# Patient Record
Sex: Male | Born: 1975 | Race: White | Hispanic: No | Marital: Married | Smoking: Current every day smoker
Health system: Southern US, Community
[De-identification: ages and names within clinical notes are randomized; demographics above are authoritative.]

## PROBLEM LIST (undated history)

## (undated) DIAGNOSIS — I2510 Atherosclerotic heart disease of native coronary artery without angina pectoris: Secondary | ICD-10-CM

## (undated) DIAGNOSIS — I5189 Other ill-defined heart diseases: Secondary | ICD-10-CM

## (undated) DIAGNOSIS — I129 Hypertensive chronic kidney disease with stage 1 through stage 4 chronic kidney disease, or unspecified chronic kidney disease: Secondary | ICD-10-CM

## (undated) DIAGNOSIS — K219 Gastro-esophageal reflux disease without esophagitis: Secondary | ICD-10-CM

## (undated) DIAGNOSIS — J4540 Moderate persistent asthma, uncomplicated: Secondary | ICD-10-CM

## (undated) DIAGNOSIS — G4730 Sleep apnea, unspecified: Secondary | ICD-10-CM

## (undated) DIAGNOSIS — E785 Hyperlipidemia, unspecified: Secondary | ICD-10-CM

## (undated) DIAGNOSIS — R053 Chronic cough: Secondary | ICD-10-CM

## (undated) DIAGNOSIS — L02215 Cutaneous abscess of perineum: Secondary | ICD-10-CM

## (undated) DIAGNOSIS — E119 Type 2 diabetes mellitus without complications: Secondary | ICD-10-CM

## (undated) DIAGNOSIS — I10 Essential (primary) hypertension: Secondary | ICD-10-CM

## (undated) DIAGNOSIS — F329 Major depressive disorder, single episode, unspecified: Secondary | ICD-10-CM

## (undated) HISTORY — DX: Essential (primary) hypertension: I10

## (undated) HISTORY — DX: Hyperlipidemia, unspecified: E78.5

## (undated) HISTORY — PX: VASECTOMY: SHX75

## (undated) HISTORY — DX: Cutaneous abscess of perineum: L02.215

## (undated) HISTORY — DX: Type 2 diabetes mellitus without complications: E11.9

## (undated) HISTORY — DX: Major depressive disorder, single episode, unspecified: F32.9

## (undated) HISTORY — DX: Sleep apnea, unspecified: G47.30

## (undated) HISTORY — PX: CHOLECYSTECTOMY: SHX55

---

## 2007-07-08 ENCOUNTER — Emergency Department: Payer: Self-pay | Admitting: Emergency Medicine

## 2010-12-20 ENCOUNTER — Ambulatory Visit: Payer: Self-pay | Admitting: Family Medicine

## 2011-12-19 ENCOUNTER — Ambulatory Visit: Payer: Self-pay | Admitting: Family Medicine

## 2014-10-05 ENCOUNTER — Other Ambulatory Visit: Payer: Self-pay | Admitting: Family Medicine

## 2014-10-05 DIAGNOSIS — E785 Hyperlipidemia, unspecified: Secondary | ICD-10-CM

## 2014-10-05 MED ORDER — CRESTOR 20 MG PO TABS: 20.0000 mg | ORAL_TABLET | Freq: Every day | ORAL | Status: DC

## 2014-10-07 ENCOUNTER — Telehealth: Payer: Self-pay

## 2014-10-07 NOTE — Telephone Encounter (Signed)
Form signed and faxed

## 2014-10-07 NOTE — Telephone Encounter (Signed)
OK form signed

## 2014-10-07 NOTE — Telephone Encounter (Signed)
Fax from [employer] requesting that patient's Crestor be written as Generic

## 2015-04-26 ENCOUNTER — Other Ambulatory Visit: Payer: Self-pay | Admitting: Family Medicine

## 2015-04-27 NOTE — Telephone Encounter (Signed)
apt 

## 2015-05-22 ENCOUNTER — Encounter: Payer: Self-pay | Admitting: Family Medicine

## 2015-07-12 ENCOUNTER — Ambulatory Visit (INDEPENDENT_AMBULATORY_CARE_PROVIDER_SITE_OTHER): Payer: Commercial Managed Care - PPO | Admitting: Family Medicine

## 2015-07-12 ENCOUNTER — Encounter: Payer: Self-pay | Admitting: Family Medicine

## 2015-07-12 VITALS — BP 115/81 | HR 111 | Temp 98.5°F | Ht 69.3 in | Wt 256.0 lb

## 2015-07-12 DIAGNOSIS — K611 Rectal abscess: Secondary | ICD-10-CM | POA: Diagnosis not present

## 2015-07-12 HISTORY — PX: INCISE AND DRAIN ABCESS: PRO64

## 2015-07-12 NOTE — Progress Notes (Signed)
   BP 115/81 mmHg  Pulse 111  Temp(Src) 98.5 F (36.9 C)  Ht 5' 9.3" (1.76 m)  Wt 256 lb (116.121 kg)  BMI 37.49 kg/m2  SpO2 96%   Subjective:    Patient ID: Casey Cole, male    DOB: [birth year], 40 y.o.   MRN: [id number]  HPI: Casey Cole is a 40 y.o. male  Chief Complaint  Patient presents with  . cyst on buttock   patient with left gluteal cheek several inches from his anus cyst which is been draining chronically over the last 3-4 weeks we'll fill up not hurt drain again and repeat  Patient with no other illness or problems has been doing well no constipation no blood in stool or urine   Relevant past medical, surgical, family and social history reviewed and updated as indicated. Interim medical history since our last visit reviewed. Allergies and medications reviewed and updated.  Review of Systems  Constitutional: Negative.   Respiratory: Negative.   Cardiovascular: Negative.     Per HPI unless specifically indicated above     Objective:    BP 115/81 mmHg  Pulse 111  Temp(Src) 98.5 F (36.9 C)  Ht 5' 9.3" (1.76 m)  Wt 256 lb (116.121 kg)  BMI 37.49 kg/m2  SpO2 96%  Wt Readings from Last 3 Encounters:  07/12/15 256 lb (116.121 kg)  08/29/14 260 lb (117.935 kg)    Physical Exam  Constitutional: He is oriented to person, place, and time. He appears well-developed and well-nourished. No distress.  HENT:  Head: Normocephalic and atraumatic.  Right Ear: Hearing normal.  Left Ear: Hearing normal.  Nose: Nose normal.  Eyes: Conjunctivae and lids are normal. Right eye exhibits no discharge. Left eye exhibits no discharge. No scleral icterus.  Pulmonary/Chest: Effort normal. No respiratory distress.  Musculoskeletal: Normal range of motion.  Neurological: He is alert and oriented to person, place, and time.  Skin: Skin is intact. No rash noted.  Gluteal abscess prepped with Betadine and alcohol and infiltrated with Xylocaine incision done with large amount of  purulent material expressed Elza Rafter inserted Patient education given on wound care will recheck tomorrow for wick removal and reinsertion.  Psychiatric: He has a normal mood and affect. His speech is normal and behavior is normal. Judgment and thought content normal. Cognition and memory are normal.    No results found for this or any previous visit.    Assessment & Plan:   Problem List Items Addressed This Visit    None       Follow up plan: Return for tomorrow.

## 2015-07-13 ENCOUNTER — Encounter: Payer: Self-pay | Admitting: Family Medicine

## 2015-07-13 ENCOUNTER — Ambulatory Visit (INDEPENDENT_AMBULATORY_CARE_PROVIDER_SITE_OTHER): Payer: Commercial Managed Care - PPO | Admitting: Family Medicine

## 2015-07-13 VITALS — BP 114/76 | HR 118 | Temp 98.1°F | Ht 68.3 in | Wt 256.0 lb

## 2015-07-13 DIAGNOSIS — K611 Rectal abscess: Secondary | ICD-10-CM

## 2015-07-13 NOTE — Progress Notes (Signed)
   BP 114/76 mmHg  Pulse 118  Temp(Src) 98.1 F (36.7 C)  Ht 5' 8.3" (1.735 m)  Wt 256 lb (116.121 kg)  BMI 38.58 kg/m2   Subjective:    Patient ID: Casey Cole, male    DOB: 02-28-1976, 40 y.o.   MRN: [id number]  HPI: Casey Cole is a 40 y.o. male  Chief Complaint  Patient presents with  . wick change   Patient with good drainage wick removed and reinserted no increased redness or irritation  Relevant past medical, surgical, family and social history reviewed and updated as indicated. Interim medical history since our last visit reviewed. Allergies and medications reviewed and updated.  Review of Systems  Per HPI unless specifically indicated above     Objective:    BP 114/76 mmHg  Pulse 118  Temp(Src) 98.1 F (36.7 C)  Ht 5' 8.3" (1.735 m)  Wt 256 lb (116.121 kg)  BMI 38.58 kg/m2  Wt Readings from Last 3 Encounters:  07/13/15 256 lb (116.121 kg)  07/12/15 256 lb (116.121 kg)  08/29/14 260 lb (117.935 kg)    Physical Exam  No results found for this or any previous visit.    Assessment & Plan:   Problem List Items Addressed This Visit      Other   Perirectal abscess - Primary    Abscess improving good drainage wick removed will recheck tomorrow          Follow up plan: Return for Tomorrow wick removal and further evaluation.

## 2015-07-13 NOTE — Assessment & Plan Note (Signed)
Abscess improving good drainage wick removed will recheck tomorrow

## 2015-07-14 ENCOUNTER — Ambulatory Visit: Payer: Commercial Managed Care - PPO | Admitting: Family Medicine

## 2015-07-14 ENCOUNTER — Ambulatory Visit (INDEPENDENT_AMBULATORY_CARE_PROVIDER_SITE_OTHER): Payer: Commercial Managed Care - PPO | Admitting: Family Medicine

## 2015-07-14 ENCOUNTER — Encounter: Payer: Self-pay | Admitting: Family Medicine

## 2015-07-14 VITALS — BP 124/84 | HR 118 | Temp 98.3°F | Wt 262.0 lb

## 2015-07-14 DIAGNOSIS — K611 Rectal abscess: Secondary | ICD-10-CM

## 2015-07-14 NOTE — Progress Notes (Signed)
Elza Rafter changed today. No sign of redness or swelling, no pus able to be expressed. Call if getting red or painful. Will not need wick change over the weekend.

## 2015-07-17 ENCOUNTER — Telehealth: Payer: Self-pay | Admitting: Family Medicine

## 2015-07-17 DIAGNOSIS — K611 Rectal abscess: Secondary | ICD-10-CM

## 2015-07-17 NOTE — Telephone Encounter (Signed)
Call pt 

## 2015-07-17 NOTE — Telephone Encounter (Signed)
Pt says his infection isn't any better and would like to have an antibiotice sent to [facility].

## 2015-07-17 NOTE — Telephone Encounter (Signed)
Phone call Patient still with pus draining from abscess site and perirectal area  Concerned might be rectal fistula We will make an appointment with surgery needs to be morning if it's tomorrow or Wednesday otherwise on Thursday all day is open.

## 2015-07-18 ENCOUNTER — Ambulatory Visit (INDEPENDENT_AMBULATORY_CARE_PROVIDER_SITE_OTHER): Payer: Commercial Managed Care - PPO | Admitting: General Surgery

## 2015-07-18 ENCOUNTER — Encounter: Payer: Self-pay | Admitting: General Surgery

## 2015-07-18 VITALS — BP 128/74 | HR 76 | Resp 12 | Ht 68.0 in | Wt 263.0 lb

## 2015-07-18 DIAGNOSIS — L0231 Cutaneous abscess of buttock: Secondary | ICD-10-CM | POA: Diagnosis not present

## 2015-07-18 MED ORDER — AMOXICILLIN-POT CLAVULANATE 875-125 MG PO TABS: 1.0000 | ORAL_TABLET | Freq: Two times a day (BID) | ORAL | Status: AC

## 2015-07-18 NOTE — Patient Instructions (Signed)
The patient is aware to call back for any questions or concerns.  

## 2015-07-18 NOTE — Progress Notes (Signed)
Patient ID: Casey Cole, male   DOB: 29-Jul-1975, 40 y.o.   MRN: [id number]  Chief Complaint  Patient presents with  . Rectal Problems    abscess    HPI Casey Cole is a 40 y.o. male.  Here today for evaluation of a possible perirectal abscess. He states it is the left gluteal cheek and has been draining on and off for the last 4 weeks. He had an incison and drainage with wick inserted by Dr Jeananne Rama on 07-12-15.  I reviewed the patient's history.   HPI  Past Medical History  Diagnosis Date  . Sleep apnea   . Diabetes mellitus without complication ([place])   . Hyperlipidemia   . Hypertension     Past Surgical History  Procedure Laterality Date  . Cholecystectomy    . Incise and drain abcess Left 07-12-15    left buttock    Family History  Problem Relation Age of Onset  . Stroke Paternal Grandfather   . Heart attack Paternal Grandfather   . Hypertension Father     Social History Social History  Substance Use Topics  . Smoking status: Current Every Day Smoker -- 1.00 packs/day    Types: Cigarettes  . Smokeless tobacco: Never Used  . Alcohol Use: Yes    No Known Allergies  Current Outpatient Prescriptions  Medication Sig Dispense Refill  . benazepril (LOTENSIN) 20 MG tablet Take 20 mg by mouth daily.    . Dapagliflozin-Metformin HCl ER (XIGDUO XR) 02-999 MG TB24 Take by mouth.    . ONE TOUCH ULTRA TEST test strip USE AS DIRECTED ONCE DAILY 100 each 12  . amoxicillin-clavulanate (AUGMENTIN) 875-125 MG tablet Take 1 tablet by mouth every 12 (twelve) hours. 20 tablet 0   No current facility-administered medications for this visit.    Review of Systems Review of Systems  Constitutional: Negative.   Respiratory: Negative.   Cardiovascular: Negative.     Blood pressure 128/74, pulse 76, resp. rate 12, height 5\' 8"  (1.727 m), weight 263 lb (119.296 kg).  Physical Exam Physical Exam  Genitourinary:       Data Reviewed PCP notes of earlier this  month.   Assessment    Gluteal abscess, low suspicion for anal fistula at this time.     Plan    Augmentin, 875 BID x 10 days.  Yogurt or probiotic daily.  Hot compress/ heating pad. Encouraged to take oral hypoglycemic meds as instructed.  Re-apply for CPAP coverage. Will consider exam under anesthesia if the area does not resolve.      PCP:  Golden Pop A This information has been scribed by Karie Fetch RNBC.   Robert Bellow 07/18/2015, 9:25 PM

## 2015-08-23 ENCOUNTER — Encounter: Payer: Self-pay | Admitting: General Surgery

## 2015-08-23 ENCOUNTER — Observation Stay
Admission: AD | Admit: 2015-08-23 | Discharge: 2015-08-24 | Disposition: A | Discharge status: Home / Self Care | Payer: Commercial Managed Care - PPO | Source: Ambulatory Visit | Attending: General Surgery | Admitting: General Surgery

## 2015-08-23 ENCOUNTER — Encounter: Payer: Self-pay | Admitting: *Deleted

## 2015-08-23 ENCOUNTER — Observation Stay: Payer: Commercial Managed Care - PPO | Admitting: Anesthesiology

## 2015-08-23 ENCOUNTER — Ambulatory Visit (INDEPENDENT_AMBULATORY_CARE_PROVIDER_SITE_OTHER): Payer: Commercial Managed Care - PPO | Admitting: General Surgery

## 2015-08-23 ENCOUNTER — Encounter
Admission: AD | Disposition: A | Discharge status: Home / Self Care | Payer: Self-pay | Source: Ambulatory Visit | Attending: General Surgery

## 2015-08-23 VITALS — BP 134/70 | HR 90 | Temp 96.5°F | Resp 16 | Ht 69.0 in | Wt 261.0 lb

## 2015-08-23 DIAGNOSIS — Z9889 Other specified postprocedural states: Secondary | ICD-10-CM | POA: Insufficient documentation

## 2015-08-23 DIAGNOSIS — F17210 Nicotine dependence, cigarettes, uncomplicated: Secondary | ICD-10-CM | POA: Diagnosis not present

## 2015-08-23 DIAGNOSIS — L02215 Cutaneous abscess of perineum: Secondary | ICD-10-CM

## 2015-08-23 DIAGNOSIS — Z823 Family history of stroke: Secondary | ICD-10-CM | POA: Diagnosis not present

## 2015-08-23 DIAGNOSIS — L0231 Cutaneous abscess of buttock: Secondary | ICD-10-CM | POA: Diagnosis not present

## 2015-08-23 DIAGNOSIS — Z79899 Other long term (current) drug therapy: Secondary | ICD-10-CM | POA: Insufficient documentation

## 2015-08-23 DIAGNOSIS — Z9049 Acquired absence of other specified parts of digestive tract: Secondary | ICD-10-CM | POA: Diagnosis not present

## 2015-08-23 DIAGNOSIS — K602 Anal fissure, unspecified: Secondary | ICD-10-CM | POA: Diagnosis not present

## 2015-08-23 DIAGNOSIS — Z8249 Family history of ischemic heart disease and other diseases of the circulatory system: Secondary | ICD-10-CM | POA: Insufficient documentation

## 2015-08-23 DIAGNOSIS — E119 Type 2 diabetes mellitus without complications: Secondary | ICD-10-CM | POA: Insufficient documentation

## 2015-08-23 DIAGNOSIS — E785 Hyperlipidemia, unspecified: Secondary | ICD-10-CM | POA: Diagnosis not present

## 2015-08-23 DIAGNOSIS — G4730 Sleep apnea, unspecified: Secondary | ICD-10-CM | POA: Insufficient documentation

## 2015-08-23 DIAGNOSIS — I10 Essential (primary) hypertension: Secondary | ICD-10-CM | POA: Insufficient documentation

## 2015-08-23 DIAGNOSIS — Z7984 Long term (current) use of oral hypoglycemic drugs: Secondary | ICD-10-CM | POA: Insufficient documentation

## 2015-08-23 DIAGNOSIS — K611 Rectal abscess: Secondary | ICD-10-CM

## 2015-08-23 HISTORY — DX: Cutaneous abscess of perineum: L02.215

## 2015-08-23 HISTORY — PX: INCISION AND DRAINAGE PERIRECTAL ABSCESS: SHX1804

## 2015-08-23 LAB — CBC
HEMATOCRIT: 51 % (ref 40.0–52.0)
Hemoglobin: 17.7 g/dL (ref 13.0–18.0)
MCH: 30.7 pg (ref 26.0–34.0)
MCHC: 34.6 g/dL (ref 32.0–36.0)
MCV: 88.5 fL (ref 80.0–100.0)
PLATELETS: 169 10*3/uL (ref 150–440)
RBC: 5.76 MIL/uL (ref 4.40–5.90)
RDW: 13.3 % (ref 11.5–14.5)
WBC: 12.4 10*3/uL — ABNORMAL HIGH (ref 3.8–10.6)

## 2015-08-23 LAB — BASIC METABOLIC PANEL
Anion gap: 6 (ref 5–15)
BUN: 11 mg/dL (ref 6–20)
CALCIUM: 9.4 mg/dL (ref 8.9–10.3)
CO2: 28 mmol/L (ref 22–32)
CREATININE: 0.99 mg/dL (ref 0.61–1.24)
Chloride: 102 mmol/L (ref 101–111)
GLUCOSE: 212 mg/dL — AB (ref 65–99)
Potassium: 4.1 mmol/L (ref 3.5–5.1)
Sodium: 136 mmol/L (ref 135–145)

## 2015-08-23 LAB — GLUCOSE, CAPILLARY: Glucose-Capillary: 188 mg/dL — ABNORMAL HIGH (ref 65–99)

## 2015-08-23 LAB — SURGICAL PCR SCREEN
MRSA, PCR: NEGATIVE
Staphylococcus aureus: POSITIVE — AB

## 2015-08-23 SURGERY — INCISION AND DRAINAGE, ABSCESS, PERIRECTAL
Anesthesia: General | Site: Rectum | Wound class: Dirty or Infected

## 2015-08-23 MED ORDER — DEXTROSE 5 % IV SOLN
2.0000 g | Freq: Once | INTRAVENOUS | Status: AC
  Administered 2015-08-23: 2 g via INTRAVENOUS
  Filled 2015-08-23: qty 2

## 2015-08-23 MED ORDER — MORPHINE SULFATE (PF) 2 MG/ML IV SOLN
2.0000 mg | INTRAVENOUS | Status: DC | PRN
Start: 2015-08-23 — End: 2015-08-23

## 2015-08-23 MED ORDER — ACETAMINOPHEN 650 MG RE SUPP
650.0000 mg | Freq: Four times a day (QID) | RECTAL | Status: DC | PRN
  Filled 2015-08-23: qty 1

## 2015-08-23 MED ORDER — ONDANSETRON HCL 4 MG/2ML IJ SOLN: 4.0000 mg | Freq: Once | INTRAMUSCULAR | Status: DC | PRN

## 2015-08-23 MED ORDER — MIDAZOLAM HCL 2 MG/2ML IJ SOLN
INTRAMUSCULAR | Status: DC | PRN
Start: 2015-08-23 — End: 2015-08-23
  Administered 2015-08-23 (×2): 1 mg via INTRAVENOUS

## 2015-08-23 MED ORDER — ACETAMINOPHEN 325 MG PO TABS: 650.0000 mg | ORAL_TABLET | ORAL | Status: DC | PRN

## 2015-08-23 MED ORDER — LACTATED RINGERS IV SOLN
INTRAVENOUS | Status: DC | PRN
Start: 2015-08-23 — End: 2015-08-23
  Administered 2015-08-23: 19:00:00 via INTRAVENOUS

## 2015-08-23 MED ORDER — LACTATED RINGERS IV SOLN
INTRAVENOUS | Status: DC
  Administered 2015-08-23: 11:00:00 via INTRAVENOUS

## 2015-08-23 MED ORDER — FENTANYL CITRATE (PF) 100 MCG/2ML IJ SOLN: 25.0000 ug | INTRAMUSCULAR | Status: DC | PRN

## 2015-08-23 MED ORDER — ERTAPENEM SODIUM 1 G IJ SOLR
1.0000 g | Freq: Every day | INTRAMUSCULAR | Status: DC
  Administered 2015-08-23 – 2015-08-24 (×2): 1 g via INTRAVENOUS
  Filled 2015-08-23 (×2): qty 1

## 2015-08-23 MED ORDER — ONDANSETRON HCL 4 MG PO TABS: 4.0000 mg | ORAL_TABLET | Freq: Four times a day (QID) | ORAL | Status: DC | PRN

## 2015-08-23 MED ORDER — SUCCINYLCHOLINE CHLORIDE 20 MG/ML IJ SOLN
INTRAMUSCULAR | Status: DC | PRN
  Administered 2015-08-23: 100 mg via INTRAVENOUS

## 2015-08-23 MED ORDER — BUPIVACAINE-EPINEPHRINE 0.5% -1:200000 IJ SOLN
INTRAMUSCULAR | Status: DC | PRN
  Administered 2015-08-23: 30 mL

## 2015-08-23 MED ORDER — HYDROCODONE-ACETAMINOPHEN 5-325 MG PO TABS
1.0000 | ORAL_TABLET | ORAL | Status: DC | PRN
  Administered 2015-08-23: 2 via ORAL
  Filled 2015-08-23: qty 2

## 2015-08-23 MED ORDER — HYDROCODONE-ACETAMINOPHEN 5-325 MG PO TABS: 1.0000 | ORAL_TABLET | ORAL | Status: DC | PRN

## 2015-08-23 MED ORDER — FENTANYL CITRATE (PF) 100 MCG/2ML IJ SOLN
25.0000 ug | INTRAMUSCULAR | Status: DC | PRN
  Administered 2015-08-23: 25 ug via INTRAVENOUS

## 2015-08-23 MED ORDER — BENAZEPRIL HCL 20 MG PO TABS
20.0000 mg | ORAL_TABLET | Freq: Every day | ORAL | Status: DC
  Administered 2015-08-23 – 2015-08-24 (×2): 20 mg via ORAL
  Filled 2015-08-23 (×2): qty 1

## 2015-08-23 MED ORDER — PHENYLEPHRINE HCL 10 MG/ML IJ SOLN
INTRAMUSCULAR | Status: DC | PRN
  Administered 2015-08-23 (×2): 100 ug via INTRAVENOUS

## 2015-08-23 MED ORDER — MORPHINE SULFATE (PF) 2 MG/ML IV SOLN: 2.0000 mg | INTRAVENOUS | Status: DC | PRN

## 2015-08-23 MED ORDER — ONDANSETRON HCL 4 MG/2ML IJ SOLN: 4.0000 mg | Freq: Four times a day (QID) | INTRAMUSCULAR | Status: DC | PRN

## 2015-08-23 MED ORDER — PROPOFOL 10 MG/ML IV BOLUS
INTRAVENOUS | Status: DC | PRN
  Administered 2015-08-23: 200 mg via INTRAVENOUS

## 2015-08-23 MED ORDER — FENTANYL CITRATE (PF) 100 MCG/2ML IJ SOLN
INTRAMUSCULAR | Status: DC | PRN
  Administered 2015-08-23 (×2): 50 ug via INTRAVENOUS

## 2015-08-23 SURGICAL SUPPLY — 31 items
BLADE SURG 11 STRL SS SAFETY (MISCELLANEOUS) ×2 IMPLANT
BRIEF STRETCH MATERNITY 2XLG (MISCELLANEOUS) ×2 IMPLANT
CANISTER SUCT 1200ML W/VALVE (MISCELLANEOUS) ×2 IMPLANT
DRAIN PENROSE 5/8X18 LTX STRL (WOUND CARE) ×2 IMPLANT
DRAPE LAPAROTOMY 100X77 ABD (DRAPES) ×2 IMPLANT
DRAPE LEGGINS SURG 28X43 STRL (DRAPES) ×2 IMPLANT
DRAPE UNDER BUTTOCK W/FLU (DRAPES) ×2 IMPLANT
ELECT REM PT RETURN 9FT ADLT (ELECTROSURGICAL) ×2
ELECTRODE REM PT RTRN 9FT ADLT (ELECTROSURGICAL) ×1 IMPLANT
GLOVE BIO SURGEON STRL SZ7.5 (GLOVE) ×2 IMPLANT
GLOVE INDICATOR 8.0 STRL GRN (GLOVE) ×2 IMPLANT
GOWN STRL REUS W/ TWL LRG LVL3 (GOWN DISPOSABLE) ×2 IMPLANT
GOWN STRL REUS W/TWL LRG LVL3 (GOWN DISPOSABLE) ×2
KIT RM TURNOVER STRD PROC AR (KITS) ×2 IMPLANT
LABEL OR SOLS (LABEL) ×2 IMPLANT
NEEDLE HYPO 25X1 1.5 SAFETY (NEEDLE) ×2 IMPLANT
NS IRRIG 500ML POUR BTL (IV SOLUTION) ×2 IMPLANT
PACK BASIN MINOR ARMC (MISCELLANEOUS) ×2 IMPLANT
PAD OB MATERNITY 4.3X12.25 (PERSONAL CARE ITEMS) ×2 IMPLANT
PAD PREP 24X41 OB/GYN DISP (PERSONAL CARE ITEMS) ×2 IMPLANT
SOL PREP PVP 2OZ (MISCELLANEOUS) ×2
SOLUTION PREP PVP 2OZ (MISCELLANEOUS) ×1 IMPLANT
SURGILUBE 2OZ TUBE FLIPTOP (MISCELLANEOUS) ×2 IMPLANT
SUT SILK 0 SH 30 (SUTURE) IMPLANT
SUT SILK 3 0 (SUTURE) ×1
SUT SILK 3-0 18XBRD TIE 12 (SUTURE) ×1 IMPLANT
SUT VIC AB 3-0 SH 27 (SUTURE) ×1
SUT VIC AB 3-0 SH 27X BRD (SUTURE) ×1 IMPLANT
SWAB CULTURE AMIES ANAERIB BLU (MISCELLANEOUS) ×2 IMPLANT
SYR BULB IRRIG 60ML STRL (SYRINGE) ×2 IMPLANT
SYR CONTROL 10ML (SYRINGE) ×2 IMPLANT

## 2015-08-23 NOTE — Progress Notes (Signed)
Inpatient Diabetes Program Recommendations  AACE/ADA: New Consensus Statement on Inpatient Glycemic Control (2015)  Target Ranges:  Prepandial:   less than 140 mg/dL      Peak postprandial:   less than 180 mg/dL (1-2 hours)      Critically ill patients:  140 - 180 mg/dL  Results for Casey Cole, Casey Cole (MRN [id number]) as of 08/23/2015 12:37  Ref. Range 08/23/2015 11:07  Glucose Latest Ref Range: 65-99 mg/dL 212 (H)   Review of Glycemic Control  Diabetes history: DM2 Outpatient Diabetes medications: Xigduo XR 02-999 mg daily Current orders for Inpatient glycemic control: None  Inpatient Diabetes Program Recommendations: Correction (SSI): While inpatient, please consider ordering CBGs with Novolog correction scale ACHS using Glycemic Control order set. HgbA1C: Please consider ordering an A1C to evaluate glycemic control over the past 2-3 months.  Thanks, Barnie Alderman, RN, MSN, CDE Diabetes Coordinator Inpatient Diabetes Program [phone] (Team Pager from [place] to [place]) [phone] (AP office) [phone] [facility] office) [phone] [facility] office)

## 2015-08-23 NOTE — Op Note (Signed)
Preoperative diagnosis: Left perineal abscess.  Postoperative diagnosis: Same.  Operative procedure: Exam under anesthesia, anoscopy, incision and drainage of perineal abscess.  Operating surgeon: Ollen Bowl, M.D.   Anesthesia: Gen. endotracheal. Marcaine 0.5% with 1-200,000 epinephrine, 30 mL local infiltration.  Estimated blood loss: Less than 5 mL.  Clinical note: This 40 year old diabetic male has developed a recurrent perineal abscess with extension into the base of the scrotum. He is brought to the operative for exam under anesthesia and incision and drainage.  Operative note: With the patient under adequate general endotracheal anesthesia the perineum was prepped with Betadine solution and draped. Marcaine was infiltrated for postoperative analgesia. The drainage site at the 2:00 position approximate 5 cm from the anus (dorsal lithotomy) was probed and shown to extend anteriorly and superiorly to the base of the scrotum. A counterincision was made at this point. The wound was irrigated with saline with minimal purulence noted. Culture was obtained. A curet was used to clear the tract between the site of spontaneous drainage and the incision at the base of the scrotum. A Blake drain was brought through this area and anchored one into the other with 3-0 nylon. Anoscopy was undertaken. There was a small anterior fissure. Probing gently with the lacrimal duct probe showed no evidence of continuity between the abscess noted above and the anorectal junction. Digital rectal exam was unremarkable.  Dry dressing was applied and the patient taken to recovery in stable condition.

## 2015-08-23 NOTE — Progress Notes (Signed)
Brief note: Extensive perineal abscess for I&D today.  Breakfast at 6:30 AM. For OR this PM.

## 2015-08-23 NOTE — Anesthesia Procedure Notes (Signed)
Procedure Name: Intubation Date/Time: 08/23/2015 7:18 PM Performed by: Lendon Colonel Pre-anesthesia Checklist: Emergency Drugs available, Patient identified, Suction available, Patient being monitored and Timeout performed Patient Re-evaluated:Patient Re-evaluated prior to inductionOxygen Delivery Method: Circle system utilized Intubation Type: IV induction and Cricoid Pressure applied Ventilation: Mask ventilation with difficulty Laryngoscope Size: Miller and 2 Grade View: Grade III Number of attempts: 2 Airway Equipment and Method: Stylet Placement Confirmation: ETT inserted through vocal cords under direct vision,  positive ETCO2 and breath sounds checked- equal and bilateral Secured at: 21 cm Tube secured with: Tape Dental Injury: Teeth and Oropharynx as per pre-operative assessment  Difficulty Due To: Difficulty was anticipated and Difficult Airway- due to anterior larynx Future Recommendations: Recommend- induction with short-acting agent, and alternative techniques readily available

## 2015-08-23 NOTE — Patient Instructions (Signed)
The patient is aware to call back for any questions or concerns.  

## 2015-08-23 NOTE — Anesthesia Preprocedure Evaluation (Addendum)
Anesthesia Evaluation  Patient identified by MRN, date of birth, ID band Patient awake    Reviewed: Allergy & Precautions, NPO status , Patient's Chart, lab work & pertinent test results, reviewed documented beta blocker date and time   Airway Mallampati: II  TM Distance: >3 FB Neck ROM: Full   Comment: Large neck...possible anterior airway Dental  (+) Chipped   Pulmonary sleep apnea , Current Smoker,  Recent URI   Pulmonary exam normal        Cardiovascular hypertension, Pt. on medications and Pt. on home beta blockers Normal cardiovascular exam     Neuro/Psych negative neurological ROS  negative psych ROS   GI/Hepatic Neg liver ROS, Perirectal abcess   Endo/Other  diabetes, Well Controlled, Type 2, Oral Hypoglycemic Agents  Renal/GU negative Renal ROS  negative genitourinary   Musculoskeletal negative musculoskeletal ROS (+)   Abdominal (+) + obese,   Peds negative pediatric ROS (+)  Hematology negative hematology ROS (+)   Anesthesia Other Findings   Reproductive/Obstetrics                          Anesthesia Physical Anesthesia Plan  ASA: III and emergent  Anesthesia Plan: General   Post-op Pain Management:    Induction: Intravenous  Airway Management Planned: Oral ETT  Additional Equipment:   Intra-op Plan:   Post-operative Plan: Extubation in OR  Informed Consent: I have reviewed the patients History and Physical, chart, labs and discussed the procedure including the risks, benefits and alternatives for the proposed anesthesia with the patient or authorized representative who has indicated his/her understanding and acceptance.   Dental advisory given  Plan Discussed with: CRNA and Surgeon  Anesthesia Plan Comments:        Anesthesia Quick Evaluation

## 2015-08-23 NOTE — H&P (Signed)
Casey Cole is an 40 y.o. male.   Chief Complaint: Perineal pain, swelling and drainage. HPI: The patient was seen on 07/18/2015 regarding a resolving perianal infection. Examination at that time was negative for fistula. He reported he was doing well until 3 days ago when he noticed recurrent pain and swelling that rapidly his progress over last 24 hours. He denies any fever or chills. He has noticed increased swelling extending towards the base of the scrotum.  Past Medical History  Diagnosis Date  . Sleep apnea   . Diabetes mellitus without complication ([place])   . Hyperlipidemia   . Hypertension     Past Surgical History  Procedure Laterality Date  . Cholecystectomy    . Incise and drain abcess Left 07-12-15    left buttock    Family History  Problem Relation Age of Onset  . Stroke Paternal Grandfather   . Heart attack Paternal Grandfather   . Hypertension Father    Social History:  reports that he has been smoking Cigarettes.  He has been smoking about 1.00 pack per day. He has never used smokeless tobacco. He reports that he drinks about 1.8 oz of alcohol per week. He reports that he does not use illicit drugs.  Allergies: No Known Allergies  Medications Prior to Admission  Medication Sig Dispense Refill  . Dapagliflozin-Metformin HCl ER (XIGDUO XR) 02-999 MG TB24 Take by mouth.    . benazepril (LOTENSIN) 20 MG tablet Take 20 mg by mouth daily. Reported on 08/23/2015    . metoprolol succinate (TOPROL-XL) 25 MG 24 hr tablet Take 25 mg by mouth daily.    . ONE TOUCH ULTRA TEST test strip USE AS DIRECTED ONCE DAILY 100 each 12    Results for orders placed or performed during the hospital encounter of 08/23/15 (from the past 48 hour(s))  CBC     Status: Abnormal   Collection Time: 08/23/15 11:07 AM  Result Value Ref Range   WBC 12.4 (H) 3.8 - 10.6 K/uL   RBC 5.76 4.40 - 5.90 MIL/uL   Hemoglobin 17.7 13.0 - 18.0 g/dL   HCT 51.0 40.0 - 52.0 %   MCV 88.5 80.0 - 100.0 fL   MCH 30.7 26.0 - 34.0 pg   MCHC 34.6 32.0 - 36.0 g/dL   RDW 13.3 11.5 - 14.5 %   Platelets 169 150 - 440 K/uL  Basic metabolic panel     Status: Abnormal   Collection Time: 08/23/15 11:07 AM  Result Value Ref Range   Sodium 136 135 - 145 mmol/L   Potassium 4.1 3.5 - 5.1 mmol/L   Chloride 102 101 - 111 mmol/L   CO2 28 22 - 32 mmol/L   Glucose, Bld 212 (H) 65 - 99 mg/dL   BUN 11 6 - 20 mg/dL   Creatinine, Ser 0.99 0.61 - 1.24 mg/dL   Calcium 9.4 8.9 - 10.3 mg/dL   GFR calc non Af Amer >60 >60 mL/min   GFR calc Af Amer >60 >60 mL/min    Comment: (NOTE) The eGFR has been calculated using the CKD EPI equation. This calculation has not been validated in all clinical situations. eGFR's persistently <60 mL/min signify possible Chronic Kidney Disease.    Anion gap 6 5 - 15  Surgical PCR screen     Status: Abnormal   Collection Time: 08/23/15 11:18 AM  Result Value Ref Range   MRSA, PCR NEGATIVE NEGATIVE   Staphylococcus aureus POSITIVE (A) NEGATIVE    Comment:  The Xpert SA Assay (FDA approved for NASAL specimens in patients over 35 years of age), is one component of a comprehensive surveillance program.  Test performance has been validated by [facility] for patients greater than or equal to 32 year old. It is not intended to diagnose infection nor to guide or monitor treatment.    No results found.  Review of Systems  Constitutional: Negative for fever and chills.  HENT: Negative.   Eyes: Negative.   Respiratory: Negative.   Cardiovascular: Negative.   Gastrointestinal: Negative.   Genitourinary: Negative for urgency.  Musculoskeletal: Negative.   Skin: Negative.   Neurological: Negative.   Endo/Heme/Allergies: Negative.   Psychiatric/Behavioral: Negative.     Blood pressure 125/96, pulse 99, temperature 97.7 F (36.5 C), temperature source Oral, resp. rate 18, height '5\' 9"'$  (1.753 m), weight 258 lb 12.8 oz (117.391 kg), SpO2 97 %. Physical Exam    Constitutional: He is oriented to person, place, and time. He appears well-developed and well-nourished.  HENT:  Head: Atraumatic.  Eyes: Pupils are equal, round, and reactive to light.  Neck: Neck supple. No thyromegaly present.  Cardiovascular: Normal rate, regular rhythm and normal heart sounds.   Respiratory: Effort normal and breath sounds normal.  GI: Soft.  Genitourinary:     Musculoskeletal: Normal range of motion.  Lymphadenopathy:       Right: No inguinal adenopathy present.       Left: No inguinal adenopathy present.  Neurological: He is alert and oriented to person, place, and time.  Skin: Skin is warm and dry.  Psychiatric: He has a normal mood and affect. His behavior is normal. Judgment and thought content normal.     Assessment/Plan Peritoneal abscess, possible anorectal source.  Plan: Exam under anesthesia. As the patient had breakfast, surgery will be postponed until this evening.  Robert Bellow, MD 08/23/2015, 12:52 PM

## 2015-08-23 NOTE — Transfer of Care (Signed)
Immediate Anesthesia Transfer of Care Note  Patient: Casey Cole  Procedure(s) Performed: Procedure(s): IRRIGATION AND DEBRIDEMENT PERIRECTAL ABSCESS (N/A)  Patient Location: PACU  Anesthesia Type:General  Level of Consciousness: awake, alert , oriented and patient cooperative  Airway & Oxygen Therapy: Patient Spontanous Breathing  Post-op Assessment: Report given to RN and Post -op Vital signs reviewed and stable  Post vital signs: Reviewed and stable  Last Vitals:  Filed Vitals:   08/23/15 1031 08/23/15 1244  BP: 140/87 125/96  Pulse: 116 99  Temp: 36.6 C 36.5 C  Resp: 18 18    Complications: No apparent anesthesia complications

## 2015-08-23 NOTE — Progress Notes (Signed)
Patient ID: Casey Cole, male   DOB: [birth year], 40 y.o.   MRN: [id number]  Chief Complaint  Patient presents with  . Follow-up    peri rectal abscess    HPI Casey Cole is a 40 y.o. male.  Here today for evaluation of a possible perirectal abscess. He was here for similar complaints March 17. He states it seemed to start bothering him again Easter Weekend.  He states it is the left gluteal cheek and has been draining on and off  Bowels are regular and daily.  HPI  Past Medical History  Diagnosis Date  . Sleep apnea   . Diabetes mellitus without complication ([place])   . Hyperlipidemia   . Hypertension     Past Surgical History  Procedure Laterality Date  . Cholecystectomy    . Incise and drain abcess Left 07-12-15    left buttock  . Incision and drainage perirectal abscess N/A 08/23/2015    Procedure: IRRIGATION AND DEBRIDEMENT PERIRECTAL ABSCESS;  Surgeon: Robert Bellow, MD;  Location: [facility] ORS;  Service: General;  Laterality: N/A;  Anal Scope    Family History  Problem Relation Age of Onset  . Stroke Paternal Grandfather   . Heart attack Paternal Grandfather   . Hypertension Father     Social History Social History  Substance Use Topics  . Smoking status: Current Every Day Smoker -- 1.00 packs/day    Types: Cigarettes  . Smokeless tobacco: Never Used  . Alcohol Use: 1.8 oz/week    3 Shots of liquor per week    No Known Allergies  Current Outpatient Prescriptions  Medication Sig Dispense Refill  . benazepril (LOTENSIN) 20 MG tablet Take 20 mg by mouth daily. Reported on 08/23/2015    . ONE TOUCH ULTRA TEST test strip USE AS DIRECTED ONCE DAILY 100 each 12  . amoxicillin-clavulanate (AUGMENTIN) 875-125 MG tablet Take 1 tablet by mouth every 12 (twelve) hours. 14 tablet 0  . HYDROcodone-acetaminophen (NORCO/VICODIN) 5-325 MG tablet Take 1-2 tablets by mouth every 4 (four) hours as needed for moderate pain. 30 tablet 0  . metoprolol succinate (TOPROL-XL) 25 MG 24  hr tablet Take 25 mg by mouth daily.    Marland Kitchen XIGDUO XR 02-999 MG TB24 TAKE 1 TABLET BY MOUTH DAILY 30 tablet 0   No current facility-administered medications for this visit.    Review of Systems Review of Systems  Constitutional: Negative.   Respiratory: Negative.   Cardiovascular: Negative.     Blood pressure 134/70, pulse 90, temperature 96.5 F (35.8 C), temperature source Oral, resp. rate 16, height 5\' 9"  (1.753 m), weight 261 lb (118.389 kg).  Physical Exam Physical Exam  Constitutional: He is oriented to person, place, and time. He appears well-developed and well-nourished.  HENT:  Mouth/Throat: Oropharynx is clear and moist.  Eyes: Conjunctivae are normal. No scleral icterus.  Neck: Neck supple.  Cardiovascular: Normal rate, regular rhythm and normal heart sounds.   Pulmonary/Chest: Effort normal and breath sounds normal.  Abdominal: Soft.  Genitourinary:     Area of thickening at left anterior gluteal tracking to testicle  Lymphadenopathy:    He has no cervical adenopathy.  Neurological: He is alert and oriented to person, place, and time.  Skin: Skin is warm and dry.  Psychiatric: His behavior is normal.     Assessment    Perineal abscess requiring operative drainage.    Plan    Patient will be directly admitted to [facility] today. We will plan for surgery later on  this evening.     PCP:  Golden Pop This information has been scribed by Karie Fetch RN, BSN,BC.     Robert Bellow 08/24/2015, 5:29 PM

## 2015-08-24 ENCOUNTER — Other Ambulatory Visit: Payer: Self-pay | Admitting: Family Medicine

## 2015-08-24 ENCOUNTER — Encounter: Payer: Self-pay | Admitting: General Surgery

## 2015-08-24 DIAGNOSIS — L02215 Cutaneous abscess of perineum: Secondary | ICD-10-CM | POA: Diagnosis not present

## 2015-08-24 LAB — GLUCOSE, CAPILLARY: GLUCOSE-CAPILLARY: 207 mg/dL — AB (ref 65–99)

## 2015-08-24 MED ORDER — HYDROCODONE-ACETAMINOPHEN 5-325 MG PO TABS: 1.0000 | ORAL_TABLET | ORAL | Status: DC | PRN

## 2015-08-24 MED ORDER — AMOXICILLIN-POT CLAVULANATE 875-125 MG PO TABS: 1.0000 | ORAL_TABLET | Freq: Two times a day (BID) | ORAL | Status: DC

## 2015-08-24 MED ORDER — AMOXICILLIN-POT CLAVULANATE 875-125 MG PO TABS
1.0000 | ORAL_TABLET | Freq: Two times a day (BID) | ORAL | Status: DC
  Administered 2015-08-24: 1 via ORAL
  Filled 2015-08-24 (×2): qty 1

## 2015-08-24 NOTE — Progress Notes (Signed)
08/24/2015 10:58 AM  Casey Cole to be D/C'd Home per MD order.  Discussed prescriptions and follow up appointments with the patient. Prescriptions given to patient, medication list explained in detail. Pt verbalized understanding.    Medication List    TAKE these medications        amoxicillin-clavulanate 875-125 MG tablet  Commonly known as:  AUGMENTIN  Take 1 tablet by mouth every 12 (twelve) hours.     benazepril 20 MG tablet  Commonly known as:  LOTENSIN  Take 20 mg by mouth daily. Reported on 08/23/2015     HYDROcodone-acetaminophen 5-325 MG tablet  Commonly known as:  NORCO/VICODIN  Take 1-2 tablets by mouth every 4 (four) hours as needed for moderate pain.     metoprolol succinate 25 MG 24 hr tablet  Commonly known as:  TOPROL-XL  Take 25 mg by mouth daily.     ONE TOUCH ULTRA TEST test strip  Generic drug:  glucose blood  USE AS DIRECTED ONCE DAILY     XIGDUO XR 02-999 MG Tb24  Generic drug:  Dapagliflozin-Metformin HCl ER  Take by mouth.        Filed Vitals:   08/23/15 2115 08/24/15 0423  BP: 117/80 148/91  Pulse: 103 94  Temp: 97.8 F (36.6 C) 97.6 F (36.4 C)  Resp: 18 20    Skin clean, dry and intact without evidence of skin break down, no evidence of skin tears noted. IV catheter discontinued intact. Site without signs and symptoms of complications. Dressing and pressure applied. Pt denies pain at this time. No complaints noted.  An After Visit Summary was printed and given to the patient. Patient escorted and D/C home via private auto.  Dola Argyle

## 2015-08-24 NOTE — Anesthesia Postprocedure Evaluation (Signed)
Anesthesia Post Note  Patient: Casey Cole  Procedure(s) Performed: Procedure(s) (LRB): IRRIGATION AND DEBRIDEMENT PERIRECTAL ABSCESS (N/A)  Patient location during evaluation: PACU Anesthesia Type: General Level of consciousness: awake and alert and oriented Pain management: pain level controlled Vital Signs Assessment: post-procedure vital signs reviewed and stable Respiratory status: spontaneous breathing Cardiovascular status: blood pressure returned to baseline Anesthetic complications: no    Last Vitals:  Filed Vitals:   08/23/15 2115 08/24/15 0423  BP: 117/80 148/91  Pulse: 103 94  Temp: 36.6 C 36.4 C  Resp: 18 20    Last Pain:  Filed Vitals:   08/24/15 0423  PainSc: 7                  Ruwayda Curet

## 2015-08-26 LAB — WOUND CULTURE: CULTURE: NORMAL

## 2015-08-27 LAB — ANAEROBIC CULTURE

## 2015-08-31 ENCOUNTER — Ambulatory Visit (INDEPENDENT_AMBULATORY_CARE_PROVIDER_SITE_OTHER): Payer: Commercial Managed Care - PPO | Admitting: *Deleted

## 2015-08-31 DIAGNOSIS — L0231 Cutaneous abscess of buttock: Secondary | ICD-10-CM

## 2015-08-31 NOTE — Progress Notes (Signed)
Patient came in today for a wound check/drain removal.  Drain removed without difficulty. The wound is clean and has some drainage noted. He states the drainage is less than before. Area is still tender. He will continue his twice a day showers to keep area clean. He does have a few antibiotics left to take as well. Aware to call for any complications. Follow up as scheduled.

## 2015-08-31 NOTE — Patient Instructions (Signed)
The patient is aware to call back for any questions or concerns.  

## 2015-09-07 ENCOUNTER — Encounter: Payer: Self-pay | Admitting: General Surgery

## 2015-09-07 ENCOUNTER — Ambulatory Visit (INDEPENDENT_AMBULATORY_CARE_PROVIDER_SITE_OTHER): Payer: Commercial Managed Care - PPO | Admitting: General Surgery

## 2015-09-07 VITALS — BP 130/88 | HR 90 | Resp 16 | Ht 69.0 in | Wt 261.0 lb

## 2015-09-07 DIAGNOSIS — L0231 Cutaneous abscess of buttock: Secondary | ICD-10-CM

## 2015-09-07 NOTE — Patient Instructions (Signed)
The patient is aware to call back for any questions or concerns.  

## 2015-09-07 NOTE — Progress Notes (Signed)
Patient ID: Casey Cole, male   DOB: [birth year], 40 y.o.   MRN: [id number]  Chief Complaint  Patient presents with  . Follow-up    HPI Casey Cole is a 40 y.o. male. Here today for postoperative visit, I/D perirectal abscess. He states he is much better. Minimal to no drainage. Pain-free. No bleeding. The patient tolerated drain removal by the nurse last week well.  I personally reviewed the patient's history.   HPI  Past Medical History  Diagnosis Date  . Sleep apnea   . Diabetes mellitus without complication ([place])   . Hyperlipidemia   . Hypertension     Past Surgical History  Procedure Laterality Date  . Cholecystectomy    . Incise and drain abcess Left 07-12-15    left buttock  . Incision and drainage perirectal abscess N/A 08/23/2015    Procedure: IRRIGATION AND DEBRIDEMENT PERIRECTAL ABSCESS;  Surgeon: Robert Bellow, MD;  Location: [facility] ORS;  Service: General;  Laterality: N/A;  Anal Scope    Family History  Problem Relation Age of Onset  . Stroke Paternal Grandfather   . Heart attack Paternal Grandfather   . Hypertension Father     Social History Social History  Substance Use Topics  . Smoking status: Current Every Day Smoker -- 1.00 packs/day    Types: Cigarettes  . Smokeless tobacco: Never Used  . Alcohol Use: 1.8 oz/week    3 Shots of liquor per week    No Known Allergies  Current Outpatient Prescriptions  Medication Sig Dispense Refill  . benazepril (LOTENSIN) 20 MG tablet Take 20 mg by mouth daily. Reported on 08/23/2015    . metoprolol succinate (TOPROL-XL) 25 MG 24 hr tablet Take 25 mg by mouth daily.    . ONE TOUCH ULTRA TEST test strip USE AS DIRECTED ONCE DAILY 100 each 12  . XIGDUO XR 02-999 MG TB24 TAKE 1 TABLET BY MOUTH DAILY 30 tablet 0   No current facility-administered medications for this visit.    Review of Systems Review of Systems  Constitutional: Negative.   Respiratory: Negative.   Cardiovascular: Negative.     Blood  pressure 130/88, pulse 90, resp. rate 16, height 5\' 9"  (1.753 m), weight 261 lb (118.389 kg).  Physical Exam Physical Exam  Constitutional: He is oriented to person, place, and time. He appears well-developed and well-nourished.  Genitourinary:     Neurological: He is alert and oriented to person, place, and time.  Skin: Skin is warm and dry.  Psychiatric: His behavior is normal.    Data Reviewed 08/23/2015 culture showed normal skin flora. No anaerobes.  Assessment    Study progress status post drainage of perineal abscess.    Plan    Local wound care reviewed. Warm compress applications encouraged.     Follow up in 2 weeks.   PCP:  Casey Cole This information has been scribed by Karie Fetch RN, BSN,BC.    Robert Bellow 09/08/2015, 6:22 AM

## 2015-09-11 ENCOUNTER — Ambulatory Visit: Payer: Commercial Managed Care - PPO | Admitting: General Surgery

## 2015-09-11 NOTE — Discharge Summary (Signed)
Physician Discharge Summary  Patient ID: Casey Cole MRN: [id number] DOB/AGE: Oct 03, 1975 40 y.o.  Admit date: 08/23/2015 Discharge date: 09/11/2015  Admission Diagnoses:Perineal abscess.  Discharge Diagnoses: Same Active Problems:   Abscess of superficial perineal space   Discharged Condition: Improved   Hospital Course: The patient was taken to the operating room the day of admission underwent exam under anesthesia followed by incision and drainage of a perineal abscess. This did not appear to contain a perirectal component. He was observed overnight for management of pain. \  Consults: None  Significant Diagnostic Studies: Culture showed skin flora  Treatments: Local wound care  Discharge Exam: Blood pressure 148/91, pulse 94, temperature 97.6 F (36.4 C), temperature source Oral, resp. rate 20, height 5\' 9"  (1.753 m), weight 258 lb 12.8 oz (117.391 kg), SpO2 93 %. Induration and fluctuance markedly improved postoperatively at surgical site.  Disposition: 01-Home or Self Care  Discharge Instructions    Diet Carb Modified    Complete by:  As directed      Discharge instructions    Complete by:  As directed   Change pad as needed.  OK to shower, bathe.  No swimming pools/ ocean water with drain in place.   Tylenol/ Advil/ Aleve: if needed for soreness.  Norco (hydrocodone): If needed for pain.  This medication may constipate.  Laxative of choice if needed.     Increase activity slowly    Complete by:  As directed             Medication List    STOP taking these medications        XIGDUO XR 02-999 MG Tb24  Generic drug:  Dapagliflozin-Metformin HCl ER      TAKE these medications        benazepril 20 MG tablet  Commonly known as:  LOTENSIN  Take 20 mg by mouth daily. Reported on 08/23/2015     metoprolol succinate 25 MG 24 hr tablet  Commonly known as:  TOPROL-XL  Take 25 mg by mouth daily.     ONE TOUCH ULTRA TEST test strip  Generic drug:  glucose  blood  USE AS DIRECTED ONCE DAILY           Follow-up Information    Follow up with Robert Bellow, MD. Go on 08/31/2015.   Specialties:  General Surgery, Radiology   Why:  Thursday at 10:00am  for drain removal.   Contact information:   [place] [address] [phone]       Follow up with Robert Bellow, MD. Go on 09/07/2015.   Specialties:  General Surgery, Radiology   Why:  Thursday at 3:15pm for hospital follow-up   Contact information:   [address] [place] [address] [phone]       Signed: Robert Bellow 09/11/2015, 8:31 AM

## 2015-09-27 ENCOUNTER — Encounter: Payer: Self-pay | Admitting: General Surgery

## 2015-09-27 ENCOUNTER — Ambulatory Visit (INDEPENDENT_AMBULATORY_CARE_PROVIDER_SITE_OTHER): Payer: Commercial Managed Care - PPO | Admitting: General Surgery

## 2015-09-27 VITALS — BP 128/76 | HR 76 | Resp 16 | Ht 69.0 in | Wt 261.0 lb

## 2015-09-27 DIAGNOSIS — L0231 Cutaneous abscess of buttock: Secondary | ICD-10-CM

## 2015-09-27 NOTE — Progress Notes (Signed)
Patient ID: Casey Cole, male   DOB: October 28, 1975, 40 y.o.   MRN: [id number]  Chief Complaint  Patient presents with  . Follow-up    peri anal abscess    HPI Casey Cole is a 40 y.o. male for postoperative visit, I/D perirectal abscess. He states he is much better. Minimal to no drainage. Pain-free. No bleeding. HPI  Past Medical History  Diagnosis Date  . Sleep apnea   . Diabetes mellitus without complication ([place])   . Hyperlipidemia   . Hypertension     Past Surgical History  Procedure Laterality Date  . Cholecystectomy    . Incise and drain abcess Left 07-12-15    left buttock  . Incision and drainage perirectal abscess N/A 08/23/2015    Procedure: IRRIGATION AND DEBRIDEMENT PERIRECTAL ABSCESS;  Surgeon: Robert Bellow, MD;  Location: [facility] ORS;  Service: General;  Laterality: N/A;  Anal Scope    Family History  Problem Relation Age of Onset  . Stroke Paternal Grandfather   . Heart attack Paternal Grandfather   . Hypertension Father     Social History Social History  Substance Use Topics  . Smoking status: Current Every Day Smoker -- 1.00 packs/day    Types: Cigarettes  . Smokeless tobacco: Never Used  . Alcohol Use: 1.8 oz/week    3 Shots of liquor per week    No Known Allergies  Current Outpatient Prescriptions  Medication Sig Dispense Refill  . benazepril (LOTENSIN) 20 MG tablet Take 20 mg by mouth daily. Reported on 08/23/2015    . metoprolol succinate (TOPROL-XL) 25 MG 24 hr tablet Take 25 mg by mouth daily.    . ONE TOUCH ULTRA TEST test strip USE AS DIRECTED ONCE DAILY 100 each 12  . XIGDUO XR 02-999 MG TB24 TAKE 1 TABLET BY MOUTH DAILY 30 tablet 0   No current facility-administered medications for this visit.    Review of Systems Review of Systems  Respiratory: Negative.   Cardiovascular: Negative.     Blood pressure 128/76, pulse 76, resp. rate 16, height 5\' 9"  (1.753 m), weight 261 lb (118.389 kg).  Physical Exam Physical Exam   Constitutional: He is oriented to person, place, and time. He appears well-developed and well-nourished.  Genitourinary:     Neurological: He is alert and oriented to person, place, and time.  Skin: Skin is warm and dry.       Assessment    Doing well status post incision and drainage of perineal abscess.    Plan       Patient to return as needed. The patient was encouraged to call if he develops any new symptoms in the area.  PCP:  [place] This information has been scribed by Gaspar Cola CMA.   Robert Bellow 09/28/2015, 11:25 AM

## 2015-09-27 NOTE — Patient Instructions (Signed)
Patient to return as needed. 

## 2015-10-17 ENCOUNTER — Telehealth: Payer: Self-pay | Admitting: Family Medicine

## 2015-10-17 NOTE — Telephone Encounter (Signed)
Pt's wife called stated husband needs CPE before the end of June so his insurance does not go up, wants to switch to Dr. Wynetta Emery so he can have this done. Explained to pt that he will no longer be a Dr. Jeananne Rama patient. Is this ok with Dr. Wynetta Emery. Please advise so I can contact pt to inform. Otherwise, pt is going to change practices. Thanks.

## 2015-10-17 NOTE — Telephone Encounter (Signed)
Pt's chart has been updated to reflect change in PCP. Thanks.

## 2015-10-17 NOTE — Telephone Encounter (Signed)
That's fine. I'll be more than willing to see him.

## 2015-10-27 ENCOUNTER — Encounter: Payer: Self-pay | Admitting: Family Medicine

## 2015-10-27 ENCOUNTER — Ambulatory Visit (INDEPENDENT_AMBULATORY_CARE_PROVIDER_SITE_OTHER): Payer: Commercial Managed Care - PPO | Admitting: Family Medicine

## 2015-10-27 VITALS — BP 128/87 | HR 110 | Temp 98.0°F | Ht 70.0 in | Wt 256.0 lb

## 2015-10-27 DIAGNOSIS — Z0000 Encounter for general adult medical examination without abnormal findings: Secondary | ICD-10-CM

## 2015-10-27 DIAGNOSIS — I129 Hypertensive chronic kidney disease with stage 1 through stage 4 chronic kidney disease, or unspecified chronic kidney disease: Secondary | ICD-10-CM | POA: Diagnosis not present

## 2015-10-27 DIAGNOSIS — R81 Glycosuria: Secondary | ICD-10-CM | POA: Diagnosis not present

## 2015-10-27 DIAGNOSIS — E1129 Type 2 diabetes mellitus with other diabetic kidney complication: Secondary | ICD-10-CM | POA: Insufficient documentation

## 2015-10-27 DIAGNOSIS — G4733 Obstructive sleep apnea (adult) (pediatric): Secondary | ICD-10-CM | POA: Insufficient documentation

## 2015-10-27 DIAGNOSIS — E785 Hyperlipidemia, unspecified: Secondary | ICD-10-CM | POA: Insufficient documentation

## 2015-10-27 DIAGNOSIS — I10 Essential (primary) hypertension: Secondary | ICD-10-CM | POA: Diagnosis not present

## 2015-10-27 DIAGNOSIS — E119 Type 2 diabetes mellitus without complications: Secondary | ICD-10-CM

## 2015-10-27 DIAGNOSIS — F32A Depression, unspecified: Secondary | ICD-10-CM | POA: Insufficient documentation

## 2015-10-27 DIAGNOSIS — F329 Major depressive disorder, single episode, unspecified: Secondary | ICD-10-CM | POA: Diagnosis not present

## 2015-10-27 HISTORY — DX: Depression, unspecified: F32.A

## 2015-10-27 LAB — MICROSCOPIC EXAMINATION
Bacteria, UA: NONE SEEN
EPITHELIAL CELLS (NON RENAL): NONE SEEN /HPF (ref 0–10)
RBC MICROSCOPIC, UA: NONE SEEN /HPF (ref 0–?)
WBC UA: NONE SEEN /HPF (ref 0–?)

## 2015-10-27 LAB — UA/M W/RFLX CULTURE, ROUTINE
Bilirubin, UA: NEGATIVE
Ketones, UA: NEGATIVE
Leukocytes, UA: NEGATIVE
NITRITE UA: NEGATIVE
PH UA: 6.5 (ref 5.0–7.5)
Specific Gravity, UA: 1.02 (ref 1.005–1.030)
UUROB: 1 mg/dL (ref 0.2–1.0)

## 2015-10-27 LAB — BAYER DCA HB A1C WAIVED: HB A1C (BAYER DCA - WAIVED): 8.7 % — ABNORMAL HIGH (ref <7.0)

## 2015-10-27 LAB — MICROALBUMIN, URINE WAIVED
CREATININE, URINE WAIVED: 300 mg/dL (ref 10–300)
Microalb, Ur Waived: 150 mg/L — ABNORMAL HIGH (ref 0–19)

## 2015-10-27 MED ORDER — DAPAGLIFLOZIN PRO-METFORMIN ER 5-1000 MG PO TB24: 1.0000 | ORAL_TABLET | Freq: Two times a day (BID) | ORAL | Status: DC

## 2015-10-27 MED ORDER — LISINOPRIL 5 MG PO TABS: 5.0000 mg | ORAL_TABLET | Freq: Every day | ORAL | Status: DC

## 2015-10-27 NOTE — Progress Notes (Signed)
BP 128/87 mmHg  Pulse 110  Temp(Src) 98 F (36.7 C)  Ht 5\' 10"  (1.778 m)  Wt 256 lb (116.121 kg)  BMI 36.73 kg/m2  SpO2 96%   Subjective:    Patient ID: Casey Cole, male    DOB: November 14, 1975, 40 y.o.   MRN: [id number]  HPI: Casey Cole is a 40 y.o. male presenting on 10/27/2015 for comprehensive medical examination. Current medical complaints include:  HYPERTENSION / HYPERLIPIDEMIA- has not been taking his blood pressure medicine. Doesn't remember the last time he took it, over a month ago, made him dizzy when he took it Satisfied with current treatment? no Duration of hypertension: chronic BP monitoring frequency: a few times a week BP range: 120s/80s BP medication side effects: yes- dizzy Past BP meds: benazapril, metoprolol Duration of hyperlipidemia: chronic Cholesterol medication side effects: never took the medicine Cholesterol supplements: none Past cholesterol medications: none Medication compliance: poor compliance Aspirin: no Recent stressors: yes Recurrent headaches: no Visual changes: no Palpitations: no Dyspnea: no Chest pain: no Lower extremity edema: no Dizzy/lightheaded: no  DIABETES Hypoglycemic episodes:no Polydipsia/polyuria: no Visual disturbance: no Chest pain: no Paresthesias: no Glucose Monitoring: yes  Accucheck frequency: rarely  Fasting glucose: 150-160s Taking Insulin?: no Blood Pressure Monitoring: a few times a week Retinal Examination: Not up to Date Foot Exam: Up to Date Diabetic Education: Completed Pneumovax: Up to Date Influenza: Postponed to flu season Aspirin: no  He currently lives with: wife  Interim Problems from his last visit: no  Depression Screen done today and results listed below:  Depression screen [facility] 2/9 10/27/2015  Decreased Interest 2  Down, Depressed, Hopeless 2  PHQ - 2 Score 4  Altered sleeping 2  Tired, decreased energy 2  Change in appetite 2  Feeling bad or failure about yourself  1  Trouble  concentrating 1  Moving slowly or fidgety/restless 1  Suicidal thoughts 0  PHQ-9 Score 13  Difficult doing work/chores Very difficult    GAD 7 : Generalized Anxiety Score 10/27/2015  Nervous, Anxious, on Edge 2  Control/stop worrying 2  Worry too much - different things 2  Trouble relaxing 2  Restless 1  Easily annoyed or irritable 3  Afraid - awful might happen 1  Total GAD 7 Score 13  Anxiety Difficulty Somewhat difficult    Past Medical History:  Past Medical History  Diagnosis Date  . Sleep apnea   . Hyperlipidemia   . Hypertension   . Diabetes mellitus without complication [facility])     Surgical History:  Past Surgical History  Procedure Laterality Date  . Cholecystectomy    . Incise and drain abcess Left 07-12-15    left buttock  . Incision and drainage perirectal abscess N/A 08/23/2015    Procedure: IRRIGATION AND DEBRIDEMENT PERIRECTAL ABSCESS;  Surgeon: Robert Bellow, MD;  Location: [facility] ORS;  Service: General;  Laterality: N/A;  Anal Scope    Medications:  Current Outpatient Prescriptions on File Prior to Visit  Medication Sig  . ONE TOUCH ULTRA TEST test strip USE AS DIRECTED ONCE DAILY   No current facility-administered medications on file prior to visit.   Allergies:  No Known Allergies  Social History:  Social History   Social History  . Marital Status: Married    Spouse Name: N/A  . Number of Children: N/A  . Years of Education: N/A   Occupational History  . Not on file.   Social History Main Topics  . Smoking status: Current Every Day Smoker --  1.00 packs/day    Types: Cigarettes  . Smokeless tobacco: Never Used  . Alcohol Use: 1.8 oz/week    3 Shots of liquor per week  . Drug Use: No  . Sexual Activity: Not on file   Other Topics Concern  . Not on file   Social History Narrative   History  Smoking status  . Current Every Day Smoker -- 1.00 packs/day  . Types: Cigarettes  Smokeless tobacco  . Never Used   History  Alcohol  Use  . 1.8 oz/week  . 3 Shots of liquor per week   Family History:  Family History  Problem Relation Age of Onset  . Stroke Paternal Grandfather   . Heart attack Paternal Grandfather   . Hypertension Father    Past medical history, surgical history, medications, allergies, family history and social history reviewed with patient today and changes made to appropriate areas of the chart.   Review of Systems  Constitutional: Negative.   HENT: Negative.   Eyes: Negative.   Respiratory: Negative.   Cardiovascular: Negative.   Gastrointestinal: Negative.   Genitourinary: Negative.   Musculoskeletal: Negative.   Skin: Negative.   Neurological: Negative.   Endo/Heme/Allergies: Positive for polydipsia. Negative for environmental allergies. Does not bruise/bleed easily.  Psychiatric/Behavioral: Positive for depression. Negative for suicidal ideas, hallucinations, memory loss and substance abuse. The patient is nervous/anxious. The patient does not have insomnia.     All other ROS negative except what is listed above and in the HPI.      Objective:    BP 128/87 mmHg  Pulse 110  Temp(Src) 98 F (36.7 C)  Ht 5\' 10"  (1.778 m)  Wt 256 lb (116.121 kg)  BMI 36.73 kg/m2  SpO2 96%  Wt Readings from Last 3 Encounters:  10/27/15 256 lb (116.121 kg)  09/27/15 261 lb (118.389 kg)  09/07/15 261 lb (118.389 kg)    Physical Exam  Constitutional: He is oriented to person, place, and time. He appears well-developed and well-nourished. No distress.  HENT:  Head: Normocephalic and atraumatic.  Right Ear: Hearing and external ear normal.  Left Ear: Hearing and external ear normal.  Nose: Nose normal.  Mouth/Throat: Oropharynx is clear and moist. No oropharyngeal exudate.  Eyes: Conjunctivae, EOM and lids are normal. Pupils are equal, round, and reactive to light. Right eye exhibits no discharge. Left eye exhibits no discharge. No scleral icterus.  Neck: Normal range of motion. Neck supple. No  JVD present. No tracheal deviation present. No thyromegaly present.  Cardiovascular: Normal rate, regular rhythm, normal heart sounds and intact distal pulses.  Exam reveals no gallop and no friction rub.   No murmur heard. Pulmonary/Chest: Effort normal and breath sounds normal. No stridor. No respiratory distress. He has no wheezes. He has no rales. He exhibits no tenderness.  Abdominal: Soft. Bowel sounds are normal. He exhibits no distension and no mass. There is no tenderness. There is no rebound and no guarding. Hernia confirmed negative in the right inguinal area and confirmed negative in the left inguinal area.  Genitourinary: Testes normal and penis normal. Right testis shows no mass, no swelling and no tenderness. Right testis is descended. Cremasteric reflex is not absent on the right side. Left testis shows no mass, no swelling and no tenderness. Left testis is descended. Cremasteric reflex is not absent on the left side. Circumcised. No penile tenderness.  Musculoskeletal: Normal range of motion. He exhibits no edema or tenderness.  Lymphadenopathy:    He has no cervical  adenopathy.       Right: No inguinal adenopathy present.       Left: No inguinal adenopathy present.  Neurological: He is alert and oriented to person, place, and time. He has normal reflexes. He displays normal reflexes. No cranial nerve deficit. He exhibits normal muscle tone. Coordination normal.  Skin: Skin is warm, dry and intact. No rash noted. He is not diaphoretic. No erythema. No pallor.  Psychiatric: He has a normal mood and affect. His speech is normal and behavior is normal. Judgment and thought content normal. Cognition and memory are normal.  Nursing note and vitals reviewed.   Results for orders placed or performed in visit on 10/27/15  Microscopic Examination  Result Value Ref Range   WBC, UA None seen 0 -  5 /hpf   RBC, UA None seen 0 -  2 /hpf   Epithelial Cells (non renal) None seen 0 - 10 /hpf    Crystals Present (A) N/A   Crystal Type Amorphous Sediment N/A   Bacteria, UA None seen None seen/Few  Bayer DCA Hb A1c Waived  Result Value Ref Range   Bayer DCA Hb A1c Waived 8.7 (H) <7.0 %  Microalbumin, Urine Waived  Result Value Ref Range   Microalb, Ur Waived 150 (H) 0 - 19 mg/L   Creatinine, Urine Waived 300 10 - 300 mg/dL   Microalb/Creat Ratio 30-300 (H) <30 mg/g  UA/M w/rflx Culture, Routine  Result Value Ref Range   Specific Gravity, UA 1.020 1.005 - 1.030   pH, UA 6.5 5.0 - 7.5   Color, UA Yellow Yellow   Appearance Ur Clear Clear   Leukocytes, UA Negative Negative   Protein, UA 1+ (A) Negative/Trace   Glucose, UA 2+ (A) Negative   Ketones, UA Negative Negative   RBC, UA Trace (A) Negative   Bilirubin, UA Negative Negative   Urobilinogen, Ur 1.0 0.2 - 1.0 mg/dL   Nitrite, UA Negative Negative   Microscopic Examination See below:       Assessment & Plan:   Problem List Items Addressed This Visit      Respiratory   OSA (obstructive sleep apnea)    Not on CPAP. Had sleep study over a year ago. Will get him back into sleep medicine to get on CPAP. Referral generated today.        Endocrine   Diabetes mellitus without complication ([facility])    123456 up to 8.7 today. Will increase his xigduo to 10/2000mg  daily and recheck in 3 months. Follow up on tolerance in 1 month.       Relevant Medications   lisinopril (PRINIVIL,ZESTRIL) 5 MG tablet   Dapagliflozin-Metformin HCl ER (XIGDUO XR) 09-998 MG TB24   Other Relevant Orders   Bayer DCA Hb A1c Waived (Completed)   Comprehensive metabolic panel   Microalbumin, Urine Waived (Completed)   UA/M w/rflx Culture, Routine (Completed)     Genitourinary   Benign hypertensive renal disease    Under good control, but needs ACE inhibitor. Will start low dose lisinopril. Cut in 1/2 if getting dizzy and call. Will recheck 1 month.         Other   Hyperlipidemia    Rechecking levels today. Will treat as needed. Await  results.       Relevant Medications   lisinopril (PRINIVIL,ZESTRIL) 5 MG tablet   Other Relevant Orders   Comprehensive metabolic panel   Lipid Panel w/o Chol/HDL Ratio   Depression    States that this is  due to work issues. Will get his OSA treated and will see how he does. Recheck in 1-2 months.        Other Visit Diagnoses    Routine general medical examination at a health care facility    -  Primary    Up to date on vaccines. Screening labs checked today. Work on diet and exercise. Call with any concerns.     Relevant Orders    CBC with Differential/Platelet    Bayer DCA Hb A1c Waived (Completed)    Comprehensive metabolic panel    Microalbumin, Urine Waived (Completed)    TSH    UA/M w/rflx Culture, Routine (Completed)    Healthcare maintenance        Labs drawn today. Await results.     Relevant Orders    HIV antibody      LABORATORY TESTING:  Health maintenance labs ordered today as discussed above.   IMMUNIZATIONS:   - Tdap: Tetanus vaccination status reviewed: last tetanus booster within 10 years. - Influenza: Up to date  PATIENT COUNSELING:    Sexuality: Discussed sexually transmitted diseases, partner selection, use of condoms, avoidance of unintended pregnancy  and contraceptive alternatives.   Advised to avoid cigarette smoking.  I discussed with the patient that most people either abstain from alcohol or drink within safe limits (<=14/week and <=4 drinks/occasion for males, <=7/weeks and <= 3 drinks/occasion for females) and that the risk for alcohol disorders and other health effects rises proportionally with the number of drinks per week and how often a drinker exceeds daily limits.  Discussed cessation/primary prevention of drug use and availability of treatment for abuse.   Diet: Encouraged to adjust caloric intake to maintain  or achieve ideal body weight, to reduce intake of dietary saturated fat and total fat, to limit sodium intake by avoiding high  sodium foods and not adding table salt, and to maintain adequate dietary potassium and calcium preferably from fresh fruits, vegetables, and low-fat dairy products.    stressed the importance of regular exercise  Injury prevention: Discussed safety belts, safety helmets, smoke detector, smoking near bedding or upholstery.   Dental health: Discussed importance of regular tooth brushing, flossing, and dental visits.   Follow up plan: NEXT PREVENTATIVE PHYSICAL DUE IN 1 YEAR. Return in about 6 weeks (around 12/08/2015) for Follow up DM and BP.

## 2015-10-27 NOTE — Assessment & Plan Note (Signed)
Under good control, but needs ACE inhibitor. Will start low dose lisinopril. Cut in 1/2 if getting dizzy and call. Will recheck 1 month.

## 2015-10-27 NOTE — Assessment & Plan Note (Signed)
Rechecking levels today. Will treat as needed. Await results.

## 2015-10-27 NOTE — Assessment & Plan Note (Signed)
States that this is due to work issues. Will get his OSA treated and will see how he does. Recheck in 1-2 months.

## 2015-10-27 NOTE — Assessment & Plan Note (Signed)
A1c up to 8.7 today. Will increase his xigduo to 10/2000mg  daily and recheck in 3 months. Follow up on tolerance in 1 month.

## 2015-10-27 NOTE — Assessment & Plan Note (Signed)
Not on CPAP. Had sleep study over a year ago. Will get him back into sleep medicine to get on CPAP. Referral generated today.

## 2015-10-27 NOTE — Patient Instructions (Signed)

## 2015-10-28 LAB — COMPREHENSIVE METABOLIC PANEL
A/G RATIO: 1.7 (ref 1.2–2.2)
ALBUMIN: 4.5 g/dL (ref 3.5–5.5)
ALK PHOS: 95 IU/L (ref 39–117)
ALT: 29 IU/L (ref 0–44)
AST: 20 IU/L (ref 0–40)
BILIRUBIN TOTAL: 1 mg/dL (ref 0.0–1.2)
BUN / CREAT RATIO: 11 (ref 9–20)
BUN: 11 mg/dL (ref 6–20)
CHLORIDE: 99 mmol/L (ref 96–106)
CO2: 24 mmol/L (ref 18–29)
Calcium: 9.3 mg/dL (ref 8.7–10.2)
Creatinine, Ser: 0.96 mg/dL (ref 0.76–1.27)
GFR calc non Af Amer: 99 mL/min/{1.73_m2} (ref >59)
GFR, EST AFRICAN AMERICAN: 115 mL/min/{1.73_m2} (ref >59)
GLOBULIN, TOTAL: 2.7 g/dL (ref 1.5–4.5)
Glucose: 195 mg/dL — ABNORMAL HIGH (ref 65–99)
POTASSIUM: 4.6 mmol/L (ref 3.5–5.2)
SODIUM: 140 mmol/L (ref 134–144)
TOTAL PROTEIN: 7.2 g/dL (ref 6.0–8.5)

## 2015-10-28 LAB — HIV ANTIBODY (ROUTINE TESTING W REFLEX): HIV SCREEN 4TH GENERATION: NONREACTIVE

## 2015-10-28 LAB — CBC WITH DIFFERENTIAL/PLATELET
BASOS: 0 %
Basophils Absolute: 0 10*3/uL (ref 0.0–0.2)
EOS (ABSOLUTE): 0.1 10*3/uL (ref 0.0–0.4)
Eos: 1 %
HEMOGLOBIN: 17.8 g/dL — AB (ref 12.6–17.7)
Hematocrit: 50.8 % (ref 37.5–51.0)
IMMATURE GRANS (ABS): 0 10*3/uL (ref 0.0–0.1)
Immature Granulocytes: 0 %
LYMPHS ABS: 2.4 10*3/uL (ref 0.7–3.1)
LYMPHS: 26 %
MCH: 31.7 pg (ref 26.6–33.0)
MCHC: 35 g/dL (ref 31.5–35.7)
MCV: 90 fL (ref 79–97)
MONOCYTES: 5 %
Monocytes Absolute: 0.4 10*3/uL (ref 0.1–0.9)
NEUTROS ABS: 6.5 10*3/uL (ref 1.4–7.0)
Neutrophils: 68 %
Platelets: 160 10*3/uL (ref 150–379)
RBC: 5.62 x10E6/uL (ref 4.14–5.80)
RDW: 13.9 % (ref 12.3–15.4)
WBC: 9.5 10*3/uL (ref 3.4–10.8)

## 2015-10-28 LAB — TSH: TSH: 0.548 u[IU]/mL (ref 0.450–4.500)

## 2015-12-08 ENCOUNTER — Encounter: Payer: Self-pay | Admitting: Family Medicine

## 2015-12-08 ENCOUNTER — Ambulatory Visit (INDEPENDENT_AMBULATORY_CARE_PROVIDER_SITE_OTHER): Payer: Commercial Managed Care - PPO | Admitting: Family Medicine

## 2015-12-08 VITALS — BP 111/79 | HR 107 | Temp 98.7°F | Wt 255.0 lb

## 2015-12-08 DIAGNOSIS — I129 Hypertensive chronic kidney disease with stage 1 through stage 4 chronic kidney disease, or unspecified chronic kidney disease: Secondary | ICD-10-CM

## 2015-12-08 DIAGNOSIS — G4733 Obstructive sleep apnea (adult) (pediatric): Secondary | ICD-10-CM | POA: Diagnosis not present

## 2015-12-08 DIAGNOSIS — F329 Major depressive disorder, single episode, unspecified: Secondary | ICD-10-CM

## 2015-12-08 DIAGNOSIS — E119 Type 2 diabetes mellitus without complications: Secondary | ICD-10-CM

## 2015-12-08 DIAGNOSIS — F32A Depression, unspecified: Secondary | ICD-10-CM

## 2015-12-08 NOTE — Assessment & Plan Note (Signed)
Tolerating the higher dose of the xigduo Due for A1c in 6 weeks. Call with any concerns.

## 2015-12-08 NOTE — Assessment & Plan Note (Signed)
Stable. Will let us know if he wants to start meds. Looking for a new job. Continue to monitor.

## 2015-12-08 NOTE — Assessment & Plan Note (Signed)
Getting his CPAP. No concerns. Continue to monitor.

## 2015-12-08 NOTE — Assessment & Plan Note (Signed)
Under good control and tolerating the lisinopril. Rechecking BMP today. Continue to monitor.

## 2015-12-08 NOTE — Progress Notes (Signed)
BP 111/79   Pulse (!) 107   Temp 98.7 F (37.1 C)   Wt 255 lb (115.7 kg)   SpO2 97%   BMI 36.59 kg/m    Subjective:    Patient ID: Casey Cole, male    DOB: June 10, 1975, 40 y.o.   MRN: [id number]  HPI: Casey Cole is a 40 y.o. male  Chief Complaint  Patient presents with  . Sleep Apnea  . Depression  . Hypertension  . Diabetes   DEPRESSION Mood status: stable Satisfied with current treatment?: no Symptom severity: moderate  Duration of current treatment : months Psychotherapy/counseling: no  Depressed mood: yes Anxious mood: yes Anhedonia: no Significant weight loss or gain: no Insomnia: no  Fatigue: yes Feelings of worthlessness or guilt: no Impaired concentration/indecisiveness: no Suicidal ideations: no Hopelessness: no Crying spells: no Depression screen [facility] 2/9 12/08/2015 10/27/2015  Decreased Interest 2 2  Down, Depressed, Hopeless 2 2  PHQ - 2 Score 4 4  Altered sleeping 0 2  Tired, decreased energy 2 2  Change in appetite 2 2  Feeling bad or failure about yourself  1 1  Trouble concentrating 2 1  Moving slowly or fidgety/restless 1 1  Suicidal thoughts 0 0  PHQ-9 Score 12 13  Difficult doing work/chores Somewhat difficult Very difficult   SLEEP APNEA- went and got his sleep study in July. Going to buy his own machine.  Sleep apnea status: stable Duration: chronic Satisfied with current treatment?:  yes CPAP use:  yes Sleep quality with CPAP use: excellent Treament compliance:excellent compliance Wakes feeling refreshed:  yes Daytime hypersomnolence:  no Fatigue:  yes Insomnia:  no Good sleep hygiene:  no Difficulty falling asleep:  no Difficulty staying asleep:  no Snoring bothers bed partner:  no Observed apnea by bed partner: no Obesity:  yes Hypertension: yes  Pulmonary hypertension:  no Coronary artery disease:  no  HYPERTENSION- has been under good control, lisinopril added last visit for renal protection.  Hypertension status:  controlled  Satisfied with current treatment? yes Duration of hypertension: chronic BP monitoring frequency:  not checking BP medication side effects:  no Medication compliance: excellent compliance Aspirin: no Recurrent headaches: no Visual changes: no Palpitations: no Dyspnea: no Chest pain: no Lower extremity edema: no Dizzy/lightheaded: no  Xigduo increased last visit- He notes that he is tolerating it well  Relevant past medical, surgical, family and social history reviewed and updated as indicated. Interim medical history since our last visit reviewed. Allergies and medications reviewed and updated.  Review of Systems  Constitutional: Negative.   Respiratory: Negative.   Cardiovascular: Negative.   Psychiatric/Behavioral: Negative.     Per HPI unless specifically indicated above     Objective:    BP 111/79   Pulse (!) 107   Temp 98.7 F (37.1 C)   Wt 255 lb (115.7 kg)   SpO2 97%   BMI 36.59 kg/m   Wt Readings from Last 3 Encounters:  12/08/15 255 lb (115.7 kg)  10/27/15 256 lb (116.1 kg)  09/27/15 261 lb (118.4 kg)    Orthostatic VS for the past 24 hrs:  BP- Lying Pulse- Lying BP- Sitting Pulse- Sitting BP- Standing at 0 minutes Pulse- Standing at 0 minutes  12/08/15 1606 127/81 103 115/79 108 118/81 111      Physical Exam  Constitutional: He is oriented to person, place, and time. He appears well-developed and well-nourished. No distress.  HENT:  Head: Normocephalic and atraumatic.  Right Ear: Hearing normal.  Left Ear: Hearing normal.  Nose: Nose normal.  Eyes: Conjunctivae and lids are normal. Right eye exhibits no discharge. Left eye exhibits no discharge. No scleral icterus.  Cardiovascular: Normal rate, regular rhythm, normal heart sounds and intact distal pulses.  Exam reveals no gallop and no friction rub.   No murmur heard. Pulmonary/Chest: Effort normal and breath sounds normal. No respiratory distress. He has no wheezes. He has no rales.  He exhibits no tenderness.  Musculoskeletal: Normal range of motion.  Neurological: He is alert and oriented to person, place, and time.  Skin: Skin is warm, dry and intact. No rash noted. No erythema. No pallor.  Psychiatric: He has a normal mood and affect. His speech is normal and behavior is normal. Judgment and thought content normal. Cognition and memory are normal.  Nursing note and vitals reviewed.  Diabetic Foot Exam - Simple   Simple Foot Form Diabetic Foot exam was performed with the following findings:  Yes 12/08/2015  4:13 PM  Visual Inspection No deformities, no ulcerations, no other skin breakdown bilaterally:  Yes Sensation Testing Intact to touch and monofilament testing bilaterally:  Yes Pulse Check Posterior Tibialis and Dorsalis pulse intact bilaterally:  Yes Comments      Results for orders placed or performed in visit on 10/27/15  Microscopic Examination  Result Value Ref Range   WBC, UA None seen 0 - 5 /hpf   RBC, UA None seen 0 - 2 /hpf   Epithelial Cells (non renal) None seen 0 - 10 /hpf   Crystals Present (A) N/A   Crystal Type Amorphous Sediment N/A   Bacteria, UA None seen None seen/Few  HIV antibody  Result Value Ref Range   HIV Screen 4th Generation wRfx Non Reactive Non Reactive  CBC with Differential/Platelet  Result Value Ref Range   WBC 9.5 3.4 - 10.8 x10E3/uL   RBC 5.62 4.14 - 5.80 x10E6/uL   Hemoglobin 17.8 (H) 12.6 - 17.7 g/dL   Hematocrit 50.8 37.5 - 51.0 %   MCV 90 79 - 97 fL   MCH 31.7 26.6 - 33.0 pg   MCHC 35.0 31.5 - 35.7 g/dL   RDW 13.9 12.3 - 15.4 %   Platelets 160 150 - 379 x10E3/uL   Neutrophils 68 %   Lymphs 26 %   Monocytes 5 %   Eos 1 %   Basos 0 %   Neutrophils Absolute 6.5 1.4 - 7.0 x10E3/uL   Lymphocytes Absolute 2.4 0.7 - 3.1 x10E3/uL   Monocytes Absolute 0.4 0.1 - 0.9 x10E3/uL   EOS (ABSOLUTE) 0.1 0.0 - 0.4 x10E3/uL   Basophils Absolute 0.0 0.0 - 0.2 x10E3/uL   Immature Granulocytes 0 %   Immature Grans (Abs)  0.0 0.0 - 0.1 x10E3/uL  Bayer DCA Hb A1c Waived  Result Value Ref Range   Bayer DCA Hb A1c Waived 8.7 (H) <7.0 %  Comprehensive metabolic panel  Result Value Ref Range   Glucose 195 (H) 65 - 99 mg/dL   BUN 11 6 - 20 mg/dL   Creatinine, Ser 0.96 0.76 - 1.27 mg/dL   GFR calc non Af Amer 99 >59 mL/min/1.73   GFR calc Af Amer 115 >59 mL/min/1.73   BUN/Creatinine Ratio 11 9 - 20   Sodium 140 134 - 144 mmol/L   Potassium 4.6 3.5 - 5.2 mmol/L   Chloride 99 96 - 106 mmol/L   CO2 24 18 - 29 mmol/L   Calcium 9.3 8.7 - 10.2 mg/dL   Total Protein 7.2  6.0 - 8.5 g/dL   Albumin 4.5 3.5 - 5.5 g/dL   Globulin, Total 2.7 1.5 - 4.5 g/dL   Albumin/Globulin Ratio 1.7 1.2 - 2.2   Bilirubin Total 1.0 0.0 - 1.2 mg/dL   Alkaline Phosphatase 95 39 - 117 IU/L   AST 20 0 - 40 IU/L   ALT 29 0 - 44 IU/L  Microalbumin, Urine Waived  Result Value Ref Range   Microalb, Ur Waived 150 (H) 0 - 19 mg/L   Creatinine, Urine Waived 300 10 - 300 mg/dL   Microalb/Creat Ratio 30-300 (H) <30 mg/g  TSH  Result Value Ref Range   TSH 0.548 0.450 - 4.500 uIU/mL  UA/M w/rflx Culture, Routine  Result Value Ref Range   Specific Gravity, UA 1.020 1.005 - 1.030   pH, UA 6.5 5.0 - 7.5   Color, UA Yellow Yellow   Appearance Ur Clear Clear   Leukocytes, UA Negative Negative   Protein, UA 1+ (A) Negative/Trace   Glucose, UA 2+ (A) Negative   Ketones, UA Negative Negative   RBC, UA Trace (A) Negative   Bilirubin, UA Negative Negative   Urobilinogen, Ur 1.0 0.2 - 1.0 mg/dL   Nitrite, UA Negative Negative   Microscopic Examination See below:       Assessment & Plan:   Problem List Items Addressed This Visit      Respiratory   OSA (obstructive sleep apnea) - Primary    Getting his CPAP. No concerns. Continue to monitor.         Endocrine   Diabetes mellitus without complication ([place])    Tolerating the higher dose of the xigduo Due for A1c in 6 weeks. Call with any concerns.      Relevant Orders   Basic  metabolic panel     Genitourinary   Benign hypertensive renal disease    Under good control and tolerating the lisinopril. Rechecking BMP today. Continue to monitor.       Relevant Orders   Basic metabolic panel     Other   Depression    Stable. Will let us know if he wants to start meds. Looking for a new job. Continue to monitor.        Other Visit Diagnoses   None.      Follow up plan: Return 6 weeks, for DM visit.

## 2015-12-09 LAB — BASIC METABOLIC PANEL
BUN/Creatinine Ratio: 14 (ref 9–20)
BUN: 13 mg/dL (ref 6–20)
CO2: 23 mmol/L (ref 18–29)
Calcium: 9.7 mg/dL (ref 8.7–10.2)
Chloride: 97 mmol/L (ref 96–106)
Creatinine, Ser: 0.9 mg/dL (ref 0.76–1.27)
GFR, EST AFRICAN AMERICAN: 124 mL/min/{1.73_m2} (ref >59)
GFR, EST NON AFRICAN AMERICAN: 107 mL/min/{1.73_m2} (ref >59)
Glucose: 125 mg/dL — ABNORMAL HIGH (ref 65–99)
POTASSIUM: 3.8 mmol/L (ref 3.5–5.2)
SODIUM: 139 mmol/L (ref 134–144)

## 2015-12-11 ENCOUNTER — Encounter: Payer: Self-pay | Admitting: Family Medicine

## 2016-01-19 ENCOUNTER — Ambulatory Visit (INDEPENDENT_AMBULATORY_CARE_PROVIDER_SITE_OTHER): Payer: Commercial Managed Care - PPO | Admitting: Family Medicine

## 2016-01-19 ENCOUNTER — Encounter: Payer: Self-pay | Admitting: Family Medicine

## 2016-01-19 VITALS — BP 103/69 | HR 102 | Temp 98.8°F | Wt 258.0 lb

## 2016-01-19 DIAGNOSIS — H65112 Acute and subacute allergic otitis media (mucoid) (sanguinous) (serous), left ear: Secondary | ICD-10-CM | POA: Diagnosis not present

## 2016-01-19 DIAGNOSIS — E119 Type 2 diabetes mellitus without complications: Secondary | ICD-10-CM | POA: Diagnosis not present

## 2016-01-19 DIAGNOSIS — J029 Acute pharyngitis, unspecified: Secondary | ICD-10-CM

## 2016-01-19 LAB — HEMOGLOBIN A1C: Hemoglobin A1C: 7.8

## 2016-01-19 MED ORDER — AMOXICILLIN 500 MG PO CAPS: 500.0000 mg | ORAL_CAPSULE | Freq: Two times a day (BID) | ORAL | 0 refills | Status: DC

## 2016-01-19 NOTE — Assessment & Plan Note (Signed)
Improved from 8.7 to 7.8! Continue current regimen. Continue to monitor. Call with any concerns. Recheck 3 months. Continue to work on diet and exercise.

## 2016-01-19 NOTE — Progress Notes (Signed)
BP 103/69 (BP Location: Left Arm, Patient Position: Sitting, Cuff Size: Large)   Pulse (!) 102   Temp 98.8 F (37.1 C)   Wt 258 lb (117 kg)   SpO2 95%   BMI 37.02 kg/m    Subjective:    Patient ID: Casey Cole, male    DOB: February 09, 1976, 40 y.o.   MRN: [id number]  HPI: Casey Cole is a 40 y.o. male  Chief Complaint  Patient presents with  . Diabetes   DIABETES Hypoglycemic episodes:no Polydipsia/polyuria: no Visual disturbance: no Chest pain: no Paresthesias: no Glucose Monitoring: no Taking Insulin?: no Blood Pressure Monitoring: not checking Retinal Examination: Not up to Date Foot Exam: Not up to Date Diabetic Education: Not Completed Pneumovax: Up to Date Influenza: Declined Aspirin: no  UPPER RESPIRATORY TRACT INFECTION Duration: 2-3 days Worst symptom: cough Fever: yes Cough: yes Shortness of breath: yes Wheezing: yes Chest pain: yes, with cough Chest tightness: yes Chest congestion: no Nasal congestion: yes Runny nose: yes Post nasal drip: yes Sneezing: yes Sore throat: no Swollen glands: no Sinus pressure: yes Headache: yes Face pain: yes Toothache: no Ear pain: no  Ear pressure: no  Eyes red/itching:no Eye drainage/crusting: no  Vomiting: no Rash: no Fatigue: yes Sick contacts: no Strep contacts: no  Context: worse Recurrent sinusitis: no Relief with OTC cold/cough medications: no  Treatments attempted: none   Relevant past medical, surgical, family and social history reviewed and updated as indicated. Interim medical history since our last visit reviewed. Allergies and medications reviewed and updated.  Review of Systems  Constitutional: Positive for fatigue and fever. Negative for activity change, appetite change, chills, diaphoresis and unexpected weight change.  HENT: Positive for congestion, postnasal drip, rhinorrhea, sinus pressure, sneezing and sore throat. Negative for dental problem, drooling, ear discharge, ear pain,  facial swelling, hearing loss, mouth sores, nosebleeds, tinnitus, trouble swallowing and voice change.   Respiratory: Positive for cough, shortness of breath and wheezing. Negative for apnea, choking, chest tightness and stridor.   Cardiovascular: Negative.   Psychiatric/Behavioral: Negative.     Per HPI unless specifically indicated above     Objective:    BP 103/69 (BP Location: Left Arm, Patient Position: Sitting, Cuff Size: Large)   Pulse (!) 102   Temp 98.8 F (37.1 C)   Wt 258 lb (117 kg)   SpO2 95%   BMI 37.02 kg/m   Wt Readings from Last 3 Encounters:  01/19/16 258 lb (117 kg)  12/08/15 255 lb (115.7 kg)  10/27/15 256 lb (116.1 kg)    Physical Exam  Constitutional: He is oriented to person, place, and time. He appears well-developed and well-nourished. No distress.  HENT:  Head: Normocephalic and atraumatic.  Right Ear: Hearing, tympanic membrane, external ear and ear canal normal.  Left Ear: Hearing, external ear and ear canal normal.  Ears:  Nose: Nose normal.  Mouth/Throat: Uvula is midline and mucous membranes are normal. Posterior oropharyngeal edema and posterior oropharyngeal erythema present. No oropharyngeal exudate or tonsillar abscesses.  Eyes: Conjunctivae, EOM and lids are normal. Pupils are equal, round, and reactive to light. Right eye exhibits no discharge. Left eye exhibits no discharge. No scleral icterus.  Neck: Normal range of motion. Neck supple. No JVD present. No tracheal deviation present. No thyromegaly present.  Cardiovascular: Normal rate, regular rhythm and intact distal pulses.  Exam reveals no gallop and no friction rub.   No murmur heard. Pulmonary/Chest: Effort normal and breath sounds normal. No stridor. No respiratory distress. He  has no wheezes. He has no rales. He exhibits no tenderness.  Musculoskeletal: Normal range of motion.  Lymphadenopathy:    He has cervical adenopathy.  Neurological: He is alert and oriented to person,  place, and time.  Skin: Skin is warm and intact. No rash noted. He is not diaphoretic. No erythema. No pallor.  Psychiatric: He has a normal mood and affect. His speech is normal and behavior is normal. Judgment and thought content normal. Cognition and memory are normal.  Vitals reviewed.   Results for orders placed or performed in visit on [id number]  Basic metabolic panel  Result Value Ref Range   Glucose 125 (H) 65 - 99 mg/dL   BUN 13 6 - 20 mg/dL   Creatinine, Ser 0.90 0.76 - 1.27 mg/dL   GFR calc non Af Amer 107 >59 mL/min/1.73   GFR calc Af Amer 124 >59 mL/min/1.73   BUN/Creatinine Ratio 14 9 - 20   Sodium 139 134 - 144 mmol/L   Potassium 3.8 3.5 - 5.2 mmol/L   Chloride 97 96 - 106 mmol/L   CO2 23 18 - 29 mmol/L   Calcium 9.7 8.7 - 10.2 mg/dL      Assessment & Plan:   Problem List Items Addressed This Visit      Endocrine   Diabetes mellitus without complication ([place]) - Primary    Improved from 8.7 to 7.8! Continue current regimen. Continue to monitor. Call with any concerns. Recheck 3 months. Continue to work on diet and exercise.       Relevant Orders   Bayer DCA Hb A1c Waived    Other Visit Diagnoses    Sore throat       Negative strep.   Relevant Orders   Rapid strep screen (not at [facility])   Acute mucoid otitis media of left ear       Will treat him with amoxicillin. Let us know if not getting better or getting worse. Call with concerns.    Relevant Medications   amoxicillin (AMOXIL) 500 MG capsule       Follow up plan: No Follow-up on file.

## 2016-01-22 ENCOUNTER — Other Ambulatory Visit: Payer: Self-pay | Admitting: Family Medicine

## 2016-01-22 LAB — CULTURE, GROUP A STREP: Strep A Culture: NEGATIVE

## 2016-01-22 LAB — RAPID STREP SCREEN (MED CTR MEBANE ONLY): Strep Gp A Ag, IA W/Reflex: NEGATIVE

## 2016-01-22 LAB — BAYER DCA HB A1C WAIVED: HB A1C (BAYER DCA - WAIVED): 7.8 % — ABNORMAL HIGH (ref <7.0)

## 2016-01-23 NOTE — Telephone Encounter (Signed)
rx

## 2016-04-19 ENCOUNTER — Ambulatory Visit: Payer: Commercial Managed Care - PPO | Admitting: Family Medicine

## 2016-05-02 ENCOUNTER — Encounter: Payer: Self-pay | Admitting: Family Medicine

## 2016-05-02 ENCOUNTER — Ambulatory Visit (INDEPENDENT_AMBULATORY_CARE_PROVIDER_SITE_OTHER): Payer: Commercial Managed Care - PPO | Admitting: Family Medicine

## 2016-05-02 VITALS — BP 129/92 | HR 109 | Temp 97.4°F | Wt 260.4 lb

## 2016-05-02 DIAGNOSIS — H66002 Acute suppurative otitis media without spontaneous rupture of ear drum, left ear: Secondary | ICD-10-CM | POA: Diagnosis not present

## 2016-05-02 DIAGNOSIS — J40 Bronchitis, not specified as acute or chronic: Secondary | ICD-10-CM | POA: Diagnosis not present

## 2016-05-02 MED ORDER — AMOXICILLIN 500 MG PO CAPS: 500.0000 mg | ORAL_CAPSULE | Freq: Two times a day (BID) | ORAL | 0 refills | Status: DC

## 2016-05-02 MED ORDER — HYDROCOD POLST-CPM POLST ER 10-8 MG/5ML PO SUER: 5.0000 mL | Freq: Every evening | ORAL | 0 refills | Status: DC | PRN

## 2016-05-02 MED ORDER — PREDNISONE 10 MG PO TABS: ORAL_TABLET | ORAL | 0 refills | Status: DC

## 2016-05-02 NOTE — Progress Notes (Signed)
BP (!) 129/92 (BP Location: Left Arm, Patient Position: Sitting, Cuff Size: Large)   Pulse (!) 109   Temp 97.4 F (36.3 C)   Wt 260 lb 6.4 oz (118.1 kg)   SpO2 95%   BMI 37.36 kg/m    Subjective:    Patient ID: Casey Cole, male    DOB: 02/03/1976, 40 y.o.   MRN: [id number]  HPI: Casey Cole is a 40 y.o. male  Chief Complaint  Patient presents with  . Cough    X 2 days, chest and nasal congestion and sinus pressure   UPPER RESPIRATORY TRACT INFECTION Duration: 2 days Worst symptom: cough Fever: no Cough: yes Shortness of breath: yes Wheezing: yes Chest pain: yes, with cough Chest tightness: no Chest congestion: yes Nasal congestion: yes Runny nose: yes Post nasal drip: yes Sneezing: yes Sore throat: yes Swollen glands: yes Sinus pressure: yes Headache: yes Face pain: no Toothache: no Ear pain: no Ear pressure: yes bilateral Eyes red/itching:no Eye drainage/crusting: no  Vomiting: no Rash: no Fatigue: yes Sick contacts: yes Strep contacts: no  Context: worse Recurrent sinusitis: no Relief with OTC cold/cough medications: no  Treatments attempted: Dayquil    Relevant past medical, surgical, family and social history reviewed and updated as indicated. Interim medical history since our last visit reviewed. Allergies and medications reviewed and updated.  Review of Systems  Constitutional: Negative.   HENT: Positive for congestion, ear pain, postnasal drip, sinus pain, sinus pressure, sneezing and sore throat. Negative for dental problem, drooling, ear discharge, facial swelling, hearing loss, mouth sores, nosebleeds, rhinorrhea, tinnitus, trouble swallowing and voice change.   Respiratory: Positive for cough, shortness of breath and wheezing. Negative for apnea, choking, chest tightness and stridor.   Cardiovascular: Negative.   Psychiatric/Behavioral: Negative.     Per HPI unless specifically indicated above     Objective:    BP (!) 129/92 (BP  Location: Left Arm, Patient Position: Sitting, Cuff Size: Large)   Pulse (!) 109   Temp 97.4 F (36.3 C)   Wt 260 lb 6.4 oz (118.1 kg)   SpO2 95%   BMI 37.36 kg/m   Wt Readings from Last 3 Encounters:  05/02/16 260 lb 6.4 oz (118.1 kg)  01/19/16 258 lb (117 kg)  12/08/15 255 lb (115.7 kg)    Physical Exam  Constitutional: He is oriented to person, place, and time. He appears well-developed and well-nourished. No distress.  HENT:  Head: Normocephalic and atraumatic.  Right Ear: Hearing, external ear and ear canal normal. Tympanic membrane is bulging.  Left Ear: Hearing normal. Tympanic membrane is erythematous and bulging.  Ears:  Nose: Nose normal.  Mouth/Throat: Oropharynx is clear and moist. No oropharyngeal exudate.  Eyes: Conjunctivae, EOM and lids are normal. Pupils are equal, round, and reactive to light. Right eye exhibits no discharge. Left eye exhibits no discharge. No scleral icterus.  Neck: Normal range of motion. Neck supple. No JVD present. No tracheal deviation present. No thyromegaly present.  Cardiovascular: Normal rate, regular rhythm, normal heart sounds and intact distal pulses.  Exam reveals no gallop and no friction rub.   No murmur heard. Pulmonary/Chest: Effort normal. No stridor. No respiratory distress. He has wheezes. He has no rales. He exhibits no tenderness.  Musculoskeletal: Normal range of motion.  Lymphadenopathy:    He has no cervical adenopathy.  Neurological: He is alert and oriented to person, place, and time.  Skin: Skin is intact. No rash noted. He is not diaphoretic.  Psychiatric: He  has a normal mood and affect. His speech is normal and behavior is normal. Judgment and thought content normal. Cognition and memory are normal.  Nursing note and vitals reviewed.   Results for orders placed or performed in visit on 01/19/16  Rapid strep screen (not at [facility])  Result Value Ref Range   Strep Gp A Ag, IA W/Reflex Negative Negative  Culture,  Group A Strep  Result Value Ref Range   Strep A Culture Negative   Hemoglobin A1c  Result Value Ref Range   Hemoglobin A1C 7.8   Bayer DCA Hb A1c Waived  Result Value Ref Range   Bayer DCA Hb A1c Waived 7.8 (H) <7.0 %      Assessment & Plan:   Problem List Items Addressed This Visit    None    Visit Diagnoses    Acute suppurative otitis media of left ear without spontaneous rupture of tympanic membrane, recurrence not specified    -  Primary   Will treat with amoxicillin. Call with any concern, recheck 2 weeks at follow up   Relevant Medications   amoxicillin (AMOXIL) 500 MG capsule   Bronchitis       Prednisone and tussionex. Rest. Recheck 2 weeks.        Follow up plan: Return As scheduled, for Lung/ear recheck and DM visit.

## 2016-05-14 ENCOUNTER — Encounter: Payer: Self-pay | Admitting: Family Medicine

## 2016-05-14 ENCOUNTER — Ambulatory Visit (INDEPENDENT_AMBULATORY_CARE_PROVIDER_SITE_OTHER): Payer: Commercial Managed Care - PPO | Admitting: Family Medicine

## 2016-05-14 VITALS — BP 124/81 | HR 103 | Temp 98.1°F | Wt 258.9 lb

## 2016-05-14 DIAGNOSIS — E119 Type 2 diabetes mellitus without complications: Secondary | ICD-10-CM

## 2016-05-14 LAB — BAYER DCA HB A1C WAIVED: HB A1C (BAYER DCA - WAIVED): 8.4 % — ABNORMAL HIGH (ref <7.0)

## 2016-05-14 NOTE — Assessment & Plan Note (Signed)
A1c up to 8.4- will work on diet and exercise and recheck in 3 months. Call with any concerns.

## 2016-05-14 NOTE — Progress Notes (Signed)
BP 124/81 (BP Location: Left Arm, Patient Position: Sitting, Cuff Size: Large)   Pulse (!) 103   Temp 98.1 F (36.7 C)   Wt 258 lb 14.4 oz (117.4 kg)   SpO2 95%   BMI 37.15 kg/m    Subjective:    Patient ID: Casey Cole, male    DOB: 04-27-1976, 41 y.o.   MRN: [id number]  HPI: Casey Cole is a 41 y.o. male  Chief Complaint  Patient presents with  . Diabetes   Ear feels better.   DIABETES Hypoglycemic episodes:no Polydipsia/polyuria: no Visual disturbance: no Chest pain: no Paresthesias: no Glucose Monitoring: yes  Accucheck frequency: Every couple of days  Fasting glucose: 140s Taking Insulin?: no Blood Pressure Monitoring: not checking Retinal Examination: Up to Date Foot Exam: Up to Date Diabetic Education: Completed Pneumovax: Up to Date Influenza: Up to Date Aspirin: no  Relevant past medical, surgical, family and social history reviewed and updated as indicated. Interim medical history since our last visit reviewed. Allergies and medications reviewed and updated.  Review of Systems  Constitutional: Negative.   Respiratory: Negative.   Cardiovascular: Negative.   Psychiatric/Behavioral: Negative.     Per HPI unless specifically indicated above     Objective:    BP 124/81 (BP Location: Left Arm, Patient Position: Sitting, Cuff Size: Large)   Pulse (!) 103   Temp 98.1 F (36.7 C)   Wt 258 lb 14.4 oz (117.4 kg)   SpO2 95%   BMI 37.15 kg/m   Wt Readings from Last 3 Encounters:  05/14/16 258 lb 14.4 oz (117.4 kg)  05/02/16 260 lb 6.4 oz (118.1 kg)  01/19/16 258 lb (117 kg)    Physical Exam  Constitutional: He is oriented to person, place, and time. He appears well-developed and well-nourished. No distress.  HENT:  Head: Normocephalic and atraumatic.  Right Ear: Hearing and external ear normal.  Left Ear: Hearing and external ear normal.  Nose: Nose normal.  Mouth/Throat: Oropharynx is clear and moist. No oropharyngeal exudate.  Eyes:  Conjunctivae and lids are normal. Right eye exhibits no discharge. Left eye exhibits no discharge. No scleral icterus.  Cardiovascular: Normal rate, regular rhythm, normal heart sounds and intact distal pulses.  Exam reveals no gallop and no friction rub.   No murmur heard. Pulmonary/Chest: Effort normal and breath sounds normal. No respiratory distress. He has no wheezes. He has no rales. He exhibits no tenderness.  Musculoskeletal: Normal range of motion.  Neurological: He is alert and oriented to person, place, and time.  Skin: Skin is intact. No rash noted. He is not diaphoretic.  Psychiatric: He has a normal mood and affect. His speech is normal and behavior is normal. Judgment and thought content normal. Cognition and memory are normal.  Nursing note and vitals reviewed.   Results for orders placed or performed in visit on 01/19/16  Rapid strep screen (not at [facility])  Result Value Ref Range   Strep Gp A Ag, IA W/Reflex Negative Negative  Culture, Group A Strep  Result Value Ref Range   Strep A Culture Negative   Hemoglobin A1c  Result Value Ref Range   Hemoglobin A1C 7.8   Bayer DCA Hb A1c Waived  Result Value Ref Range   Bayer DCA Hb A1c Waived 7.8 (H) <7.0 %      Assessment & Plan:   Problem List Items Addressed This Visit      Endocrine   Diabetes mellitus without complication ([place]) - Primary  A1c up to 8.4- will work on diet and exercise and recheck in 3 months. Call with any concerns.       Relevant Orders   Bayer DCA Hb A1c Waived       Follow up plan: Return in about 3 months (around 08/12/2016) for DM visit.

## 2016-06-28 DIAGNOSIS — Z79899 Other long term (current) drug therapy: Secondary | ICD-10-CM | POA: Diagnosis not present

## 2016-06-28 DIAGNOSIS — L400 Psoriasis vulgaris: Secondary | ICD-10-CM | POA: Diagnosis not present

## 2016-09-12 ENCOUNTER — Other Ambulatory Visit: Payer: Self-pay | Admitting: Family Medicine

## 2016-09-13 NOTE — Telephone Encounter (Signed)
Last OV: 05/14/16 Next OV: 09/26/16   Lab Results  Component Value Date   HGBA1C 7.8 01/19/2016

## 2016-09-26 ENCOUNTER — Ambulatory Visit: Payer: Commercial Managed Care - PPO | Admitting: Family Medicine

## 2016-10-28 ENCOUNTER — Encounter: Payer: Self-pay | Admitting: Family Medicine

## 2016-10-28 ENCOUNTER — Ambulatory Visit (INDEPENDENT_AMBULATORY_CARE_PROVIDER_SITE_OTHER): Payer: Commercial Managed Care - PPO | Admitting: Family Medicine

## 2016-10-28 VITALS — BP 121/80 | HR 104 | Temp 98.2°F | Ht 68.7 in | Wt 256.5 lb

## 2016-10-28 DIAGNOSIS — F3341 Major depressive disorder, recurrent, in partial remission: Secondary | ICD-10-CM | POA: Diagnosis not present

## 2016-10-28 DIAGNOSIS — E782 Mixed hyperlipidemia: Secondary | ICD-10-CM

## 2016-10-28 DIAGNOSIS — Z0000 Encounter for general adult medical examination without abnormal findings: Secondary | ICD-10-CM | POA: Diagnosis not present

## 2016-10-28 DIAGNOSIS — Z3009 Encounter for other general counseling and advice on contraception: Secondary | ICD-10-CM

## 2016-10-28 DIAGNOSIS — I129 Hypertensive chronic kidney disease with stage 1 through stage 4 chronic kidney disease, or unspecified chronic kidney disease: Secondary | ICD-10-CM | POA: Diagnosis not present

## 2016-10-28 DIAGNOSIS — E119 Type 2 diabetes mellitus without complications: Secondary | ICD-10-CM | POA: Diagnosis not present

## 2016-10-28 MED ORDER — DAPAGLIFLOZIN PRO-METFORMIN ER 5-1000 MG PO TB24: 1.0000 | ORAL_TABLET | Freq: Two times a day (BID) | ORAL | 6 refills | Status: DC

## 2016-10-28 NOTE — Assessment & Plan Note (Signed)
Under good control off medicine. Continue to monitor. Call with any concerns.

## 2016-10-28 NOTE — Progress Notes (Signed)
BP 121/80 (BP Location: Right Arm, Patient Position: Sitting, Cuff Size: Large)   Pulse (!) 104   Temp 98.2 F (36.8 C)   Ht 5' 8.7" (1.745 m)   Wt 256 lb 8 oz (116.3 kg)   SpO2 96%   BMI 38.21 kg/m    Subjective:    Patient ID: Casey Cole, male    DOB: Apr 19, 1976, 41 y.o.   MRN: [id number]  HPI: Casey Cole is a 41 y.o. male presenting on 10/28/2016 for comprehensive medical examination. Current medical complaints include:  HYPERTENSION / HYPERLIPIDEMIA Satisfied with current treatment? yes Duration of hypertension: chronic BP monitoring frequency: not checking BP medication side effects: no Duration of hyperlipidemia: chronic Cholesterol medication side effects: not on anything Cholesterol supplements: none Past cholesterol medications: none Medication compliance: excellent compliance Aspirin: no Recent stressors: no Recurrent headaches: no Visual changes: no Palpitations: no Dyspnea: no Chest pain: no Lower extremity edema: no Dizzy/lightheaded: no  DIABETES Hypoglycemic episodes:no Polydipsia/polyuria: no Visual disturbance: no Chest pain: no Paresthesias: no Glucose Monitoring: yes  Accucheck frequency: Daily- 98-120s Taking Insulin?: no Blood Pressure Monitoring: not checking Retinal Examination: Not up to Date Foot Exam: Up to Date Diabetic Education: Completed Pneumovax: Up to Date Influenza: Up to Date Aspirin: no  He currently lives with: wife Interim Problems from his last visit: no  Depression Screen done today and results listed below:  Depression screen [facility] 2/9 10/28/2016 12/08/2015 10/27/2015  Decreased Interest 0 2 2  Down, Depressed, Hopeless 0 2 2  PHQ - 2 Score 0 4 4  Altered sleeping - 0 2  Tired, decreased energy - 2 2  Change in appetite - 2 2  Feeling bad or failure about yourself  - 1 1  Trouble concentrating - 2 1  Moving slowly or fidgety/restless - 1 1  Suicidal thoughts - 0 0  PHQ-9 Score - 12 13  Difficult doing  work/chores - Somewhat difficult Very difficult   Past Medical History:  Past Medical History:  Diagnosis Date  . Diabetes mellitus without complication ([place])   . Hyperlipidemia   . Hypertension   . Sleep apnea     Surgical History:  Past Surgical History:  Procedure Laterality Date  . CHOLECYSTECTOMY    . INCISE AND DRAIN ABCESS Left 07-12-15   left buttock  . INCISION AND DRAINAGE PERIRECTAL ABSCESS N/A 08/23/2015   Procedure: IRRIGATION AND DEBRIDEMENT PERIRECTAL ABSCESS;  Surgeon: Robert Bellow, MD;  Location: [facility] ORS;  Service: General;  Laterality: N/A;  Anal Scope    Medications:  Current Outpatient Prescriptions on File Prior to Visit  Medication Sig  . ONE TOUCH ULTRA TEST test strip USE AS DIRECTED ONCE DAILY  . TALTZ 80 MG/ML SOAJ    No current facility-administered medications on file prior to visit.     Allergies:  No Known Allergies  Social History:  Social History   Social History  . Marital status: Married    Spouse name: N/A  . Number of children: N/A  . Years of education: N/A   Occupational History  . Not on file.   Social History Main Topics  . Smoking status: Current Every Day Smoker    Packs/day: 1.00    Types: Cigarettes  . Smokeless tobacco: Never Used  . Alcohol use 1.8 oz/week    3 Shots of liquor per week  . Drug use: No  . Sexual activity: Yes   Other Topics Concern  . Not on file   Social History  Narrative  . No narrative on file   History  Smoking Status  . Current Every Day Smoker  . Packs/day: 1.00  . Types: Cigarettes  Smokeless Tobacco  . Never Used   History  Alcohol Use  . 1.8 oz/week  . 3 Shots of liquor per week    Family History:  Family History  Problem Relation Age of Onset  . Hypertension Father   . Stroke Paternal Grandfather   . Heart attack Paternal Grandfather     Past medical history, surgical history, medications, allergies, family history and social history reviewed with patient today  and changes made to appropriate areas of the chart.   Review of Systems  Constitutional: Negative.   HENT: Negative.   Eyes: Negative.   Respiratory: Negative.   Cardiovascular: Negative.   Gastrointestinal: Negative.   Genitourinary: Negative.   Musculoskeletal: Negative.   Skin: Negative.   Neurological: Negative.   Endo/Heme/Allergies: Negative.   Psychiatric/Behavioral: Negative.     All other ROS negative except what is listed above and in the HPI.      Objective:    BP 121/80 (BP Location: Right Arm, Patient Position: Sitting, Cuff Size: Large)   Pulse (!) 104   Temp 98.2 F (36.8 C)   Ht 5' 8.7" (1.745 m)   Wt 256 lb 8 oz (116.3 kg)   SpO2 96%   BMI 38.21 kg/m   Wt Readings from Last 3 Encounters:  10/28/16 256 lb 8 oz (116.3 kg)  05/14/16 258 lb 14.4 oz (117.4 kg)  05/02/16 260 lb 6.4 oz (118.1 kg)    Physical Exam  Constitutional: He is oriented to person, place, and time. He appears well-developed and well-nourished. No distress.  HENT:  Head: Normocephalic and atraumatic.  Right Ear: Hearing, tympanic membrane, external ear and ear canal normal.  Left Ear: Hearing, tympanic membrane, external ear and ear canal normal.  Nose: Nose normal.  Mouth/Throat: Uvula is midline, oropharynx is clear and moist and mucous membranes are normal. No oropharyngeal exudate.  Eyes: Conjunctivae, EOM and lids are normal. Pupils are equal, round, and reactive to light. Right eye exhibits no discharge. Left eye exhibits no discharge. No scleral icterus.  Neck: Normal range of motion. Neck supple. No JVD present. No tracheal deviation present. No thyromegaly present.  Cardiovascular: Normal rate, regular rhythm, normal heart sounds and intact distal pulses.  Exam reveals no gallop and no friction rub.   No murmur heard. Pulmonary/Chest: Effort normal and breath sounds normal. No stridor. No respiratory distress. He has no wheezes. He has no rales. He exhibits no tenderness.    Abdominal: Soft. Bowel sounds are normal. He exhibits no distension and no mass. There is no tenderness. There is no rebound and no guarding.  Genitourinary:  Genitourinary Comments: Deferred- going to urology  Musculoskeletal: Normal range of motion. He exhibits no edema, tenderness or deformity.  Lymphadenopathy:    He has no cervical adenopathy.  Neurological: He is alert and oriented to person, place, and time. He has normal reflexes. He displays normal reflexes. No cranial nerve deficit. He exhibits normal muscle tone. Coordination normal.  Skin: Skin is warm, dry and intact. No rash noted. He is not diaphoretic. No erythema. No pallor.  Psychiatric: He has a normal mood and affect. His speech is normal and behavior is normal. Judgment and thought content normal. Cognition and memory are normal.  Nursing note and vitals reviewed.   Results for orders placed or performed in visit on 05/14/16  Bayer DCA Hb A1c Waived  Result Value Ref Range   Bayer DCA Hb A1c Waived 8.4 (H) <7.0 %      Assessment & Plan:   Problem List Items Addressed This Visit      Endocrine   Diabetes mellitus without complication ([place])    Under good control with A1c of 7.9. Continue current regimen. Continue to monitor. Call with any concerns. Recheck 6 months.      Relevant Medications   Dapagliflozin-Metformin HCl ER (XIGDUO XR) 09-998 MG TB24   Other Relevant Orders   Bayer DCA Hb A1c Waived   CBC with Differential/Platelet   Comprehensive metabolic panel   TSH   Microalbumin, Urine Waived   UA/M w/rflx Culture, Routine     Genitourinary   Benign hypertensive renal disease    Under good control . Continue current regimen. Continue to monitor. Call with any concerns. Recheck 6 months.      Relevant Orders   CBC with Differential/Platelet   Comprehensive metabolic panel   TSH   Microalbumin, Urine Waived   UA/M w/rflx Culture, Routine     Other   Hyperlipidemia    Rechecking levels today.  Await results. Call with any concerns.       Relevant Orders   CBC with Differential/Platelet   Comprehensive metabolic panel   Lipid Panel w/o Chol/HDL Ratio   UA/M w/rflx Culture, Routine   Depression    Under good control off medicine. Continue to monitor. Call with any concerns.       Relevant Orders   CBC with Differential/Platelet   Comprehensive metabolic panel   UA/M w/rflx Culture, Routine    Other Visit Diagnoses    Routine general medical examination at a health care facility    -  Primary   Vaccines up to date. Screening labs checked today. Continue diet and exercise. Call with any concerns.    Relevant Orders   Bayer DCA Hb A1c Waived   CBC with Differential/Platelet   Comprehensive metabolic panel   Lipid Panel w/o Chol/HDL Ratio   TSH   Microalbumin, Urine Waived   UA/M w/rflx Culture, Routine   Vasectomy evaluation       Referral to urology made today. Call with any concerns.    Relevant Orders   Ambulatory referral to Urology       LABORATORY TESTING:  Health maintenance labs ordered today as discussed above.   IMMUNIZATIONS:   - Tdap: Tetanus vaccination status reviewed: last tetanus booster within 10 years. - Influenza: Postponed to flu season - Pneumovax: Up to date  PATIENT COUNSELING:    Sexuality: Discussed sexually transmitted diseases, partner selection, use of condoms, avoidance of unintended pregnancy  and contraceptive alternatives.   Advised to avoid cigarette smoking.  I discussed with the patient that most people either abstain from alcohol or drink within safe limits (<=14/week and <=4 drinks/occasion for males, <=7/weeks and <= 3 drinks/occasion for females) and that the risk for alcohol disorders and other health effects rises proportionally with the number of drinks per week and how often a drinker exceeds daily limits.  Discussed cessation/primary prevention of drug use and availability of treatment for abuse.   Diet: Encouraged  to adjust caloric intake to maintain  or achieve ideal body weight, to reduce intake of dietary saturated fat and total fat, to limit sodium intake by avoiding high sodium foods and not adding table salt, and to maintain adequate dietary potassium and calcium preferably from fresh fruits, vegetables, and  low-fat dairy products.    stressed the importance of regular exercise  Injury prevention: Discussed safety belts, safety helmets, smoke detector, smoking near bedding or upholstery.   Dental health: Discussed importance of regular tooth brushing, flossing, and dental visits.   Follow up plan: NEXT PREVENTATIVE PHYSICAL DUE IN 1 YEAR. Return in about 6 months (around 04/29/2017) for DM/Chol/BP.

## 2016-10-28 NOTE — Patient Instructions (Addendum)

## 2016-10-28 NOTE — Assessment & Plan Note (Signed)
Under good control. Continue current regimen. Continue to monitor. Call with any concerns. Recheck 6 months.  

## 2016-10-28 NOTE — Assessment & Plan Note (Addendum)
Under good control with A1c of 7.9. Continue current regimen. Continue to monitor. Call with any concerns. Recheck 6 months.

## 2016-10-28 NOTE — Assessment & Plan Note (Signed)
Rechecking levels today. Await results. Call with any concerns.  

## 2016-10-29 LAB — COMPREHENSIVE METABOLIC PANEL
A/G RATIO: 1.8 (ref 1.2–2.2)
ALT: 35 IU/L (ref 0–44)
AST: 21 IU/L (ref 0–40)
Albumin: 4.5 g/dL (ref 3.5–5.5)
Alkaline Phosphatase: 80 IU/L (ref 39–117)
BUN / CREAT RATIO: 8 — AB (ref 9–20)
BUN: 8 mg/dL (ref 6–24)
Bilirubin Total: 1.3 mg/dL — ABNORMAL HIGH (ref 0.0–1.2)
CALCIUM: 9.3 mg/dL (ref 8.7–10.2)
CO2: 24 mmol/L (ref 20–29)
Chloride: 99 mmol/L (ref 96–106)
Creatinine, Ser: 1.01 mg/dL (ref 0.76–1.27)
GFR, EST AFRICAN AMERICAN: 107 mL/min/{1.73_m2} (ref >59)
GFR, EST NON AFRICAN AMERICAN: 93 mL/min/{1.73_m2} (ref >59)
GLOBULIN, TOTAL: 2.5 g/dL (ref 1.5–4.5)
Glucose: 108 mg/dL — ABNORMAL HIGH (ref 65–99)
POTASSIUM: 3.9 mmol/L (ref 3.5–5.2)
SODIUM: 138 mmol/L (ref 134–144)
TOTAL PROTEIN: 7 g/dL (ref 6.0–8.5)

## 2016-10-29 LAB — LIPID PANEL W/O CHOL/HDL RATIO
Cholesterol, Total: 199 mg/dL (ref 100–199)
HDL: 34 mg/dL — ABNORMAL LOW (ref >39)
LDL Calculated: 104 mg/dL — ABNORMAL HIGH (ref 0–99)
Triglycerides: 303 mg/dL — ABNORMAL HIGH (ref 0–149)
VLDL Cholesterol Cal: 61 mg/dL — ABNORMAL HIGH (ref 5–40)

## 2016-10-29 LAB — CBC WITH DIFFERENTIAL/PLATELET
BASOS: 0 %
Basophils Absolute: 0 10*3/uL (ref 0.0–0.2)
EOS (ABSOLUTE): 0.2 10*3/uL (ref 0.0–0.4)
EOS: 2 %
HEMATOCRIT: 50 % (ref 37.5–51.0)
Hemoglobin: 17.1 g/dL (ref 13.0–17.7)
IMMATURE GRANULOCYTES: 0 %
Immature Grans (Abs): 0 10*3/uL (ref 0.0–0.1)
Lymphocytes Absolute: 3.1 10*3/uL (ref 0.7–3.1)
Lymphs: 32 %
MCH: 31.5 pg (ref 26.6–33.0)
MCHC: 34.2 g/dL (ref 31.5–35.7)
MCV: 92 fL (ref 79–97)
MONOS ABS: 0.1 10*3/uL (ref 0.1–0.9)
Monocytes: 1 %
NEUTROS PCT: 65 %
Neutrophils Absolute: 6.4 10*3/uL (ref 1.4–7.0)
Platelets: 175 10*3/uL (ref 150–379)
RBC: 5.42 x10E6/uL (ref 4.14–5.80)
RDW: 13.9 % (ref 12.3–15.4)
WBC: 9.7 10*3/uL (ref 3.4–10.8)

## 2016-10-29 LAB — UA/M W/RFLX CULTURE, ROUTINE

## 2016-10-29 LAB — TSH: TSH: 0.685 u[IU]/mL (ref 0.450–4.500)

## 2016-10-29 LAB — BAYER DCA HB A1C WAIVED: HB A1C (BAYER DCA - WAIVED): 7.9 % — ABNORMAL HIGH (ref <7.0)

## 2016-11-01 ENCOUNTER — Telehealth: Payer: Self-pay | Admitting: Family Medicine

## 2016-11-01 NOTE — Telephone Encounter (Signed)
Printed and placed at front desk for pickup. Pt aware.

## 2016-11-01 NOTE — Telephone Encounter (Signed)
Patient lost his form showing proof of his physical that was filled out during last visit. Patient's paperwork is due at work today. Patient would like a copy of paperwork so he can turn into work.  Please Advise.  Thank you

## 2016-11-18 ENCOUNTER — Ambulatory Visit (INDEPENDENT_AMBULATORY_CARE_PROVIDER_SITE_OTHER): Payer: Commercial Managed Care - PPO | Admitting: Urology

## 2016-11-18 ENCOUNTER — Encounter: Payer: Self-pay | Admitting: Urology

## 2016-11-18 VITALS — BP 125/75 | HR 103 | Ht 68.0 in | Wt 255.0 lb

## 2016-11-18 DIAGNOSIS — Z3009 Encounter for other general counseling and advice on contraception: Secondary | ICD-10-CM | POA: Diagnosis not present

## 2016-11-18 MED ORDER — DIAZEPAM 10 MG PO TABS: 10.0000 mg | ORAL_TABLET | Freq: Once | ORAL | 0 refills | Status: AC

## 2016-11-18 NOTE — Progress Notes (Signed)
11/18/2016 4:21 PM   [name] 03/03/1976 [id number]  Referring provider: Valerie Roys, DO [place], [place] [address]  Chief Complaint  Patient presents with  . VAS Consult    New Patient    HPI: 41 year old male who presents today for discussion of vasectomy. The patient has one child and he and his wife have agreed that they're no longer interested in additional children. The patient's wife's IUD is scheduled to be changed in the near future and rather than do that, the patient would like to have the vasectomy performed.  The patient denies any history of scrotal pain, inguinal hernia, or testicular infections.  The patient has a past surgical history of pilonidal cyst excision.     PMH: Past Medical History:  Diagnosis Date  . Diabetes mellitus without complication ([place])   . Hyperlipidemia   . Hypertension   . Sleep apnea     Surgical History: Past Surgical History:  Procedure Laterality Date  . CHOLECYSTECTOMY    . INCISE AND DRAIN ABCESS Left 07-12-15   left buttock  . INCISION AND DRAINAGE PERIRECTAL ABSCESS N/A 08/23/2015   Procedure: IRRIGATION AND DEBRIDEMENT PERIRECTAL ABSCESS;  Surgeon: Robert Bellow, MD;  Location: [facility] ORS;  Service: General;  Laterality: N/A;  Anal Scope    Home Medications:  Allergies as of 11/18/2016   No Known Allergies     Medication List       Accurate as of 11/18/16  4:21 PM. Always use your most recent med list.          Dapagliflozin-Metformin HCl ER 09-998 MG Tb24 Commonly known as:  XIGDUO XR Take 1 tablet by mouth 2 (two) times daily.   diazepam 10 MG tablet Commonly known as:  VALIUM Take 1-2 tablets (10-20 mg total) by mouth once. One hour prior to procedure   ONE TOUCH ULTRA TEST test strip Generic drug:  glucose blood USE AS DIRECTED ONCE DAILY   TALTZ 80 MG/ML Soaj Generic drug:  Ixekizumab       Allergies: No Known Allergies  Family History: Family History  Problem Relation Age of  Onset  . Hypertension Father   . Stroke Paternal Grandfather   . Heart attack Paternal Grandfather     Social History:  reports that he has been smoking Cigarettes.  He has been smoking about 1.00 pack per day. He has never used smokeless tobacco. He reports that he drinks about 1.8 oz of alcohol per week . He reports that he does not use drugs.  ROS: UROLOGY Frequent Urination?: No Hard to postpone urination?: No Burning/pain with urination?: No Get up at night to urinate?: Yes Leakage of urine?: No Urine stream starts and stops?: No Trouble starting stream?: No Do you have to strain to urinate?: No Blood in urine?: No Urinary tract infection?: No Sexually transmitted disease?: No Injury to kidneys or bladder?: No Painful intercourse?: No Weak stream?: No Erection problems?: No Penile pain?: No  Gastrointestinal Nausea?: No Vomiting?: No Indigestion/heartburn?: No Diarrhea?: No Constipation?: No  Constitutional Fever: No Night sweats?: Yes Weight loss?: No Fatigue?: Yes  Skin Skin rash/lesions?: No Itching?: No  Eyes Blurred vision?: No Double vision?: No  Ears/Nose/Throat Sore throat?: No Sinus problems?: No  Hematologic/Lymphatic Swollen glands?: No Easy bruising?: No  Cardiovascular Leg swelling?: No Chest pain?: No  Respiratory Cough?: No Shortness of breath?: No  Endocrine Excessive thirst?: No  Musculoskeletal Back pain?: No Joint pain?: No  Neurological Headaches?: No Dizziness?:  No  Psychologic Depression?: No Anxiety?: No  Physical Exam: BP 125/75   Pulse (!) 103   Ht 5\' 8"  (1.727 m)   Wt 115.7 kg (255 lb)   BMI 38.77 kg/m   Constitutional:  Alert and oriented, No acute distress. HEENT: [place] AT, moist mucus membranes.  Trachea midline, no masses. Cardiovascular: No clubbing, cyanosis, or edema. Respiratory: Normal respiratory effort, no increased work of breathing. GI: Abdomen is soft, nontender, nondistended, no  abdominal masses GU: No CVA tenderness. Slightly buried penis, normal scrotum and testicles. Vas deferens are palpable bilaterally Skin: No rashes, bruises or suspicious lesions. Lymph: No cervical or inguinal adenopathy. Neurologic: Grossly intact, no focal deficits, moving all 4 extremities. Psychiatric: Normal mood and affect.  Laboratory Data: Lab Results  Component Value Date   WBC 9.7 10/28/2016   HGB 17.1 10/28/2016   HCT 50.0 10/28/2016   MCV 92 10/28/2016   PLT 175 10/28/2016    Lab Results  Component Value Date   CREATININE 1.01 10/28/2016    No results found for: PSA  No results found for: TESTOSTERONE  Lab Results  Component Value Date   HGBA1C 7.8 01/19/2016    Urinalysis    Component Value Date/Time   APPEARANCEUR Clear 10/27/2015 1023   GLUCOSEU CANCELED 10/28/2016 1517   BILIRUBINUR Negative 10/27/2015 1023   PROTEINUR CANCELED 10/28/2016 1517   NITRITE Negative 10/27/2015 1023   LEUKOCYTESUR Negative 10/27/2015 1023    Pertinent Imaging: None  Assessment & Plan:  Patient desires vasectomy, no contraindications to performing the office-based procedure.  1. Encounter for male family planning counseling I discussed the procedure in significant detail with the patient. We discussed the risks of bleeding and infection. We discussed the risk of sperm granuloma, and recanalization/failure. We discussed the risk of chronic pain, epididymitis, and swelling/edema. All questions were answered, and the consent form was signed. We'll get this scheduled for the patient at his convenience.   Return for vasectomy.  Ardis Hughs, [facility] [address], [place], [place] [address] [phone]

## 2016-11-29 ENCOUNTER — Encounter: Payer: Self-pay | Admitting: Family Medicine

## 2016-11-29 ENCOUNTER — Ambulatory Visit (INDEPENDENT_AMBULATORY_CARE_PROVIDER_SITE_OTHER): Payer: Commercial Managed Care - PPO | Admitting: Family Medicine

## 2016-11-29 VITALS — BP 130/88 | HR 120 | Temp 98.6°F | Wt 254.0 lb

## 2016-11-29 DIAGNOSIS — L0231 Cutaneous abscess of buttock: Secondary | ICD-10-CM | POA: Diagnosis not present

## 2016-11-29 MED ORDER — CLOBETASOL PROPIONATE 0.05 % EX OINT: 1.0000 "application " | TOPICAL_OINTMENT | Freq: Two times a day (BID) | CUTANEOUS | 0 refills | Status: DC

## 2016-11-29 MED ORDER — DOXYCYCLINE HYCLATE 100 MG PO TABS: 100.0000 mg | ORAL_TABLET | Freq: Two times a day (BID) | ORAL | 0 refills | Status: DC

## 2016-11-29 NOTE — Progress Notes (Signed)
   BP 130/88   Pulse (!) 120   Temp 98.6 F (37 C)   Wt 254 lb (115.2 kg)   SpO2 94%   BMI 38.62 kg/m    Subjective:    Patient ID: Casey Cole, male    DOB: 31-Dec-1975, 41 y.o.   MRN: [id number]  HPI: Casey Cole is a 41 y.o. male  Chief Complaint  Patient presents with  . Abcess    x 1 week. on buttocks in same location where he had it happen last year, had to have surgery   Patient with hx of gluteal abscess s/p surgical excision 08/2015 experiencing 1 week of recurrent pain, inflammation, and drainage from the area. Has expressed quite a bit of pus. Denies fever, chills, N/V. Not taking anything for pain control at this time.    Relevant past medical, surgical, family and social history reviewed and updated as indicated. Interim medical history since our last visit reviewed. Allergies and medications reviewed and updated.  Review of Systems  Constitutional: Negative.   Respiratory: Negative.   Cardiovascular: Negative.   Gastrointestinal: Negative.   Musculoskeletal: Negative.   Skin: Positive for wound.  Neurological: Negative.   Psychiatric/Behavioral: Negative.    Per HPI unless specifically indicated above     Objective:    BP 130/88   Pulse (!) 120   Temp 98.6 F (37 C)   Wt 254 lb (115.2 kg)   SpO2 94%   BMI 38.62 kg/m   Wt Readings from Last 3 Encounters:  11/29/16 254 lb (115.2 kg)  11/18/16 255 lb (115.7 kg)  10/28/16 256 lb 8 oz (116.3 kg)    Physical Exam  Constitutional: He appears well-developed and well-nourished. No distress.  HENT:  Head: Atraumatic.  Eyes: Pupils are equal, round, and reactive to light. Conjunctivae are normal.  Neck: Normal range of motion. Neck supple.  Cardiovascular: Normal rate and normal heart sounds.   Pulmonary/Chest: Effort normal and breath sounds normal. No respiratory distress.  Skin: Skin is warm and dry.  Left gluteal fold with firm abscess present. Erythematous, no drainage currently.    Psychiatric:  He has a normal mood and affect. His behavior is normal.  Nursing note and vitals reviewed.     Assessment & Plan:   Problem List Items Addressed This Visit    None    Visit Diagnoses    Abscess, gluteal, left    -  Primary   Will start doxycycline and recheck next week. Warm compresses, sitz baths, ibuprofen or tylenol for pain control. F/u if worsening in meantime.        Follow up plan: Return in about 1 week (around 12/06/2016).

## 2016-11-29 NOTE — Patient Instructions (Signed)
Follow-up next week.

## 2016-12-06 ENCOUNTER — Encounter: Payer: Self-pay | Admitting: Family Medicine

## 2016-12-06 ENCOUNTER — Ambulatory Visit (INDEPENDENT_AMBULATORY_CARE_PROVIDER_SITE_OTHER): Payer: Commercial Managed Care - PPO | Admitting: Family Medicine

## 2016-12-06 VITALS — BP 112/77 | HR 104 | Temp 98.3°F | Wt 258.0 lb

## 2016-12-06 DIAGNOSIS — L0231 Cutaneous abscess of buttock: Secondary | ICD-10-CM | POA: Diagnosis not present

## 2016-12-06 MED ORDER — DOXYCYCLINE HYCLATE 100 MG PO TABS: 100.0000 mg | ORAL_TABLET | Freq: Two times a day (BID) | ORAL | 0 refills | Status: DC

## 2016-12-06 NOTE — Progress Notes (Signed)
BP 112/77 (BP Location: Left Arm, Patient Position: Sitting, Cuff Size: Large)   Pulse (!) 104   Temp 98.3 F (36.8 C)   Wt 258 lb (117 kg)   SpO2 96%   BMI 39.23 kg/m    Subjective:    Patient ID: Casey Cole, male    DOB: [birth year], 41 y.o.   MRN: [id number]  HPI: Casey Cole is a 41 y.o. male  Chief Complaint  Patient presents with  . Abscess   ABSCESS- feeling a lot better Duration: 1.5 week Location: L buttock Pain:  no Quality:  pulling Severity: mild Redness:  no Swelling:  no Warmth:  no Oozing:  no Pus:  no Treatments attempted:antibiotics, I&D Past similar infections:  yes Past MRSA skin infections:  no History of trauma in area:  no Fevers:  no Nausea/vomiting:  no   Relevant past medical, surgical, family and social history reviewed and updated as indicated. Interim medical history since our last visit reviewed. Allergies and medications reviewed and updated.  Review of Systems  Constitutional: Negative.   Respiratory: Negative.   Cardiovascular: Negative.   Skin: Negative for color change, pallor, rash and wound.  Psychiatric/Behavioral: Negative.     Per HPI unless specifically indicated above     Objective:    BP 112/77 (BP Location: Left Arm, Patient Position: Sitting, Cuff Size: Large)   Pulse (!) 104   Temp 98.3 F (36.8 C)   Wt 258 lb (117 kg)   SpO2 96%   BMI 39.23 kg/m   Wt Readings from Last 3 Encounters:  12/06/16 258 lb (117 kg)  11/29/16 254 lb (115.2 kg)  11/18/16 255 lb (115.7 kg)    Physical Exam  Constitutional: He is oriented to person, place, and time. He appears well-developed and well-nourished. No distress.  HENT:  Head: Normocephalic and atraumatic.  Right Ear: Hearing normal.  Left Ear: Hearing normal.  Nose: Nose normal.  Eyes: Conjunctivae and lids are normal. Right eye exhibits no discharge. Left eye exhibits no discharge. No scleral icterus.  Pulmonary/Chest: Effort normal. No respiratory  distress.  Musculoskeletal: Normal range of motion.  Neurological: He is alert and oriented to person, place, and time.  Skin: Skin is warm, dry and intact. No rash noted. No erythema. No pallor.  2 cm erythematous bump in the perineal are of L buttock- no fluctuance. No tenderness. No pus  Psychiatric: He has a normal mood and affect. His speech is normal and behavior is normal. Judgment and thought content normal. Cognition and memory are normal.  Nursing note and vitals reviewed.   Results for orders placed or performed in visit on 10/28/16  Bayer DCA Hb A1c Waived  Result Value Ref Range   Bayer DCA Hb A1c Waived 7.9 (H) <7.0 %  CBC with Differential/Platelet  Result Value Ref Range   WBC 9.7 3.4 - 10.8 x10E3/uL   RBC 5.42 4.14 - 5.80 x10E6/uL   Hemoglobin 17.1 13.0 - 17.7 g/dL   Hematocrit 50.0 37.5 - 51.0 %   MCV 92 79 - 97 fL   MCH 31.5 26.6 - 33.0 pg   MCHC 34.2 31.5 - 35.7 g/dL   RDW 13.9 12.3 - 15.4 %   Platelets 175 150 - 379 x10E3/uL   Neutrophils 65 Not Estab. %   Lymphs 32 Not Estab. %   Monocytes 1 Not Estab. %   Eos 2 Not Estab. %   Basos 0 Not Estab. %   Neutrophils Absolute 6.4 1.4 -  7.0 x10E3/uL   Lymphocytes Absolute 3.1 0.7 - 3.1 x10E3/uL   Monocytes Absolute 0.1 0.1 - 0.9 x10E3/uL   EOS (ABSOLUTE) 0.2 0.0 - 0.4 x10E3/uL   Basophils Absolute 0.0 0.0 - 0.2 x10E3/uL   Immature Granulocytes 0 Not Estab. %   Immature Grans (Abs) 0.0 0.0 - 0.1 x10E3/uL  Comprehensive metabolic panel  Result Value Ref Range   Glucose 108 (H) 65 - 99 mg/dL   BUN 8 6 - 24 mg/dL   Creatinine, Ser 1.01 0.76 - 1.27 mg/dL   GFR calc non Af Amer 93 >59 mL/min/1.73   GFR calc Af Amer 107 >59 mL/min/1.73   BUN/Creatinine Ratio 8 (L) 9 - 20   Sodium 138 134 - 144 mmol/L   Potassium 3.9 3.5 - 5.2 mmol/L   Chloride 99 96 - 106 mmol/L   CO2 24 20 - 29 mmol/L   Calcium 9.3 8.7 - 10.2 mg/dL   Total Protein 7.0 6.0 - 8.5 g/dL   Albumin 4.5 3.5 - 5.5 g/dL   Globulin, Total 2.5 1.5 -  4.5 g/dL   Albumin/Globulin Ratio 1.8 1.2 - 2.2   Bilirubin Total 1.3 (H) 0.0 - 1.2 mg/dL   Alkaline Phosphatase 80 39 - 117 IU/L   AST 21 0 - 40 IU/L   ALT 35 0 - 44 IU/L  Lipid Panel w/o Chol/HDL Ratio  Result Value Ref Range   Cholesterol, Total 199 100 - 199 mg/dL   Triglycerides 303 (H) 0 - 149 mg/dL   HDL 34 (L) >39 mg/dL   VLDL Cholesterol Cal 61 (H) 5 - 40 mg/dL   LDL Calculated 104 (H) 0 - 99 mg/dL  TSH  Result Value Ref Range   TSH 0.685 0.450 - 4.500 uIU/mL  UA/M w/rflx Culture, Routine  Result Value Ref Range   Specific Gravity, UA CANCELED    pH, UA CANCELED    Protein, UA CANCELED    Glucose, UA CANCELED    Ketones, UA CANCELED       Assessment & Plan:   Problem List Items Addressed This Visit    None    Visit Diagnoses    Abscess, gluteal, left    -  Primary   Will extend his doxycyline to 2 weeks. Much better. Call with any concerns or if coming back and will get him into Dr. Bary Castilla. No need right now.        Follow up plan: Return As scheduled.

## 2016-12-26 ENCOUNTER — Ambulatory Visit (INDEPENDENT_AMBULATORY_CARE_PROVIDER_SITE_OTHER): Payer: Commercial Managed Care - PPO | Admitting: Urology

## 2016-12-26 ENCOUNTER — Encounter: Payer: Self-pay | Admitting: Urology

## 2016-12-26 VITALS — BP 103/67 | HR 103 | Ht 68.0 in | Wt 252.4 lb

## 2016-12-26 DIAGNOSIS — Z3009 Encounter for other general counseling and advice on contraception: Secondary | ICD-10-CM | POA: Diagnosis not present

## 2016-12-26 MED ORDER — OXYCODONE-ACETAMINOPHEN 5-325 MG PO TABS: 1.0000 | ORAL_TABLET | ORAL | 0 refills | Status: DC | PRN

## 2016-12-26 NOTE — Progress Notes (Signed)
Bilateral Vasectomy Procedure  Pre-Procedure: - Patient's scrotum was prepped and draped for vasectomy. - The vas was palpated through the scrotal skin on the left. - 1% Xylocaine was injected into the skin and surrounding tissue for placement  - In a similar manner, the vas on the right was identified, anesthetized, and stabilized.  Procedure: - A scalpel was used to make a 1 cm incision in his left hemiscrotum - The left vas was isolated and brought up through the incision exposing that structure. - Bleeding points were cauterized as they occurred. - The vas was free from the surrounding structures and brought into view. - A segment was positioned for placement with a hemostat. - A second hemostat was placed and a small segment between the two hemostats and was removed for inspection. - Each end of the transected vas lumen was fulgurated/obliterated using needlepoint electrocautery. It was then tied off with a #2-0 silk suture -The same procedure was performed on the right. - A suture of #3-0 chromic catgut was used to close each lateral scrotal skin incision  Post-Procedure: - Patient was instructed in care of the operative area - A specimen is to be delivered in 12 and 16 weeks   -Another form of contraception is to be used until he is cleared by the office.

## 2017-01-10 ENCOUNTER — Emergency Department: Payer: Commercial Managed Care - PPO

## 2017-01-10 ENCOUNTER — Encounter: Payer: Self-pay | Admitting: Emergency Medicine

## 2017-01-10 ENCOUNTER — Emergency Department
Admission: EM | Admit: 2017-01-10 | Discharge: 2017-01-10 | Disposition: A | Discharge status: Home / Self Care | Payer: Commercial Managed Care - PPO | Attending: Emergency Medicine | Admitting: Emergency Medicine

## 2017-01-10 DIAGNOSIS — X58XXXA Exposure to other specified factors, initial encounter: Secondary | ICD-10-CM | POA: Insufficient documentation

## 2017-01-10 DIAGNOSIS — I10 Essential (primary) hypertension: Secondary | ICD-10-CM | POA: Insufficient documentation

## 2017-01-10 DIAGNOSIS — Y998 Other external cause status: Secondary | ICD-10-CM | POA: Diagnosis not present

## 2017-01-10 DIAGNOSIS — Z7984 Long term (current) use of oral hypoglycemic drugs: Secondary | ICD-10-CM | POA: Insufficient documentation

## 2017-01-10 DIAGNOSIS — F17210 Nicotine dependence, cigarettes, uncomplicated: Secondary | ICD-10-CM | POA: Diagnosis not present

## 2017-01-10 DIAGNOSIS — Y939 Activity, unspecified: Secondary | ICD-10-CM | POA: Diagnosis not present

## 2017-01-10 DIAGNOSIS — N5082 Scrotal pain: Secondary | ICD-10-CM | POA: Insufficient documentation

## 2017-01-10 DIAGNOSIS — Y929 Unspecified place or not applicable: Secondary | ICD-10-CM | POA: Diagnosis not present

## 2017-01-10 DIAGNOSIS — N5089 Other specified disorders of the male genital organs: Secondary | ICD-10-CM | POA: Diagnosis present

## 2017-01-10 DIAGNOSIS — N433 Hydrocele, unspecified: Secondary | ICD-10-CM | POA: Diagnosis not present

## 2017-01-10 DIAGNOSIS — E119 Type 2 diabetes mellitus without complications: Secondary | ICD-10-CM | POA: Diagnosis not present

## 2017-01-10 DIAGNOSIS — S3022XA Contusion of scrotum and testes, initial encounter: Secondary | ICD-10-CM | POA: Diagnosis not present

## 2017-01-10 DIAGNOSIS — Z79899 Other long term (current) drug therapy: Secondary | ICD-10-CM | POA: Diagnosis not present

## 2017-01-10 DIAGNOSIS — N50819 Testicular pain, unspecified: Secondary | ICD-10-CM

## 2017-01-10 MED ORDER — OXYCODONE-ACETAMINOPHEN 5-325 MG PO TABS
2.0000 | ORAL_TABLET | Freq: Once | ORAL | Status: AC
  Administered 2017-01-10: 2 via ORAL
  Filled 2017-01-10: qty 2

## 2017-01-10 MED ORDER — DOCUSATE SODIUM 100 MG PO CAPS: ORAL_CAPSULE | ORAL | 0 refills | Status: DC

## 2017-01-10 MED ORDER — HYDROCODONE-ACETAMINOPHEN 5-325 MG PO TABS: 1.0000 | ORAL_TABLET | Freq: Four times a day (QID) | ORAL | 0 refills | Status: DC | PRN

## 2017-01-10 MED ORDER — CEPHALEXIN 500 MG PO CAPS: 500.0000 mg | ORAL_CAPSULE | Freq: Two times a day (BID) | ORAL | 0 refills | Status: DC

## 2017-01-10 NOTE — Consult Note (Signed)
01/10/2017 1:21 PM   Casey Cole 09/07/1975 [id number]  Referring provider: Dr. Gwen Pounds  Chief Complaint  Patient presents with  . Groin Swelling    HPI: The patient is a 41 year old gentleman who is 2 weeks status post vasectomy presents to the ED with sudden onset of right groin pain and swelling. He notes that he was getting up from his cardiac felt that he knocked his groin yesterday. Over the course of the remainder of the day he noted that his right scrotum began to swell and become painful. Prior to this, he was doing well recovering from surgery. His pain was resolving. No drainage from incisions. No fevers or chills. Over the last 12 hours, his only complaint is the right scrotal swelling and pain. He continues to have no drainage. No fevers or chills. Scrotal ultrasound was performed which did show a 3.5 cm scrotal hematoma.     PMH: Past Medical History:  Diagnosis Date  . Diabetes mellitus without complication ([place])   . Hyperlipidemia   . Hypertension   . Sleep apnea     Surgical History: Past Surgical History:  Procedure Laterality Date  . CHOLECYSTECTOMY    . INCISE AND DRAIN ABCESS Left 07-12-15   left buttock  . INCISION AND DRAINAGE PERIRECTAL ABSCESS N/A 08/23/2015   Procedure: IRRIGATION AND DEBRIDEMENT PERIRECTAL ABSCESS;  Surgeon: Robert Bellow, MD;  Location: [facility] ORS;  Service: General;  Laterality: N/A;  Anal Scope  . VASECTOMY      Allergies: No Known Allergies  Family History: Family History  Problem Relation Age of Onset  . Hypertension Father   . Stroke Paternal Grandfather   . Heart attack Paternal Grandfather     Social History:  reports that he has been smoking Cigarettes.  He has been smoking about 1.00 pack per day. He has never used smokeless tobacco. He reports that he drinks about 1.8 oz of alcohol per week . He reports that he does not use drugs.  ROS: 12 point ROS negative except for above  Physical Exam: BP 119/85    Pulse (!) 101   Temp 97.8 F (36.6 C) (Oral)   Resp 18   SpO2 95%   Constitutional:  Alert and oriented, No acute distress. HEENT: [place] AT, moist mucus membranes.  Trachea midline, no masses. Cardiovascular: No clubbing, cyanosis, or edema. Respiratory: Normal respiratory effort, no increased work of breathing. GI: Abdomen is soft, nontender, nondistended, no abdominal masses GU: No CVA tenderness. Normal phallus. Bilateral scrotal incisions healing without drainage, though the right incision has a small amount of fibrinous exudate. There is no purulent discharge from either incision. Left testicle is normal with some inflammatory changes within the cord consistent with recent vasectomy. There is a palpable low hematoma consistent with imaging in the right hemiscrotum. It is mildly tender to palpation. There is no fluctuance. There is no crepitus. There is no skin necrosis. There is very smiled skin erythema. There is again no drainage from the vasectomy incision. Skin: No rashes, bruises or suspicious lesions. Lymph: No cervical or inguinal adenopathy. Neurologic: Grossly intact, no focal deficits, moving all 4 extremities. Psychiatric: Normal mood and affect.  Laboratory Data: Lab Results  Component Value Date   WBC 9.7 10/28/2016   HGB 17.1 10/28/2016   HCT 50.0 10/28/2016   MCV 92 10/28/2016   PLT 175 10/28/2016    Lab Results  Component Value Date   CREATININE 1.01 10/28/2016    No results found for: PSA  No results found for: TESTOSTERONE  Lab Results  Component Value Date   HGBA1C 7.8 01/19/2016    Urinalysis    Component Value Date/Time   APPEARANCEUR Clear 10/27/2015 1023   GLUCOSEU CANCELED 10/28/2016 1517   BILIRUBINUR Negative 10/27/2015 1023   PROTEINUR CANCELED 10/28/2016 1517   NITRITE Negative 10/27/2015 1023   LEUKOCYTESUR Negative 10/27/2015 1023    Pertinent Imaging: Scrotal u/s reviewed as above  Assessment & Plan:    1. Scrotal hematoma s/p  vasectomy I discussed with the patient that this is likely delayed bleed after undergoing a vasectomy. Due to sudden onset and imaging findings, I do not think this is an infectious process. That being said, he did have very mild erythema in the right hemiscrotum, so I think it would be prudent to place the patient week of Keflex as a precautionary measure. I have also advised the patient to wear scrotal support and ice the area at least once per day. He was informed that this process will take many weeks to completely resolve. He was given strict return precautions to return if he develops fevers greater than 101, his pain becomes uncontrollable, or his incisions start draining purulent fluid. At this point, he is safe for discharge home from a urological standpoint.   Nickie Retort, MD  [facility] [address] [place], [place] [address] [phone]

## 2017-01-10 NOTE — ED Triage Notes (Signed)
Pt post vasectomy on the 24th of August and started having some swelling and pain to his right testicle yesterday. Denies any issues with passing urine.

## 2017-01-10 NOTE — ED Notes (Signed)
Patient ate breakfast at 0600

## 2017-01-10 NOTE — Discharge Instructions (Signed)
Please remember to wear tight fitting underwear and use cold packs as recommended by Dr. Pilar Jarvis.  Take the full course of prescribed antibiotics.    Return to the emergency department if you develop new or worsening symptoms that concern you.

## 2017-01-10 NOTE — ED Provider Notes (Signed)
[facility] Emergency Department Provider Note  ____________________________________________   First MD Initiated Contact with Patient 01/10/17 [phone]     (approximate)  I have reviewed the triage vital signs and the nursing notes.   HISTORY  Chief Complaint Groin Swelling    HPI Casey Cole is a 41 y.o. male with medical history as listed below who presents for evaluation of pain and swelling in his scrotum.  He had a vasectomy by Dr. Pilar Jarvis about 2 weeks ago.  He seemed to be healing well but then yesterday as he was getting out of his car he felt acute onset of moderate pain on the right side of his scrotum.  Since then he has gradually had more and more swelling and associated pain.  He does not appreciate any redness in the area.  The pain radiates into his right groin.  There are no open wounds and the surgical sites (one on each side of the scrotum) are well-appearing.he describes the pain currently as severe and walking and bearing weight make it worse.  He is not having any difficulty with urination.  He denies fever/chills, chest pain, shortness of breath, nausea, vomiting, abdominal pain.  He states that he tried calling the urology clinic but was told by the person answered the phone to discuss to the emergency department.   Past Medical History:  Diagnosis Date  . Diabetes mellitus without complication ([place])   . Hyperlipidemia   . Hypertension   . Sleep apnea     Patient Active Problem List   Diagnosis Date Noted  . OSA (obstructive sleep apnea) 10/27/2015  . Depression 10/27/2015  . Hyperlipidemia   . Benign hypertensive renal disease   . Diabetes mellitus without complication ([place])   . Abscess of superficial perineal space 08/23/2015    Past Surgical History:  Procedure Laterality Date  . CHOLECYSTECTOMY    . INCISE AND DRAIN ABCESS Left 07-12-15   left buttock  . INCISION AND DRAINAGE PERIRECTAL ABSCESS N/A 08/23/2015   Procedure:  IRRIGATION AND DEBRIDEMENT PERIRECTAL ABSCESS;  Surgeon: Robert Bellow, MD;  Location: [facility] ORS;  Service: General;  Laterality: N/A;  Anal Scope  . VASECTOMY      Prior to Admission medications   Medication Sig Start Date End Date Taking? Authorizing Provider  clobetasol ointment (TEMOVATE) 9.38 % Apply 1 application topically 2 (two) times daily. 11/29/16  Yes Volney American, PA-C  Dapagliflozin-Metformin HCl ER (XIGDUO XR) 09-998 MG TB24 Take 1 tablet by mouth 2 (two) times daily. 10/28/16  Yes Johnson, Megan P, DO  oxyCODONE-acetaminophen (ROXICET) 5-325 MG tablet Take 1 tablet by mouth every 4 (four) hours as needed for severe pain. 12/26/16  Yes Nickie Retort, MD  TALTZ 80 MG/ML SOAJ Take 80 mg by mouth every 30 (thirty) days.  01/12/16  Yes [provider]  cephALEXin (KEFLEX) 500 MG capsule Take 1 capsule (500 mg total) by mouth 2 (two) times daily. 01/10/17   Hinda Kehr, MD  docusate sodium (COLACE) 100 MG capsule Take 1 tablet once or twice daily as needed for constipation while taking narcotic pain medicine 01/10/17   Hinda Kehr, MD  doxycycline (VIBRA-TABS) 100 MG tablet Take 1 tablet (100 mg total) by mouth 2 (two) times daily. Patient not taking: Reported on 01/10/2017 12/06/16   Park Liter P, DO  HYDROcodone-acetaminophen (NORCO/VICODIN) 5-325 MG tablet Take 1-2 tablets by mouth every 6 (six) hours as needed for moderate pain. 01/10/17   Hinda Kehr, MD  ONE TOUCH ULTRA TEST test strip USE AS DIRECTED ONCE DAILY 09/13/16   Park Liter P, DO    Allergies Patient has no known allergies.  Family History  Problem Relation Age of Onset  . Hypertension Father   . Stroke Paternal Grandfather   . Heart attack Paternal Grandfather     Social History Social History  Substance Use Topics  . Smoking status: Current Every Day Smoker    Packs/day: 1.00    Types: Cigarettes  . Smokeless tobacco: Never Used  . Alcohol use 1.8 oz/week    3 Shots of liquor  per week    Review of Systems Constitutional: No fever/chills Eyes: No visual changes. ENT: No sore throat. Cardiovascular: Denies chest pain. Respiratory: Denies shortness of breath. Gastrointestinal: No abdominal pain.  No nausea, no vomiting.  No diarrhea.  No constipation. Genitourinary: acute onset of pain in the right side of his scrotum yesterday with gradually worsening and now severe pain and swelling. Negative for dysuria. Musculoskeletal: Negative for neck pain.  Negative for back pain. Integumentary: Negative for rash. Neurological: Negative for headaches, focal weakness or numbness.   ____________________________________________   PHYSICAL EXAM:  VITAL SIGNS: ED Triage Vitals  Enc Vitals Group     BP 01/10/17 0757 (!) 153/103     Pulse Rate 01/10/17 0757 (!) 113     Resp 01/10/17 0757 18     Temp 01/10/17 0757 97.8 F (36.6 C)     Temp Source 01/10/17 0757 Oral     SpO2 01/10/17 0757 94 %     Weight --      Height --      Head Circumference --      Peak Flow --      Pain Score 01/10/17 0804 5     Pain Loc --      Pain Edu? --      Excl. in [place]? --     Constitutional: Alert and oriented. generally well-appearing but does appear uncomfortable Eyes: Conjunctivae are normal.  Head: Atraumatic. Cardiovascular: borderline tachycardia, regular rhythm. Good peripheral circulation. Grossly normal heart sounds. Respiratory: Normal respiratory effort.  No retractions. Lungs CTAB. Gastrointestinal: Soft and nontender. No distention.  Genitourinary: significant swelling is immediately evident to primarily the right side of his scrotum with some firmness and tenderness to the testis and epididymal area.  Patient is tender enough it is difficult to localize any specific pain or discomfort.  He has some mild tenderness in the inguinal region as well it is minimal.  There is no fluctuance or induration and the surgical wounds are well appearing.  There is no crepitus and  there is no perineal involvement that would suggest for Fournier's gangrene Musculoskeletal: No lower extremity tenderness nor edema. No gross deformities of extremities. Neurologic:  Normal speech and language. No gross focal neurologic deficits are appreciated.  Skin:  Skin is warm, dry and intact. No rash noted. Psychiatric: Mood and affect are normal. Speech and behavior are normal.  ____________________________________________   LABS (all labs ordered are listed, but only abnormal results are displayed)  Labs Reviewed - No data to display ____________________________________________  EKG  None - EKG not ordered by ED physician ____________________________________________  RADIOLOGY   US Scrotum  Result Date: 01/10/2017 CLINICAL DATA:  Swelling/pain on the right, status post vasectomy on 12/27/2016 EXAM: SCROTAL ULTRASOUND DOPPLER ULTRASOUND OF THE TESTICLES TECHNIQUE: Complete ultrasound examination of the testicles, epididymis, and other scrotal structures was performed. Color and spectral Doppler ultrasound were  also utilized to evaluate blood flow to the testicles. COMPARISON:  None. FINDINGS: Right testicle Measurements: 5.2 x 2.8 x 3.8 cm. No mass or microlithiasis visualized. Left testicle Measurements: 5.4 x 2.6 x 3.7 cm. No mass or microlithiasis visualized. Right epididymis:  Normal in size and appearance. Left epididymis:  9 x 5 x 6 mm epididymal head cyst. Hydrocele:  Small bilateral hydroceles. Varicocele:  None visualized. Pulsed Doppler interrogation of both testes demonstrates normal low resistance arterial and venous waveforms bilaterally. Additional comments: 3.4 x 1.1 x 0.8 cm complex fluid collection along the right lateral scrotal wall, favoring a resolving hematoma. Superimposed infection is difficult to exclude by imaging; adjacent hyperemia is present, but peripheral vascularity typical of an abscess is not visualized. Seroma and lymphocele are considered less  likely. IMPRESSION: Normal sonographic appearance of the bilateral testes. No evidence of testicular torsion. Small bilateral hydroceles. 3.4 x 1.1 x 0.8 cm complex fluid collection along the right lateral scrotal wall, likely postsurgical, favoring resolving hematoma. Superimposed infection is difficult to exclude by imaging. Electronically Signed   By: Julian Hy M.D.   On: 01/10/2017 10:27   US Pelvic Doppler (torsion R/o Or Mass Arterial Flow)  Result Date: 01/10/2017 CLINICAL DATA:  Swelling/pain on the right, status post vasectomy on 12/27/2016 EXAM: SCROTAL ULTRASOUND DOPPLER ULTRASOUND OF THE TESTICLES TECHNIQUE: Complete ultrasound examination of the testicles, epididymis, and other scrotal structures was performed. Color and spectral Doppler ultrasound were also utilized to evaluate blood flow to the testicles. COMPARISON:  None. FINDINGS: Right testicle Measurements: 5.2 x 2.8 x 3.8 cm. No mass or microlithiasis visualized. Left testicle Measurements: 5.4 x 2.6 x 3.7 cm. No mass or microlithiasis visualized. Right epididymis:  Normal in size and appearance. Left epididymis:  9 x 5 x 6 mm epididymal head cyst. Hydrocele:  Small bilateral hydroceles. Varicocele:  None visualized. Pulsed Doppler interrogation of both testes demonstrates normal low resistance arterial and venous waveforms bilaterally. Additional comments: 3.4 x 1.1 x 0.8 cm complex fluid collection along the right lateral scrotal wall, favoring a resolving hematoma. Superimposed infection is difficult to exclude by imaging; adjacent hyperemia is present, but peripheral vascularity typical of an abscess is not visualized. Seroma and lymphocele are considered less likely. IMPRESSION: Normal sonographic appearance of the bilateral testes. No evidence of testicular torsion. Small bilateral hydroceles. 3.4 x 1.1 x 0.8 cm complex fluid collection along the right lateral scrotal wall, likely postsurgical, favoring resolving hematoma.  Superimposed infection is difficult to exclude by imaging. Electronically Signed   By: Julian Hy M.D.   On: 01/10/2017 10:27    ____________________________________________   PROCEDURES  Critical Care performed: No   Procedure(s) performed:   Procedures   ____________________________________________   INITIAL IMPRESSION / ASSESSMENT AND PLAN / ED COURSE  Pertinent labs & imaging results that were available during my care of the patient were reviewed by me and considered in my medical decision making (see chart for details).  infectious processes also be considered given his recent surgery, but the fact that the pain started suddenly as he was getting out of a car yesterday and has been gradually swelling since then is more suggestive to me of perhaps some postoperative bleeding rather than acute onset of abscess/infection.  Additionally he is systemically well and I suspect that his borderline tachycardia is more due to pain and ambulating into triage than anything systemic.  His physical exam is reassuring with no clinical signs or symptoms of Fournier's gangrene. I will evaluate with  an ultrasound and then contact Dr. Pilar Jarvis who is on call for urology today.  I am giving the patient 2 Percocet in the meantime.   Clinical Course as of Jan 11 1316  Fri Jan 10, 2017  1132 I spoke by phone with Dr. Pilar Jarvis regarding the patient and the ultrasound findings.  He agrees that infection/abscess is very likely given the history.  He has one more surgery to perform and then he will come to the emergency department to evaluate the patient.  I updated the patient and ask him to remain nothing by mouth on the off chance that he needs to have a procedure.  [CF]  1314 Dr. Pilar Jarvis evaluated the patient and discussed the case with me in person.  He feels that this is a hematoma and does not require emergent intervention.  He did recommend a week of Keflex just to be certain there is no infection.   He gave the patient recommendations such a scrotal support and ice packs.  I verified all this with the patient and will prepare his discharge papers accordingly.  The patient agrees with the plan.  [CF]    Clinical Course User Index [CF] Hinda Kehr, MD    ____________________________________________  FINAL CLINICAL IMPRESSION(S) / ED DIAGNOSES  Final diagnoses:  Scrotal hematoma  Scrotal swelling  Scrotal pain     MEDICATIONS GIVEN DURING THIS VISIT:  Medications  oxyCODONE-acetaminophen (PERCOCET/ROXICET) 5-325 MG per tablet 2 tablet (2 tablets Oral Given 01/10/17 0851)     NEW OUTPATIENT MEDICATIONS STARTED DURING THIS VISIT:  New Prescriptions   CEPHALEXIN (KEFLEX) 500 MG CAPSULE    Take 1 capsule (500 mg total) by mouth 2 (two) times daily.   DOCUSATE SODIUM (COLACE) 100 MG CAPSULE    Take 1 tablet once or twice daily as needed for constipation while taking narcotic pain medicine   HYDROCODONE-ACETAMINOPHEN (NORCO/VICODIN) 5-325 MG TABLET    Take 1-2 tablets by mouth every 6 (six) hours as needed for moderate pain.    Modified Medications   No medications on file    Discontinued Medications   DIAZEPAM (VALIUM) 10 MG TABLET         Note:  This document was prepared using Dragon voice recognition software and may include unintentional dictation errors.    Hinda Kehr, MD 01/10/17 [phone]

## 2017-01-17 ENCOUNTER — Encounter: Payer: Self-pay | Admitting: Urology

## 2017-01-17 ENCOUNTER — Ambulatory Visit (INDEPENDENT_AMBULATORY_CARE_PROVIDER_SITE_OTHER): Payer: Commercial Managed Care - PPO | Admitting: Urology

## 2017-01-17 VITALS — BP 115/74 | HR 111 | Ht 68.0 in | Wt 254.7 lb

## 2017-01-17 DIAGNOSIS — Z9852 Vasectomy status: Secondary | ICD-10-CM

## 2017-01-17 LAB — URINALYSIS, COMPLETE
Bilirubin, UA: NEGATIVE
KETONES UA: NEGATIVE
LEUKOCYTES UA: NEGATIVE
Nitrite, UA: NEGATIVE
RBC, UA: NEGATIVE
Specific Gravity, UA: >1.03 — ABNORMAL HIGH (ref 1.005–1.030)
Urobilinogen, Ur: 0.2 mg/dL (ref 0.2–1.0)
pH, UA: 5.5 (ref 5.0–7.5)

## 2017-01-17 MED ORDER — CEPHALEXIN 500 MG PO CAPS: 500.0000 mg | ORAL_CAPSULE | Freq: Two times a day (BID) | ORAL | 0 refills | Status: DC

## 2017-01-17 NOTE — Progress Notes (Signed)
01/17/2017 9:22 AM   Casey Cole 1975-05-13 [id number]  Referring provider: Valerie Roys, DO [place], [place] [address]  Chief Complaint  Patient presents with  . Follow-up    HPI: The patient is a 41 year old gentleman who is 3 weeks status post vasectomy who presents today for follow-up after being seen in the ED one week ago for sudden onset right groin pain and swelling for approximately one week. One day prior to presenting to the ED, he was getting out of his car when he knocked his groin. The swelling began shortly after this. Scrotal ultrasound was performed which did show a 3.5 cm scrotal hematoma. . No sign of infection outside of some otherwise erythematous skin that time. Out of abundance of caution, he was started on Keflex twice a day. He noted the day after being seen in the emergency room that it started to drain purulent discharge. It is now a clear discharge. His swelling is remarkably improved. His pain is significantly improved. He denies fevers or chills. He feels much better now than he did 1 week ago. The drainage had slightly decreased since he saw that last weekend.   PMH: Past Medical History:  Diagnosis Date  . Diabetes mellitus without complication ([place])   . Hyperlipidemia   . Hypertension   . Sleep apnea     Surgical History: Past Surgical History:  Procedure Laterality Date  . CHOLECYSTECTOMY    . INCISE AND DRAIN ABCESS Left 07-12-15   left buttock  . INCISION AND DRAINAGE PERIRECTAL ABSCESS N/A 08/23/2015   Procedure: IRRIGATION AND DEBRIDEMENT PERIRECTAL ABSCESS;  Surgeon: Robert Bellow, MD;  Location: [facility] ORS;  Service: General;  Laterality: N/A;  Anal Scope  . VASECTOMY      Home Medications:  Allergies as of 01/17/2017   No Known Allergies     Medication List       Accurate as of 01/17/17  9:22 AM. Always use your most recent med list.          cephALEXin 500 MG capsule Commonly known as:  KEFLEX Take 1 capsule (500  mg total) by mouth 2 (two) times daily.   clobetasol ointment 0.05 % Commonly known as:  TEMOVATE Apply 1 application topically 2 (two) times daily.   Dapagliflozin-Metformin HCl ER 09-998 MG Tb24 Commonly known as:  XIGDUO XR Take 1 tablet by mouth 2 (two) times daily.   docusate sodium 100 MG capsule Commonly known as:  COLACE Take 1 tablet once or twice daily as needed for constipation while taking narcotic pain medicine   ONE TOUCH ULTRA TEST test strip Generic drug:  glucose blood USE AS DIRECTED ONCE DAILY   TALTZ 80 MG/ML Soaj Generic drug:  Ixekizumab Take 80 mg by mouth every 30 (thirty) days.            Discharge Care Instructions        Start     Ordered   01/17/17 0000  Urinalysis, Complete     01/17/17 0911      Allergies: No Known Allergies  Family History: Family History  Problem Relation Age of Onset  . Hypertension Father   . Stroke Paternal Grandfather   . Heart attack Paternal Grandfather     Social History:  reports that he has been smoking Cigarettes.  He has been smoking about 1.00 pack per day. He has never used smokeless tobacco. He reports that he drinks about 1.8 oz of alcohol per week .  He reports that he does not use drugs.  ROS: UROLOGY Frequent Urination?: No Hard to postpone urination?: No Burning/pain with urination?: No Get up at night to urinate?: No Leakage of urine?: No Urine stream starts and stops?: No Trouble starting stream?: No Do you have to strain to urinate?: No Blood in urine?: No Urinary tract infection?: No Sexually transmitted disease?: No Injury to kidneys or bladder?: No Painful intercourse?: No Weak stream?: No Erection problems?: No Penile pain?: No  Gastrointestinal Nausea?: No Vomiting?: No Indigestion/heartburn?: No Diarrhea?: No Constipation?: No  Constitutional Fever: No Night sweats?: No Weight loss?: No Fatigue?: No  Skin Skin rash/lesions?: No Itching?: No  Eyes Blurred  vision?: No Double vision?: No  Ears/Nose/Throat Sore throat?: No Sinus problems?: No  Hematologic/Lymphatic Swollen glands?: No Easy bruising?: No  Cardiovascular Leg swelling?: No Chest pain?: No  Respiratory Cough?: No Shortness of breath?: No  Endocrine Excessive thirst?: No  Musculoskeletal Back pain?: No Joint pain?: No  Neurological Headaches?: No Dizziness?: No  Psychologic Depression?: No Anxiety?: No  Physical Exam: BP 115/74 (BP Location: Left Arm, Patient Position: Sitting, Cuff Size: Normal)   Pulse (!) 111   Ht 5\' 8"  (1.727 m)   Wt 254 lb 11.2 oz (115.5 kg)   BMI 38.73 kg/m   Constitutional:  Alert and oriented, No acute distress. HEENT: [place] AT, moist mucus membranes.  Trachea midline, no masses. Cardiovascular: No clubbing, cyanosis, or edema. Respiratory: Normal respiratory effort, no increased work of breathing. GI: Abdomen is soft, nontender, nondistended, no abdominal masses GU: No CVA tenderness. Overall exam much improved since patient was seen 1 week ago. He does have a small amount of serous fluid that I was able to express from his right scrotal incision. There is no purulent discharge. There is no fluctuance. There  S are no areas that are concerning for abscess. Mild tenderness to palpation. Significantly less tender than last week. No crepitus or erythema. Skin: No rashes, bruises or suspicious lesions. Lymph: No cervical or inguinal adenopathy. Neurologic: Grossly intact, no focal deficits, moving all 4 extremities. Psychiatric: Normal mood and affect.  Laboratory Data: Lab Results  Component Value Date   WBC 9.7 10/28/2016   HGB 17.1 10/28/2016   HCT 50.0 10/28/2016   MCV 92 10/28/2016   PLT 175 10/28/2016    Lab Results  Component Value Date   CREATININE 1.01 10/28/2016    No results found for: PSA  No results found for: TESTOSTERONE  Lab Results  Component Value Date   HGBA1C 7.8 01/19/2016    Urinalysis      Component Value Date/Time   APPEARANCEUR Clear 10/27/2015 1023   GLUCOSEU CANCELED 10/28/2016 1517   BILIRUBINUR Negative 10/27/2015 1023   PROTEINUR CANCELED 10/28/2016 1517   NITRITE Negative 10/27/2015 1023   LEUKOCYTESUR Negative 10/27/2015 1023    Assessment & Plan:    1. Scrotal hematoma s/p vasectomy Overall, the patient's exam is remarkably improved since he was seen last week. He did however have a small amount of drainage from his right scrotal incision in the interim. There are no areas that needs to be drained at this time. However, I will give him another week of Keflex to ensure resolution of this. He was given strict return precautions. We will check on his wound again in 1 week.   Nickie Retort, MD  [facility] [address] [place], [place] [address] [phone]

## 2017-02-11 ENCOUNTER — Encounter: Payer: Self-pay | Admitting: Family Medicine

## 2017-02-11 ENCOUNTER — Ambulatory Visit (INDEPENDENT_AMBULATORY_CARE_PROVIDER_SITE_OTHER): Payer: Commercial Managed Care - PPO | Admitting: Family Medicine

## 2017-02-11 VITALS — BP 152/100 | HR 99 | Temp 98.5°F | Wt 257.3 lb

## 2017-02-11 DIAGNOSIS — R062 Wheezing: Secondary | ICD-10-CM | POA: Diagnosis not present

## 2017-02-11 DIAGNOSIS — J209 Acute bronchitis, unspecified: Secondary | ICD-10-CM | POA: Diagnosis not present

## 2017-02-11 MED ORDER — AZITHROMYCIN 250 MG PO TABS: ORAL_TABLET | ORAL | 0 refills | Status: DC

## 2017-02-11 MED ORDER — PREDNISONE 10 MG PO TABS: ORAL_TABLET | ORAL | 0 refills | Status: DC

## 2017-02-11 MED ORDER — ALBUTEROL SULFATE (2.5 MG/3ML) 0.083% IN NEBU: 2.5000 mg | INHALATION_SOLUTION | Freq: Once | RESPIRATORY_TRACT | Status: DC

## 2017-02-11 MED ORDER — HYDROCOD POLST-CPM POLST ER 10-8 MG/5ML PO SUER: 5.0000 mL | Freq: Every evening | ORAL | 0 refills | Status: DC | PRN

## 2017-02-11 MED ORDER — BENZONATATE 200 MG PO CAPS: 200.0000 mg | ORAL_CAPSULE | Freq: Two times a day (BID) | ORAL | 0 refills | Status: DC | PRN

## 2017-02-11 NOTE — Progress Notes (Signed)
BP (!) 152/100 (BP Location: Left Arm, Patient Position: Sitting, Cuff Size: Large)   Pulse 99   Temp 98.5 F (36.9 C)   Wt 257 lb 5 oz (116.7 kg)   SpO2 98%   BMI 39.12 kg/m    Subjective:    Patient ID: Casey Cole, male    DOB: 1976/01/21, 41 y.o.   MRN: [id number]  HPI: Casey Cole is a 41 y.o. male  Chief Complaint  Patient presents with  . URI    x 1.5-2 weeks   UPPER RESPIRATORY TRACT INFECTION Duration: 1.5-2 weeks Worst symptom: congestion, coughing Fever: no- chills and sweats Cough: yes Shortness of breath: yes Wheezing: yes Chest pain: yes, with cough Chest tightness: no Chest congestion: yes Nasal congestion: yes Runny nose: yes Post nasal drip: yes Sneezing: yes Sore throat: no Swollen glands: no Sinus pressure: yes Headache: yes Face pain: yes Toothache: no Ear pain: yes left Ear pressure: yes left Eyes red/itching:yes Eye drainage/crusting: yes  Vomiting: no Rash: no Fatigue: yes Sick contacts: yes Strep contacts: no  Context: worse Recurrent sinusitis: no Relief with OTC cold/cough medications: no  Treatments attempted: dayquil and mucinex-DM   Relevant past medical, surgical, family and social history reviewed and updated as indicated. Interim medical history since our last visit reviewed. Allergies and medications reviewed and updated.  Review of Systems  Constitutional: Positive for chills and fatigue. Negative for activity change, appetite change, diaphoresis, fever and unexpected weight change.  HENT: Positive for congestion, ear pain, postnasal drip, rhinorrhea, sinus pain, sinus pressure and sneezing. Negative for dental problem, drooling, ear discharge, facial swelling, hearing loss, mouth sores, nosebleeds, sore throat, tinnitus, trouble swallowing and voice change.   Respiratory: Positive for cough, chest tightness, shortness of breath and wheezing. Negative for apnea, choking and stridor.   Cardiovascular: Negative.     Gastrointestinal: Negative.   Psychiatric/Behavioral: Negative.     Per HPI unless specifically indicated above     Objective:    BP (!) 152/100 (BP Location: Left Arm, Patient Position: Sitting, Cuff Size: Large)   Pulse 99   Temp 98.5 F (36.9 C)   Wt 257 lb 5 oz (116.7 kg)   SpO2 98%   BMI 39.12 kg/m   Wt Readings from Last 3 Encounters:  02/11/17 257 lb 5 oz (116.7 kg)  01/17/17 254 lb 11.2 oz (115.5 kg)  12/26/16 252 lb 6.4 oz (114.5 kg)    Physical Exam  Constitutional: He is oriented to person, place, and time. He appears well-developed and well-nourished. No distress.  HENT:  Head: Normocephalic and atraumatic.  Right Ear: Hearing, tympanic membrane, external ear and ear canal normal.  Left Ear: Hearing, external ear and ear canal normal.  Nose: Mucosal edema and rhinorrhea present. Right sinus exhibits no maxillary sinus tenderness and no frontal sinus tenderness. Left sinus exhibits maxillary sinus tenderness. Left sinus exhibits no frontal sinus tenderness.  Mouth/Throat: Uvula is midline, oropharynx is clear and moist and mucous membranes are normal. No oropharyngeal exudate.  Cerumen impaction L ear  Eyes: Pupils are equal, round, and reactive to light. Conjunctivae, EOM and lids are normal. Right eye exhibits no discharge. Left eye exhibits no discharge. No scleral icterus.  Neck: Normal range of motion. Neck supple. No JVD present. No tracheal deviation present. No thyromegaly present.  Cardiovascular: Normal rate, regular rhythm, normal heart sounds and intact distal pulses.  Exam reveals no gallop and no friction rub.   No murmur heard. Pulmonary/Chest: Effort normal. No stridor.  No respiratory distress. He has wheezes in the right upper field, the right middle field, the right lower field, the left upper field, the left middle field and the left lower field. He has rhonchi in the right upper field, the right middle field, the right lower field, the left upper  field, the left middle field and the left lower field. He has no rales. He exhibits no tenderness.  Musculoskeletal: Normal range of motion.  Lymphadenopathy:    He has cervical adenopathy.  Neurological: He is alert and oriented to person, place, and time.  Skin: Skin is warm, dry and intact. No rash noted. He is not diaphoretic. No erythema. No pallor.  Psychiatric: He has a normal mood and affect. His speech is normal and behavior is normal. Judgment and thought content normal. Cognition and memory are normal.    Results for orders placed or performed in visit on 01/17/17  Urinalysis, Complete  Result Value Ref Range   Specific Gravity, UA >1.030 (H) 1.005 - 1.030   pH, UA 5.5 5.0 - 7.5   Color, UA Yellow Yellow   Appearance Ur Clear Clear   Leukocytes, UA Negative Negative   Protein, UA 1+ (A) Negative/Trace   Glucose, UA 3+ (A) Negative   Ketones, UA Negative Negative   RBC, UA Negative Negative   Bilirubin, UA Negative Negative   Urobilinogen, Ur 0.2 0.2 - 1.0 mg/dL   Nitrite, UA Negative Negative      Assessment & Plan:   Problem List Items Addressed This Visit    None    Visit Diagnoses    Acute bronchitis, unspecified organism    -  Primary   Will treat with prednisone, azithromycin, tussionex and tessalon perles.   Wheezing       Slightly better following neb. Will treat as above.    Relevant Medications   albuterol (PROVENTIL) (2.5 MG/3ML) 0.083% nebulizer solution 2.5 mg       Follow up plan: Return in about 2 weeks (around 02/25/2017) for Recheck lungs.

## 2017-02-25 ENCOUNTER — Encounter: Payer: Self-pay | Admitting: Family Medicine

## 2017-02-25 ENCOUNTER — Ambulatory Visit (INDEPENDENT_AMBULATORY_CARE_PROVIDER_SITE_OTHER): Payer: Commercial Managed Care - PPO | Admitting: Family Medicine

## 2017-02-25 VITALS — BP 144/86 | HR 116 | Temp 98.5°F | Wt 254.0 lb

## 2017-02-25 DIAGNOSIS — J209 Acute bronchitis, unspecified: Secondary | ICD-10-CM

## 2017-02-25 DIAGNOSIS — J0100 Acute maxillary sinusitis, unspecified: Secondary | ICD-10-CM | POA: Diagnosis not present

## 2017-02-25 MED ORDER — AMOXICILLIN-POT CLAVULANATE 875-125 MG PO TABS: 1.0000 | ORAL_TABLET | Freq: Two times a day (BID) | ORAL | 0 refills | Status: DC

## 2017-02-25 NOTE — Progress Notes (Signed)
BP (!) 144/86 (BP Location: Left Arm, Patient Position: Sitting)   Pulse (!) 116   Temp 98.5 F (36.9 C)   Wt 254 lb (115.2 kg)   SpO2 94%   BMI 38.62 kg/m    Subjective:    Patient ID: Casey Cole, male    DOB: May 25, 1975, 41 y.o.   MRN: [id number]  HPI: Neill Jurewicz is a 41 y.o. male  No chief complaint on file.  UPPER RESPIRATORY TRACT INFECTION- did better with with the medicine, but not quite there Worst symptom: stuffiness Fever: no Cough: yes Shortness of breath: no Wheezing: no Chest pain: no Chest tightness: no Chest congestion: no Nasal congestion: yes Runny nose: yes Post nasal drip: yes Sneezing: yes Sore throat: no Swollen glands: no Sinus pressure: yes Headache: yes Face pain: no Toothache: no Ear pain: no  Ear pressure: yes bilateral Eyes red/itching:yes Eye drainage/crusting: no  Vomiting: no Rash: no Fatigue: yes Sick contacts: no Strep contacts: no  Context: better Recurrent sinusitis: no Relief with OTC cold/cough medications: no  Treatments attempted: pseudoephedrine   Relevant past medical, surgical, family and social history reviewed and updated as indicated. Interim medical history since our last visit reviewed. Allergies and medications reviewed and updated.  Review of Systems  Constitutional: Positive for fatigue. Negative for activity change, appetite change, chills, diaphoresis, fever and unexpected weight change.  HENT: Positive for congestion, postnasal drip, rhinorrhea, sinus pain and sinus pressure. Negative for dental problem, drooling, ear discharge, ear pain, facial swelling, hearing loss, mouth sores, nosebleeds, sneezing, sore throat, tinnitus, trouble swallowing and voice change.   Respiratory: Negative.   Cardiovascular: Negative.   Psychiatric/Behavioral: Negative.     Per HPI unless specifically indicated above     Objective:    BP (!) 144/86 (BP Location: Left Arm, Patient Position: Sitting)   Pulse (!)  116   Temp 98.5 F (36.9 C)   Wt 254 lb (115.2 kg)   SpO2 94%   BMI 38.62 kg/m   Wt Readings from Last 3 Encounters:  02/25/17 254 lb (115.2 kg)  02/11/17 257 lb 5 oz (116.7 kg)  01/17/17 254 lb 11.2 oz (115.5 kg)    Physical Exam  Constitutional: He is oriented to person, place, and time. He appears well-developed and well-nourished. No distress.  HENT:  Head: Normocephalic and atraumatic.  Right Ear: Hearing, tympanic membrane, external ear and ear canal normal.  Left Ear: Hearing, tympanic membrane, external ear and ear canal normal.  Nose: Mucosal edema and rhinorrhea present. Right sinus exhibits no maxillary sinus tenderness and no frontal sinus tenderness. Left sinus exhibits maxillary sinus tenderness. Left sinus exhibits no frontal sinus tenderness.  Mouth/Throat: Uvula is midline, oropharynx is clear and moist and mucous membranes are normal. No oropharyngeal exudate.  Eyes: Pupils are equal, round, and reactive to light. Conjunctivae, EOM and lids are normal. Right eye exhibits no discharge. Left eye exhibits no discharge. No scleral icterus.  Neck: Normal range of motion. Neck supple. No JVD present. No tracheal deviation present. No thyromegaly present.  Cardiovascular: Normal rate, regular rhythm, normal heart sounds and intact distal pulses.   Pulmonary/Chest: Effort normal. No stridor. No respiratory distress. He has wheezes. He has no rales. He exhibits no tenderness.  Musculoskeletal: Normal range of motion.  Lymphadenopathy:    He has cervical adenopathy.  Neurological: He is alert and oriented to person, place, and time.  Skin: Skin is warm, dry and intact. No rash noted. He is not diaphoretic. No erythema.  No pallor.  Psychiatric: He has a normal mood and affect. His speech is normal and behavior is normal. Judgment and thought content normal. Cognition and memory are normal.    Results for orders placed or performed in visit on 01/17/17  Urinalysis, Complete    Result Value Ref Range   Specific Gravity, UA >1.030 (H) 1.005 - 1.030   pH, UA 5.5 5.0 - 7.5   Color, UA Yellow Yellow   Appearance Ur Clear Clear   Leukocytes, UA Negative Negative   Protein, UA 1+ (A) Negative/Trace   Glucose, UA 3+ (A) Negative   Ketones, UA Negative Negative   RBC, UA Negative Negative   Bilirubin, UA Negative Negative   Urobilinogen, Ur 0.2 0.2 - 1.0 mg/dL   Nitrite, UA Negative Negative      Assessment & Plan:   Problem List Items Addressed This Visit    None    Visit Diagnoses    Acute non-recurrent maxillary sinusitis    -  Primary   Will treat with augmentin. Call if not getting better or getting worse. Continue to monitor.    Relevant Medications   amoxicillin-clavulanate (AUGMENTIN) 875-125 MG tablet   Acute bronchitis, unspecified organism       Resolved. Call with any concerns.        Follow up plan: Return As scheduled, for DM visit.

## 2017-02-26 ENCOUNTER — Telehealth: Payer: Self-pay | Admitting: Family Medicine

## 2017-02-26 NOTE — Telephone Encounter (Signed)
Routing to provider  

## 2017-02-26 NOTE — Telephone Encounter (Signed)
Spoke with pt and read the message from Dr Wynetta Emery. He stated he just wanted to follow up with her so that she was aware of what was going on but he would call back in 48 hours if he wasn't getting better.

## 2017-02-26 NOTE — Telephone Encounter (Signed)
He was just started on the antibiotic yesterday. I would give the medicine at least 48 hours to work. If he is feeling significantly worse, we should see him. But he needs to give the medicine some time to work.

## 2017-02-26 NOTE — Telephone Encounter (Signed)
Copied from [facility] #[id number]. Topic: Quick Communication - See Telephone Encounter >> Feb 26, 2017  8:56 AM Robina Ade, Helene Kelp D wrote: Patient called and said that Dr. Wynetta Emery told him if he wasn't feeling better to call her back. Patient is  running a high fever of 102.7 and vomiting. He would like to know what to do? He did not schedule appt because needs to talk to provider before since he was in the office yesterday.

## 2017-04-15 ENCOUNTER — Encounter: Payer: Self-pay | Admitting: Family Medicine

## 2017-05-02 ENCOUNTER — Ambulatory Visit: Payer: Commercial Managed Care - PPO | Admitting: Family Medicine

## 2017-05-08 ENCOUNTER — Ambulatory Visit: Payer: Commercial Managed Care - PPO | Admitting: Family Medicine

## 2017-05-08 ENCOUNTER — Encounter: Payer: Self-pay | Admitting: Family Medicine

## 2017-05-08 VITALS — BP 139/96 | HR 104 | Temp 98.2°F | Wt 254.0 lb

## 2017-05-08 DIAGNOSIS — J069 Acute upper respiratory infection, unspecified: Secondary | ICD-10-CM | POA: Diagnosis not present

## 2017-05-08 MED ORDER — ALBUTEROL SULFATE HFA 108 (90 BASE) MCG/ACT IN AERS: 2.0000 | INHALATION_SPRAY | Freq: Four times a day (QID) | RESPIRATORY_TRACT | 0 refills | Status: DC | PRN

## 2017-05-08 MED ORDER — AZITHROMYCIN 250 MG PO TABS: ORAL_TABLET | ORAL | 0 refills | Status: DC

## 2017-05-08 MED ORDER — HYDROCOD POLST-CPM POLST ER 10-8 MG/5ML PO SUER: 5.0000 mL | Freq: Two times a day (BID) | ORAL | 0 refills | Status: DC | PRN

## 2017-05-08 MED ORDER — PREDNISONE 10 MG PO TABS: ORAL_TABLET | ORAL | 0 refills | Status: DC

## 2017-05-08 MED ORDER — BENZONATATE 200 MG PO CAPS: 200.0000 mg | ORAL_CAPSULE | Freq: Two times a day (BID) | ORAL | 0 refills | Status: DC | PRN

## 2017-05-08 NOTE — Progress Notes (Signed)
   BP (!) 139/96   Pulse (!) 104   Temp 98.2 F (36.8 C) (Oral)   Wt 254 lb (115.2 kg)   SpO2 94%   BMI 38.62 kg/m    Subjective:    Patient ID: Casey Cole, male    DOB: 08/06/1975, 42 y.o.   MRN: [id number]  HPI: Casey Cole is a 42 y.o. male  Chief Complaint  Patient presents with  . Chest congestion  . Ear Fullness    Right   Productive cough, congestion, ear pain, chest tightness, SOB, and fatigue x 1.5 weeks. Denies fevers, CP, N/V/D. Taking dayquil and mucinex D with no relief. No sick contacts noted.   Relevant past medical, surgical, family and social history reviewed and updated as indicated. Interim medical history since our last visit reviewed. Allergies and medications reviewed and updated.  Review of Systems  Constitutional: Positive for fatigue.  HENT: Positive for congestion and ear pain.   Respiratory: Positive for cough and chest tightness.   Cardiovascular: Negative.   Gastrointestinal: Negative.   Genitourinary: Negative.   Musculoskeletal: Negative.   Neurological: Negative.   Psychiatric/Behavioral: Negative.    Per HPI unless specifically indicated above     Objective:    BP (!) 139/96   Pulse (!) 104   Temp 98.2 F (36.8 C) (Oral)   Wt 254 lb (115.2 kg)   SpO2 94%   BMI 38.62 kg/m   Wt Readings from Last 3 Encounters:  05/08/17 254 lb (115.2 kg)  02/25/17 254 lb (115.2 kg)  02/11/17 257 lb 5 oz (116.7 kg)    Physical Exam  Constitutional: He is oriented to person, place, and time. He appears well-developed and well-nourished.  HENT:  Head: Atraumatic.  Right Ear: External ear normal.  Left Ear: External ear normal.  Oropharynx and nasal mucosa injected Thick drainage present in nares  Eyes: Conjunctivae are normal. Pupils are equal, round, and reactive to light. No scleral icterus.  Neck: Normal range of motion. Neck supple.  Cardiovascular: Normal rate and normal heart sounds.  Pulmonary/Chest: Effort normal. No respiratory  distress. He has wheezes (minimal).  Musculoskeletal: Normal range of motion.  Neurological: He is alert and oriented to person, place, and time.  Skin: Skin is warm and dry.  Psychiatric: He has a normal mood and affect. His behavior is normal.  Nursing note and vitals reviewed.     Assessment & Plan:   Problem List Items Addressed This Visit    None    Visit Diagnoses    Upper respiratory tract infection, unspecified type    -  Primary   Will tx with zpack, tessalon, and tussionex. Supportive care reviewed, mucinex, saline drops, humidifier. F/u if worsening or no improvement   Relevant Medications   azithromycin (ZITHROMAX) 250 MG tablet      Follow up plan: Return if symptoms worsen or fail to improve.

## 2017-05-11 NOTE — Patient Instructions (Signed)
Follow up as needed

## 2017-06-26 ENCOUNTER — Other Ambulatory Visit: Payer: Commercial Managed Care - PPO

## 2017-06-26 DIAGNOSIS — Z9852 Vasectomy status: Secondary | ICD-10-CM | POA: Diagnosis not present

## 2017-06-28 LAB — POST-VAS SPERM EVALUATION,QUAL: SEMEN VOLUME: 1.6 mL

## 2017-07-04 DIAGNOSIS — J069 Acute upper respiratory infection, unspecified: Secondary | ICD-10-CM | POA: Diagnosis not present

## 2017-07-07 ENCOUNTER — Encounter: Payer: Self-pay | Admitting: Family Medicine

## 2017-07-07 ENCOUNTER — Ambulatory Visit: Payer: Commercial Managed Care - PPO | Admitting: Family Medicine

## 2017-07-07 VITALS — BP 121/79 | HR 101 | Temp 98.1°F | Wt 253.0 lb

## 2017-07-07 DIAGNOSIS — J069 Acute upper respiratory infection, unspecified: Secondary | ICD-10-CM | POA: Diagnosis not present

## 2017-07-07 MED ORDER — AMOXICILLIN-POT CLAVULANATE 875-125 MG PO TABS: 1.0000 | ORAL_TABLET | Freq: Two times a day (BID) | ORAL | 0 refills | Status: DC

## 2017-07-07 NOTE — Progress Notes (Signed)
   BP 121/79   Pulse (!) 101   Temp 98.1 F (36.7 C) (Oral)   Wt 253 lb (114.8 kg)   SpO2 94%   BMI 38.47 kg/m    Subjective:    Patient ID: Zale Marcotte, male    DOB: 12/05/1975, 42 y.o.   MRN: [id number]  HPI: Rama Mcclintock is a 42 y.o. male  Chief Complaint  Patient presents with  . Sore Throat  . Headache  . Cough   Pt here today with 1.5 weeks of congestion, facial pain and pressure, sore throat, headache, productive cough of thick yellow sputum. Sore throat seems to be most bothersome at this time. Denies fevers, chills, generalized aches, CP, SOB. Has been taking some mucinex with no relief. Several sick contacts.   Relevant past medical, surgical, family and social history reviewed and updated as indicated. Interim medical history since our last visit reviewed. Allergies and medications reviewed and updated.  Review of Systems  Per HPI unless specifically indicated above     Objective:    BP 121/79   Pulse (!) 101   Temp 98.1 F (36.7 C) (Oral)   Wt 253 lb (114.8 kg)   SpO2 94%   BMI 38.47 kg/m   Wt Readings from Last 3 Encounters:  07/07/17 253 lb (114.8 kg)  05/08/17 254 lb (115.2 kg)  02/25/17 254 lb (115.2 kg)    Physical Exam  Constitutional: He is oriented to person, place, and time. He appears well-developed and well-nourished. No distress.  HENT:  Head: Atraumatic.  Right Ear: External ear normal.  Left Ear: External ear normal.  Oropharynx and nasal mucosa erythematous and edematous with thick drainage present B/l middle ear effusion with TM injection  Eyes: Conjunctivae are normal. Pupils are equal, round, and reactive to light. No scleral icterus.  Neck: Normal range of motion. Neck supple.  Cardiovascular: Normal rate and normal heart sounds.  Pulmonary/Chest: Effort normal and breath sounds normal. No respiratory distress.  Musculoskeletal: Normal range of motion.  Neurological: He is alert and oriented to person, place, and time.  Skin:  Skin is warm and dry.  Psychiatric: He has a normal mood and affect. His behavior is normal.  Nursing note and vitals reviewed.   Results for orders placed or performed in visit on 06/26/17  Post-Vas Sperm Evaluation,Qual  Result Value Ref Range   Volume 1.6 Not Estab. mL   Post-Vas Sperm, Qual Comment Absent      Assessment & Plan:   Problem List Items Addressed This Visit    None    Visit Diagnoses    Upper respiratory tract infection, unspecified type    -  Primary   Will tx with augmentin, nasal sprays, and OTC cold remedies. Discussed starting some immune supportive supplements alongside the taltz given frequent URIs       Follow up plan: Return if symptoms worsen or fail to improve.

## 2017-07-09 NOTE — Patient Instructions (Signed)
Follow up as needed

## 2017-07-10 ENCOUNTER — Telehealth: Payer: Self-pay

## 2017-07-10 NOTE — Telephone Encounter (Signed)
Nickie Retort, MD  Lestine Box, LPN        Please let patient know that semen analysis showed no sperm. He needs to repeat this in 1 month prior to being cleared to stop using other forms of birth control. Thanks.    Letter sent.

## 2017-09-09 ENCOUNTER — Ambulatory Visit: Payer: Commercial Managed Care - PPO | Admitting: Family Medicine

## 2017-09-09 ENCOUNTER — Encounter: Payer: Self-pay | Admitting: Family Medicine

## 2017-09-09 VITALS — BP 129/88 | HR 100 | Temp 98.3°F | Wt 255.1 lb

## 2017-09-09 DIAGNOSIS — E119 Type 2 diabetes mellitus without complications: Secondary | ICD-10-CM

## 2017-09-09 DIAGNOSIS — F3342 Major depressive disorder, recurrent, in full remission: Secondary | ICD-10-CM

## 2017-09-09 DIAGNOSIS — E782 Mixed hyperlipidemia: Secondary | ICD-10-CM | POA: Diagnosis not present

## 2017-09-09 DIAGNOSIS — M79641 Pain in right hand: Secondary | ICD-10-CM

## 2017-09-09 DIAGNOSIS — I129 Hypertensive chronic kidney disease with stage 1 through stage 4 chronic kidney disease, or unspecified chronic kidney disease: Secondary | ICD-10-CM

## 2017-09-09 LAB — UA/M W/RFLX CULTURE, ROUTINE
Bilirubin, UA: NEGATIVE
Leukocytes, UA: NEGATIVE
NITRITE UA: NEGATIVE
Protein, UA: NEGATIVE
RBC, UA: NEGATIVE
Specific Gravity, UA: 1.02 (ref 1.005–1.030)
UUROB: 0.2 mg/dL (ref 0.2–1.0)
pH, UA: 5.5 (ref 5.0–7.5)

## 2017-09-09 LAB — MICROALBUMIN, URINE WAIVED
Creatinine, Urine Waived: 200 mg/dL (ref 10–300)
Microalb, Ur Waived: 30 mg/L — ABNORMAL HIGH (ref 0–19)

## 2017-09-09 LAB — BAYER DCA HB A1C WAIVED: HB A1C (BAYER DCA - WAIVED): 8.4 % — ABNORMAL HIGH (ref <7.0)

## 2017-09-09 MED ORDER — NAPROXEN 500 MG PO TABS: 500.0000 mg | ORAL_TABLET | Freq: Two times a day (BID) | ORAL | 3 refills | Status: DC

## 2017-09-09 MED ORDER — DAPAGLIFLOZIN PRO-METFORMIN ER 5-1000 MG PO TB24: 1.0000 | ORAL_TABLET | Freq: Two times a day (BID) | ORAL | 1 refills | Status: DC

## 2017-09-09 MED ORDER — GABAPENTIN 300 MG PO CAPS: 300.0000 mg | ORAL_CAPSULE | Freq: Every day | ORAL | 2 refills | Status: DC

## 2017-09-09 NOTE — Assessment & Plan Note (Signed)
Doing well off medicine. Continue to monitor. Call with any concerns. Continue to monitor.  ?

## 2017-09-09 NOTE — Assessment & Plan Note (Addendum)
Not under good control with A1c of 8.4. Continue current regimen. Continue to monitor. Call with any concerns. Refills given.

## 2017-09-09 NOTE — Progress Notes (Signed)
BP 129/88 (BP Location: Left Arm, Patient Position: Sitting, Cuff Size: Large)   Pulse 100   Temp 98.3 F (36.8 C)   Wt 255 lb 1 oz (115.7 kg)   SpO2 98%   BMI 38.78 kg/m    Subjective:    Patient ID: Casey Cole, male    DOB: 08/30/1975, 42 y.o.   MRN: [id number]  HPI: Curley Hogen is a 42 y.o. male  Chief Complaint  Patient presents with  . Hand Pain  . Diabetes  . Hypertension  . Hyperlipidemia  . Depression   HAND PAIN Duration: 3 weeks Involved hand: right Mechanism of injury: tightening screws in the garage, no injury, but since then has been hurting Location: palmar Onset: sudden Severity: moderate  Quality: aching  Frequency: constant Radiation: yes goes into his elbow Aggravating factors: moving, reaching Alleviating factors: brace and ibuprofen Treatments attempted: ibuprofen and brace Relief with NSAIDs?: mild Weakness: yes Numbness: yes Redness: no Swelling:no Bruising: no Fevers: no  HYPERTENSION / HYPERLIPIDEMIA Satisfied with current treatment? yes Duration of hypertension: chronic BP monitoring frequency: not checking BP range:  BP medication side effects: no Past BP meds: none Duration of hyperlipidemia: chronic Cholesterol medication side effects: Not on anything Cholesterol supplements: none Past cholesterol medications: none Medication compliance: excellent compliance Aspirin: no Recent stressors: no Recurrent headaches: no Visual changes: no Palpitations: no Dyspnea: no Chest pain: no Lower extremity edema: no Dizzy/lightheaded: no  DIABETES Hypoglycemic episodes:no Polydipsia/polyuria: no Visual disturbance: no Chest pain: no Paresthesias: no Glucose Monitoring: no Taking Insulin?: no Blood Pressure Monitoring: not checking Retinal Examination: Not up to Date Foot Exam: Done today Diabetic Education: Not Completed Pneumovax: Up to Date Influenza: Up to Date Aspirin: no  DEPRESSION Mood status:  controlled Satisfied with current treatment?: yes Symptom severity: N/A  Duration of current treatment : Off medicine for about a year Depressed mood: no Anxious mood: no Anhedonia: no Significant weight loss or gain: no Insomnia: no  Fatigue: no Feelings of worthlessness or guilt: no Impaired concentration/indecisiveness: no Suicidal ideations: no Hopelessness: no Crying spells: no Depression screen [facility] 2/9 09/09/2017 05/08/2017 10/28/2016 12/08/2015 10/27/2015  Decreased Interest 2 0 0 2 2  Down, Depressed, Hopeless 1 0 0 2 2  PHQ - 2 Score 3 0 0 4 4  Altered sleeping 2 - - 0 2  Tired, decreased energy 1 - - 2 2  Change in appetite 1 - - 2 2  Feeling bad or failure about yourself  1 - - 1 1  Trouble concentrating 2 - - 2 1  Moving slowly or fidgety/restless 0 - - 1 1  Suicidal thoughts 0 - - 0 0  PHQ-9 Score 10 - - 12 13  Difficult doing work/chores Somewhat difficult - - Somewhat difficult Very difficult     Relevant past medical, surgical, family and social history reviewed and updated as indicated. Interim medical history since our last visit reviewed. Allergies and medications reviewed and updated.  Review of Systems  Constitutional: Negative.   Respiratory: Negative.   Cardiovascular: Negative.   Musculoskeletal: Positive for arthralgias and myalgias. Negative for back pain, gait problem, joint swelling, neck pain and neck stiffness.  Skin: Negative.   Neurological: Positive for weakness and numbness. Negative for dizziness, tremors, seizures, syncope, facial asymmetry, speech difficulty, light-headedness and headaches.  Hematological: Negative.   Psychiatric/Behavioral: Negative.     Per HPI unless specifically indicated above     Objective:    BP 129/88 (BP Location: Left Arm,  Patient Position: Sitting, Cuff Size: Large)   Pulse 100   Temp 98.3 F (36.8 C)   Wt 255 lb 1 oz (115.7 kg)   SpO2 98%   BMI 38.78 kg/m   Wt Readings from Last 3 Encounters:   09/09/17 255 lb 1 oz (115.7 kg)  07/07/17 253 lb (114.8 kg)  05/08/17 254 lb (115.2 kg)    Physical Exam  Constitutional: He is oriented to person, place, and time. He appears well-developed and well-nourished. No distress.  HENT:  Head: Normocephalic and atraumatic.  Right Ear: Hearing normal.  Left Ear: Hearing normal.  Nose: Nose normal.  Eyes: Conjunctivae and lids are normal. Right eye exhibits no discharge. Left eye exhibits no discharge. No scleral icterus.  Cardiovascular: Normal rate, regular rhythm, normal heart sounds and intact distal pulses. Exam reveals no gallop and no friction rub.  No murmur heard. Pulmonary/Chest: Effort normal and breath sounds normal. No stridor. No respiratory distress. He has no wheezes. He exhibits no tenderness.  Musculoskeletal: Normal range of motion.  Neurological: He is alert and oriented to person, place, and time. He displays normal reflexes. No cranial nerve deficit or sensory deficit. He exhibits normal muscle tone. Coordination normal.  + phalens, + tinel's on the R  Skin: Skin is warm, dry and intact. Capillary refill takes less than 2 seconds. No rash noted. He is not diaphoretic. No erythema. No pallor.  Psychiatric: He has a normal mood and affect. His speech is normal and behavior is normal. Judgment and thought content normal. Cognition and memory are normal.  Nursing note and vitals reviewed.   Results for orders placed or performed in visit on 06/26/17  Post-Vas Sperm Evaluation,Qual  Result Value Ref Range   Volume 1.6 Not Estab. mL   Post-Vas Sperm, Qual Comment Absent      Assessment & Plan:   Problem List Items Addressed This Visit      Endocrine   Diabetes mellitus without complication ([place])    Not under good control with A1c of 8.4. Continue current regimen. Continue to monitor. Call with any concerns. Refills given.       Relevant Medications   Dapagliflozin-metFORMIN HCl ER (XIGDUO XR) 09-998 MG TB24   Other  Relevant Orders   CBC with Differential/Platelet   Bayer DCA Hb A1c Waived   Comprehensive metabolic panel   Microalbumin, Urine Waived   TSH   UA/M w/rflx Culture, Routine     Genitourinary   Benign hypertensive renal disease    Under good control. Continue diet and exercise. Call with any concerns. Continue to monitor.       Relevant Orders   CBC with Differential/Platelet   Comprehensive metabolic panel   Microalbumin, Urine Waived   TSH   UA/M w/rflx Culture, Routine     Other   Hyperlipidemia    Rechecking levels today. Continue diet and exercise. Call with any concerns. Continue to monitor.       Relevant Orders   CBC with Differential/Platelet   Comprehensive metabolic panel   Lipid Panel w/o Chol/HDL Ratio   TSH   UA/M w/rflx Culture, Routine   Depression    Doing well off medicine. Continue to monitor. Call with any concerns. Continue to monitor.       Relevant Orders   CBC with Differential/Platelet   Comprehensive metabolic panel   TSH   UA/M w/rflx Culture, Routine    Other Visit Diagnoses    Pain of right hand    -  Primary   Likely CTS- will treat with naproxen, gabapentin, brace and exercises. If not better in the next 2-3 weeks, will refer to hand specialist.       Follow up plan: Return in about 3 months (around 12/10/2017) for follow up.

## 2017-09-09 NOTE — Assessment & Plan Note (Signed)
Rechecking levels today. Continue diet and exercise. Call with any concerns. Continue to monitor.

## 2017-09-09 NOTE — Assessment & Plan Note (Signed)
Under good control. Continue diet and exercise. Call with any concerns. Continue to monitor.

## 2017-09-09 NOTE — Patient Instructions (Signed)
Carpal Tunnel Syndrome Carpal tunnel syndrome is a condition that causes pain in your hand and arm. The carpal tunnel is a narrow area that is on the palm side of your wrist. Repeated wrist motion or certain diseases may cause swelling in the tunnel. This swelling can pinch the main nerve in the wrist (median nerve). Follow these instructions at home: If you have a splint:  Wear it as told by your doctor. Remove it only as told by your doctor.  Loosen the splint if your fingers: ? Become numb and tingle. ? Turn blue and cold.  Keep the splint clean and dry. General instructions  Take over-the-counter and prescription medicines only as told by your doctor.  Rest your wrist from any activity that may be causing your pain. If needed, talk to your employer about changes that can be made in your work, such as getting a wrist pad to use while typing.  If directed, apply ice to the painful area: ? Put ice in a plastic bag. ? Place a towel between your skin and the bag. ? Leave the ice on for 20 minutes, 2-3 times per day.  Keep all follow-up visits as told by your doctor. This is important.  Do any exercises as told by your doctor, physical therapist, or occupational therapist. Contact a doctor if:  You have new symptoms.  Medicine does not help your pain.  Your symptoms get worse. This information is not intended to replace advice given to you by your health care provider. Make sure you discuss any questions you have with your health care provider. Document Released: 04/11/2011 Document Revised: 09/28/2015 Document Reviewed: 09/07/2014 Elsevier Interactive Patient Education  2018 [employer].  Wrist and Forearm Exercises Ask your health care provider which exercises are safe for you. Do exercises exactly as told by your health care provider and adjust them as directed. It is normal to feel mild stretching, pulling, tightness, or discomfort as you do these exercises, but you should  stop right away if you feel sudden pain or your pain gets worse. Do not begin these exercises until told by your health care provider. RANGE OF MOTION EXERCISES These exercises warm up your muscles and joints and improve the movement and flexibility of your injured wrist and forearm. These exercises also help to relieve pain, numbness, and tingling. These exercises are done using the muscles in your injured wrist and forearm. Exercise A: Wrist Flexion, Active 1. With your fingers relaxed, bend your wrist forward as far as you can. 2. Hold this position for __________ seconds. Repeat __________ times. Complete this exercise __________ times a day. Exercise B: Wrist Extension, Active 1. With your fingers relaxed, bend your wrist backward as far as you can. 2. Hold this position for __________ seconds. Repeat __________ times. Complete this exercise __________ times a day. Exercise C: Supination, Active  1. Stand or sit with your arms at your sides. 2. Bend your left / right elbow to an "L" shape (90 degrees). 3. Turn your palm upward until you feel a gentle stretch on the inside of your forearm. 4. Hold this position for __________ seconds. 5. Slowly return your palm to the starting position. Repeat __________ times. Complete this exercise __________ times a day. Exercise D: Pronation, Active  1. Stand or sit with your arms at your sides. 2. Bend your left / right elbow to an "L" shape (90 degrees). 3. Turn your palm downward until you feel a gentle stretch on the top of  your forearm. 4. Hold this position for __________ seconds. 5. Slowly return your palm to the starting position. Repeat __________ times. Complete this exercise __________ times a day. STRETCHING EXERCISES These exercises warm up your muscles and joints and improve the movement and flexibility of your injured wrist and forearm. These exercises also help to relieve pain, numbness, and tingling. These exercises are done using  your healthy wrist and forearm to help stretch the muscles in your injured wrist and forearm. Exercise E: Wrist Flexion, Passive  1. Extend your left / right arm in front of you, relax your wrist, and point your fingers downward. 2. Gently push on the back of your hand. Stop when you feel a gentle stretch on the top of your forearm. 3. Hold this position for __________ seconds. Repeat __________ times. Complete this exercise __________ times a day. Exercise F: Wrist Extension, Passive  1. Extend your left / right arm in front of you and turn your palm upward. 2. Gently pull your palm and fingertips back so your fingers point downward. You should feel a gentle stretch on the palm-side of your forearm. 3. Hold this position for __________ seconds. Repeat __________ times. Complete this exercise __________ times a day. Exercise G: Forearm Rotation, Supination, Passive 1. Sit with your left / right elbow bent to an "L" shape (90 degrees) with your forearm resting on a table. 2. Keeping your upper body and shoulder still, use your other hand to rotate your forearm palm-up until you feel a gentle to moderate stretch. 3. Hold this position for __________ seconds. 4. Slowly release the stretch and return to the starting position. Repeat __________ times. Complete this exercise __________ times a day. Exercise H: Forearm Rotation, Pronation, Passive 1. Sit with your left / right elbow bent to an "L" shape (90 degrees) with your forearm resting on a table. 2. Keeping your upper body and shoulder still, use your other hand to rotate your forearm palm-down until you feel a gentle to moderate stretch. 3. Hold this position for __________ seconds. 4. Slowly release the stretch and return to the starting position. Repeat __________ times. Complete this exercise __________ times a day. STRENGTHENING EXERCISES These exercises build strength and endurance in your wrist and forearm. Endurance is the ability  to use your muscles for a long time, even after they get tired. Exercise I: Wrist Flexors  1. Sit with your left / right forearm supported on a table and your hand resting palm-up over the edge of the table. Your elbow should be bent to an "L" shape (about 90 degrees) and be below the level of your shoulder. 2. Hold a __________ weight in your left / right hand. Or, hold a rubber exercise band or tube in both hands, keeping your hands at the same level and hip distance apart. There should be a slight tension in the exercise band or tube. 3. Slowly curl your hand up toward your forearm. 4. Hold this position for __________ seconds. 5. Slowly lower your hand back to the starting position. Repeat __________ times. Complete this exercise __________ times a day. Exercise J: Wrist Extensors  1. Sit with your left / right forearm supported on a table and your hand resting palm-down over the edge of the table. Your elbow should be bent to an "L" shape (about 90 degrees) and be below the level of your shoulder. 2. Hold a __________ weight in your left / right hand. Or, hold a rubber exercise band or tube in  both hands, keeping your hands at the same level and hip distance apart. There should be a slight tension in the exercise band or tube. 3. Slowly curl your hand up toward your forearm. 4. Hold this position for __________ seconds. 5. Slowly lower your hand back to the starting position. Repeat __________ times. Complete this exercise __________ times a day. Exercise K: Forearm Rotation, Supination  1. Sit with your left / right forearm supported on a table and your hand resting palm-down. Your elbow should be at your side, bent to an "L" shape (about 90 degrees), and below the level of your shoulder. Keep your wrist stable and in a neutral position throughout the exercise. 2. Gently hold a lightweight hammer with your left / right hand. 3. Without moving your elbow or wrist, slowly rotate your palm  upward to a thumbs-up position. 4. Hold this position for __________ seconds. 5. Slowly return your forearm to the starting position. Repeat __________ times. Complete this exercise __________ times a day. Exercise L: Forearm Rotation, Pronation  1. Sit with your left / right forearm supported on a table and your hand resting palm-up. Your elbow should be at your side, bent to an "L" shape (about 90 degrees), and below the level of your shoulder. Keep your wrist stable. Do not allow it to move backward or forward during the exercise. 2. Gently hold a lightweight hammer with your left / right hand. 3. Without moving your elbow or wrist, slowly rotate your palm and hand upward to a thumbs-up position. 4. Hold this position for __________ seconds. 5. Slowly return your forearm to the starting position. Repeat __________ times. Complete this exercise __________ times a day. Exercise M: Grip Strengthening  1. Hold one of these items in your left / right hand: play dough, therapy putty, a dense sponge, a stress ball, or a large, rolled sock. 2. Squeeze as hard as you can without increasing pain. 3. Hold this position for __________ seconds. 4. Slowly release your grip. Repeat __________ times. Complete this exercise __________ times a day. This information is not intended to replace advice given to you by your health care provider. Make sure you discuss any questions you have with your health care provider. Document Released: 03/06/2005 Document Revised: 01/15/2016 Document Reviewed: 01/15/2015 Elsevier Interactive Patient Education  [employer].

## 2017-09-10 LAB — CBC WITH DIFFERENTIAL/PLATELET
BASOS ABS: 0 10*3/uL (ref 0.0–0.2)
BASOS: 0 %
EOS (ABSOLUTE): 0.2 10*3/uL (ref 0.0–0.4)
Eos: 2 %
Hematocrit: 50.8 % (ref 37.5–51.0)
Hemoglobin: 16.7 g/dL (ref 13.0–17.7)
Immature Grans (Abs): 0 10*3/uL (ref 0.0–0.1)
Immature Granulocytes: 0 %
Lymphocytes Absolute: 2.4 10*3/uL (ref 0.7–3.1)
Lymphs: 26 %
MCH: 30.9 pg (ref 26.6–33.0)
MCHC: 32.9 g/dL (ref 31.5–35.7)
MCV: 94 fL (ref 79–97)
MONOS ABS: 0.6 10*3/uL (ref 0.1–0.9)
Monocytes: 6 %
NEUTROS ABS: 6 10*3/uL (ref 1.4–7.0)
NEUTROS PCT: 66 %
PLATELETS: 176 10*3/uL (ref 150–379)
RBC: 5.41 x10E6/uL (ref 4.14–5.80)
RDW: 13.8 % (ref 12.3–15.4)
WBC: 9.1 10*3/uL (ref 3.4–10.8)

## 2017-09-10 LAB — COMPREHENSIVE METABOLIC PANEL
A/G RATIO: 1.7 (ref 1.2–2.2)
ALK PHOS: 82 IU/L (ref 39–117)
ALT: 22 IU/L (ref 0–44)
AST: 15 IU/L (ref 0–40)
Albumin: 4.2 g/dL (ref 3.5–5.5)
BUN/Creatinine Ratio: 13 (ref 9–20)
BUN: 12 mg/dL (ref 6–24)
Bilirubin Total: 0.6 mg/dL (ref 0.0–1.2)
CHLORIDE: 98 mmol/L (ref 96–106)
CO2: 19 mmol/L — ABNORMAL LOW (ref 20–29)
Calcium: 9 mg/dL (ref 8.7–10.2)
Creatinine, Ser: 0.92 mg/dL (ref 0.76–1.27)
GFR calc non Af Amer: 104 mL/min/{1.73_m2} (ref >59)
GFR, EST AFRICAN AMERICAN: 120 mL/min/{1.73_m2} (ref >59)
Globulin, Total: 2.5 g/dL (ref 1.5–4.5)
Glucose: 273 mg/dL — ABNORMAL HIGH (ref 65–99)
POTASSIUM: 3.4 mmol/L — AB (ref 3.5–5.2)
Sodium: 138 mmol/L (ref 134–144)
Total Protein: 6.7 g/dL (ref 6.0–8.5)

## 2017-09-10 LAB — LIPID PANEL W/O CHOL/HDL RATIO
CHOLESTEROL TOTAL: 187 mg/dL (ref 100–199)
HDL: 31 mg/dL — AB (ref >39)
Triglycerides: 446 mg/dL — ABNORMAL HIGH (ref 0–149)

## 2017-09-10 LAB — TSH: TSH: 0.588 u[IU]/mL (ref 0.450–4.500)

## 2017-09-23 ENCOUNTER — Telehealth: Payer: Self-pay | Admitting: Family Medicine

## 2017-09-23 DIAGNOSIS — M79641 Pain in right hand: Secondary | ICD-10-CM

## 2017-09-23 NOTE — Telephone Encounter (Signed)
LVM regarding what Dr. Wynetta Emery said. DPR reviewed.

## 2017-09-23 NOTE — Telephone Encounter (Signed)
-----   Message from Valerie Roys, DO sent at 09/09/2017  1:55 PM EDT ----- Call about his hand pain

## 2017-09-23 NOTE — Telephone Encounter (Signed)
Please let him know I've got the referral to the hand specialist placed and they should be calling.

## 2017-09-23 NOTE — Telephone Encounter (Signed)
Patient states that it is about the same still sore and hurting.

## 2017-09-23 NOTE — Telephone Encounter (Signed)
Can you please check to see how his hand is doing and let me know.

## 2017-10-06 ENCOUNTER — Encounter: Payer: Self-pay | Admitting: Family Medicine

## 2017-10-08 ENCOUNTER — Encounter: Payer: Self-pay | Admitting: Family Medicine

## 2017-10-30 ENCOUNTER — Encounter: Payer: Self-pay | Admitting: Family Medicine

## 2017-10-30 ENCOUNTER — Ambulatory Visit (INDEPENDENT_AMBULATORY_CARE_PROVIDER_SITE_OTHER): Payer: Commercial Managed Care - PPO | Admitting: Family Medicine

## 2017-10-30 VITALS — BP 113/80 | HR 108 | Temp 98.3°F | Ht 69.4 in | Wt 249.5 lb

## 2017-10-30 DIAGNOSIS — F3342 Major depressive disorder, recurrent, in full remission: Secondary | ICD-10-CM

## 2017-10-30 DIAGNOSIS — E782 Mixed hyperlipidemia: Secondary | ICD-10-CM

## 2017-10-30 DIAGNOSIS — E119 Type 2 diabetes mellitus without complications: Secondary | ICD-10-CM

## 2017-10-30 DIAGNOSIS — I129 Hypertensive chronic kidney disease with stage 1 through stage 4 chronic kidney disease, or unspecified chronic kidney disease: Secondary | ICD-10-CM | POA: Diagnosis not present

## 2017-10-30 DIAGNOSIS — Z0001 Encounter for general adult medical examination with abnormal findings: Secondary | ICD-10-CM

## 2017-10-30 DIAGNOSIS — Z0000 Encounter for general adult medical examination without abnormal findings: Secondary | ICD-10-CM

## 2017-10-30 LAB — UA/M W/RFLX CULTURE, ROUTINE
BILIRUBIN UA: NEGATIVE
Ketones, UA: NEGATIVE
LEUKOCYTES UA: NEGATIVE
NITRITE UA: NEGATIVE
Protein, UA: NEGATIVE
RBC UA: NEGATIVE
SPEC GRAV UA: 1.015 (ref 1.005–1.030)
Urobilinogen, Ur: 0.2 mg/dL (ref 0.2–1.0)
pH, UA: 5.5 (ref 5.0–7.5)

## 2017-10-30 LAB — MICROALBUMIN, URINE WAIVED
Creatinine, Urine Waived: 200 mg/dL (ref 10–300)
Microalb, Ur Waived: 30 mg/L — ABNORMAL HIGH (ref 0–19)
Microalb/Creat Ratio: <30 mg/g (ref <30)

## 2017-10-30 LAB — BAYER DCA HB A1C WAIVED: HB A1C: 9.2 % — AB (ref <7.0)

## 2017-10-30 NOTE — Assessment & Plan Note (Signed)
Rechecking levels today. Continue to monitor. Call with any concerns.  

## 2017-10-30 NOTE — Assessment & Plan Note (Signed)
A1c up to 9.2. Really work on taking his medicine. Watch diet. Continue current regimen. Continue to monitor.

## 2017-10-30 NOTE — Assessment & Plan Note (Signed)
Having stress at work. Continue to monitor. Call with any concerns.

## 2017-10-30 NOTE — Assessment & Plan Note (Signed)
Under good control. Continue to monitor. Call with any concerns.  

## 2017-10-30 NOTE — Progress Notes (Signed)
BP 113/80 (BP Location: Left Arm, Patient Position: Sitting, Cuff Size: Large)   Pulse (!) 108   Temp 98.3 F (36.8 C)   Ht 5' 9.4" (1.763 m)   Wt 249 lb 8 oz (113.2 kg)   SpO2 96%   BMI 36.42 kg/m    Subjective:    Patient ID: Casey Cole, male    DOB: 1975-07-31, 42 y.o.   MRN: [id number]  HPI: Casey Cole is a 42 y.o. male presenting on 10/30/2017 for comprehensive medical examination. Current medical complaints include:  HYPERTENSION / HYPERLIPIDEMIA Satisfied with current treatment? yes Duration of hypertension: chronic BP monitoring frequency: not checking BP medication side effects: no Past BP meds: none Duration of hyperlipidemia: chronic Cholesterol medication side effects: Not on anything Cholesterol supplements: none Past cholesterol medications: none Medication compliance: fair compliance Aspirin: no Recent stressors: no Recurrent headaches: no Visual changes: no Palpitations: no Dyspnea: no Chest pain: no Lower extremity edema: no Dizzy/lightheaded: no  DIABETES Hypoglycemic episodes:no Polydipsia/polyuria: yes Visual disturbance: no Chest pain: no Paresthesias: no Glucose Monitoring: no Taking Insulin?: no Blood Pressure Monitoring: not checking Retinal Examination: Not up to Date Foot Exam: Done today Diabetic Education: Completed Pneumovax: Up to Date Influenza: Up to Date Aspirin: yes  He currently lives with: wife Interim Problems from his last visit: no  Depression Screen done today and results listed below:  Depression screen [facility] 2/9 10/30/2017 09/09/2017 05/08/2017 10/28/2016 12/08/2015  Decreased Interest 1 2 0 0 2  Down, Depressed, Hopeless 0 1 0 0 2  PHQ - 2 Score 1 3 0 0 4  Altered sleeping 2 2 - - 0  Tired, decreased energy 2 1 - - 2  Change in appetite 0 1 - - 2  Feeling bad or failure about yourself  1 1 - - 1  Trouble concentrating 2 2 - - 2  Moving slowly or fidgety/restless 0 0 - - 1  Suicidal thoughts 0 0 - - 0  PHQ-9  Score 8 10 - - 12  Difficult doing work/chores Somewhat difficult Somewhat difficult - - Somewhat difficult    Past Medical History:  Past Medical History:  Diagnosis Date  . Diabetes mellitus without complication ([place])   . Hyperlipidemia   . Hypertension   . Sleep apnea     Surgical History:  Past Surgical History:  Procedure Laterality Date  . CHOLECYSTECTOMY    . INCISE AND DRAIN ABCESS Left 07-12-15   left buttock  . INCISION AND DRAINAGE PERIRECTAL ABSCESS N/A 08/23/2015   Procedure: IRRIGATION AND DEBRIDEMENT PERIRECTAL ABSCESS;  Surgeon: Robert Bellow, MD;  Location: [facility] ORS;  Service: General;  Laterality: N/A;  Anal Scope  . VASECTOMY      Medications:  Current Outpatient Medications on File Prior to Visit  Medication Sig  . clobetasol ointment (TEMOVATE) 9.79 % Apply 1 application topically 2 (two) times daily.  . Dapagliflozin-metFORMIN HCl ER (XIGDUO XR) 09-998 MG TB24 Take 1 tablet by mouth 2 (two) times daily.  Marland Kitchen TALTZ 80 MG/ML SOAJ Take 80 mg by mouth every 30 (thirty) days.    No current facility-administered medications on file prior to visit.     Allergies:  No Known Allergies  Social History:  Social History   Socioeconomic History  . Marital status: Married    Spouse name: Not on file  . Number of children: Not on file  . Years of education: Not on file  . Highest education level: Not on file  Occupational History  .  Not on file  Social Needs  . Financial resource strain: Not on file  . Food insecurity:    Worry: Not on file    Inability: Not on file  . Transportation needs:    Medical: Not on file    Non-medical: Not on file  Tobacco Use  . Smoking status: Current Every Day Smoker    Packs/day: 1.00    Types: Cigarettes  . Smokeless tobacco: Never Used  Substance and Sexual Activity  . Alcohol use: Yes    Alcohol/week: 1.8 oz    Types: 3 Shots of liquor per week  . Drug use: No  . Sexual activity: Yes  Lifestyle  . Physical  activity:    Days per week: Not on file    Minutes per session: Not on file  . Stress: Not on file  Relationships  . Social connections:    Talks on phone: Not on file    Gets together: Not on file    Attends religious service: Not on file    Active member of club or organization: Not on file    Attends meetings of clubs or organizations: Not on file    Relationship status: Not on file  . Intimate partner violence:    Fear of current or ex partner: Not on file    Emotionally abused: Not on file    Physically abused: Not on file    Forced sexual activity: Not on file  Other Topics Concern  . Not on file  Social History Narrative  . Not on file   Social History   Tobacco Use  Smoking Status Current Every Day Smoker  . Packs/day: 1.00  . Types: Cigarettes  Smokeless Tobacco Never Used   Social History   Substance and Sexual Activity  Alcohol Use Yes  . Alcohol/week: 1.8 oz  . Types: 3 Shots of liquor per week    Family History:  Family History  Problem Relation Age of Onset  . Hypertension Father   . Stroke Paternal Grandfather   . Heart attack Paternal Grandfather     Past medical history, surgical history, medications, allergies, family history and social history reviewed with patient today and changes made to appropriate areas of the chart.   Review of Systems  Constitutional: Negative.   HENT: Negative.   Eyes: Negative.   Respiratory: Negative.   Cardiovascular: Negative.   Gastrointestinal: Negative.   Genitourinary: Negative.   Musculoskeletal: Negative.   Skin: Negative.   Neurological: Negative.   Endo/Heme/Allergies: Negative.   Psychiatric/Behavioral: Negative for depression, hallucinations, memory loss, substance abuse and suicidal ideas. The patient is nervous/anxious. The patient does not have insomnia.     All other ROS negative except what is listed above and in the HPI.      Objective:    BP 113/80 (BP Location: Left Arm, Patient  Position: Sitting, Cuff Size: Large)   Pulse (!) 108   Temp 98.3 F (36.8 C)   Ht 5' 9.4" (1.763 m)   Wt 249 lb 8 oz (113.2 kg)   SpO2 96%   BMI 36.42 kg/m   Wt Readings from Last 3 Encounters:  10/30/17 249 lb 8 oz (113.2 kg)  09/09/17 255 lb 1 oz (115.7 kg)  07/07/17 253 lb (114.8 kg)    Physical Exam  Constitutional: He is oriented to person, place, and time. He appears well-developed and well-nourished. No distress.  HENT:  Head: Normocephalic and atraumatic.  Right Ear: Hearing, tympanic membrane, external ear and  ear canal normal.  Left Ear: Hearing, tympanic membrane, external ear and ear canal normal.  Nose: Nose normal.  Mouth/Throat: Uvula is midline, oropharynx is clear and moist and mucous membranes are normal. No oropharyngeal exudate.  Eyes: Pupils are equal, round, and reactive to light. Conjunctivae, EOM and lids are normal. Right eye exhibits no discharge. Left eye exhibits no discharge. No scleral icterus.  Neck: Normal range of motion. Neck supple. No JVD present. No tracheal deviation present. No thyromegaly present.  Cardiovascular: Normal rate, regular rhythm, normal heart sounds and intact distal pulses. Exam reveals no gallop and no friction rub.  No murmur heard. Pulmonary/Chest: Effort normal and breath sounds normal. No stridor. No respiratory distress. He has no wheezes. He has no rales. He exhibits no tenderness.  Abdominal: Soft. Bowel sounds are normal. He exhibits no distension and no mass. There is no tenderness. There is no rebound and no guarding. No hernia. Hernia confirmed negative in the right inguinal area and confirmed negative in the left inguinal area.  Umbilical hernia  Genitourinary: Testes normal and penis normal. Right testis shows no mass, no swelling and no tenderness. Right testis is descended. Cremasteric reflex is not absent on the right side. Left testis shows no mass, no swelling and no tenderness. Left testis is descended. Cremasteric  reflex is not absent on the left side. Circumcised. No phimosis, paraphimosis, hypospadias, penile erythema or penile tenderness. No discharge found.  Musculoskeletal: Normal range of motion. He exhibits no edema, tenderness or deformity.  Lymphadenopathy:    He has no cervical adenopathy.  Neurological: He is alert and oriented to person, place, and time. He displays normal reflexes. No cranial nerve deficit or sensory deficit. He exhibits normal muscle tone. Coordination normal.  Skin: Skin is warm, dry and intact. Capillary refill takes less than 2 seconds. No rash noted. He is not diaphoretic. No erythema. No pallor.  Psychiatric: He has a normal mood and affect. His speech is normal and behavior is normal. Judgment and thought content normal. Cognition and memory are normal.  Nursing note and vitals reviewed.   Results for orders placed or performed in visit on 09/09/17  CBC with Differential/Platelet  Result Value Ref Range   WBC 9.1 3.4 - 10.8 x10E3/uL   RBC 5.41 4.14 - 5.80 x10E6/uL   Hemoglobin 16.7 13.0 - 17.7 g/dL   Hematocrit 50.8 37.5 - 51.0 %   MCV 94 79 - 97 fL   MCH 30.9 26.6 - 33.0 pg   MCHC 32.9 31.5 - 35.7 g/dL   RDW 13.8 12.3 - 15.4 %   Platelets 176 150 - 379 x10E3/uL   Neutrophils 66 Not Estab. %   Lymphs 26 Not Estab. %   Monocytes 6 Not Estab. %   Eos 2 Not Estab. %   Basos 0 Not Estab. %   Neutrophils Absolute 6.0 1.4 - 7.0 x10E3/uL   Lymphocytes Absolute 2.4 0.7 - 3.1 x10E3/uL   Monocytes Absolute 0.6 0.1 - 0.9 x10E3/uL   EOS (ABSOLUTE) 0.2 0.0 - 0.4 x10E3/uL   Basophils Absolute 0.0 0.0 - 0.2 x10E3/uL   Immature Granulocytes 0 Not Estab. %   Immature Grans (Abs) 0.0 0.0 - 0.1 x10E3/uL  Bayer DCA Hb A1c Waived  Result Value Ref Range   HB A1C (BAYER DCA - WAIVED) 8.4 (H) <7.0 %  Comprehensive metabolic panel  Result Value Ref Range   Glucose 273 (H) 65 - 99 mg/dL   BUN 12 6 - 24 mg/dL  Creatinine, Ser 0.92 0.76 - 1.27 mg/dL   GFR calc non Af Amer  104 >59 mL/min/1.73   GFR calc Af Amer 120 >59 mL/min/1.73   BUN/Creatinine Ratio 13 9 - 20   Sodium 138 134 - 144 mmol/L   Potassium 3.4 (L) 3.5 - 5.2 mmol/L   Chloride 98 96 - 106 mmol/L   CO2 19 (L) 20 - 29 mmol/L   Calcium 9.0 8.7 - 10.2 mg/dL   Total Protein 6.7 6.0 - 8.5 g/dL   Albumin 4.2 3.5 - 5.5 g/dL   Globulin, Total 2.5 1.5 - 4.5 g/dL   Albumin/Globulin Ratio 1.7 1.2 - 2.2   Bilirubin Total 0.6 0.0 - 1.2 mg/dL   Alkaline Phosphatase 82 39 - 117 IU/L   AST 15 0 - 40 IU/L   ALT 22 0 - 44 IU/L  Lipid Panel w/o Chol/HDL Ratio  Result Value Ref Range   Cholesterol, Total 187 100 - 199 mg/dL   Triglycerides 446 (H) 0 - 149 mg/dL   HDL 31 (L) >39 mg/dL   VLDL Cholesterol Cal Comment 5 - 40 mg/dL   LDL Calculated Comment 0 - 99 mg/dL  Microalbumin, Urine Waived  Result Value Ref Range   Microalb, Ur Waived 30 (H) 0 - 19 mg/L   Creatinine, Urine Waived 200 10 - 300 mg/dL   Microalb/Creat Ratio <30 <30 mg/g  TSH  Result Value Ref Range   TSH 0.588 0.450 - 4.500 uIU/mL  UA/M w/rflx Culture, Routine  Result Value Ref Range   Specific Gravity, UA 1.020 1.005 - 1.030   pH, UA 5.5 5.0 - 7.5   Color, UA Yellow Yellow   Appearance Ur Clear Clear   Leukocytes, UA Negative Negative   Protein, UA Negative Negative/Trace   Glucose, UA 3+ (A) Negative   Ketones, UA Trace (A) Negative   RBC, UA Negative Negative   Bilirubin, UA Negative Negative   Urobilinogen, Ur 0.2 0.2 - 1.0 mg/dL   Nitrite, UA Negative Negative      Assessment & Plan:   Problem List Items Addressed This Visit      Endocrine   Diabetes mellitus without complication ([facility])    N5A up to 9.2. Really work on taking his medicine. Watch diet. Continue current regimen. Continue to monitor.       Relevant Orders   Bayer DCA Hb A1c Waived   CBC with Differential/Platelet   Comprehensive metabolic panel   TSH   UA/M w/rflx Culture, Routine   Ambulatory referral to Ophthalmology     Genitourinary   Benign  hypertensive renal disease    Under good control. Continue to monitor. Call with any concerns.       Relevant Orders   CBC with Differential/Platelet   Comprehensive metabolic panel   Microalbumin, Urine Waived   TSH   UA/M w/rflx Culture, Routine     Other   Hyperlipidemia    Rechecking levels today.  Continue to monitor. Call with any concerns.       Relevant Orders   CBC with Differential/Platelet   Comprehensive metabolic panel   Lipid Panel w/o Chol/HDL Ratio   TSH   UA/M w/rflx Culture, Routine   Depression    Having stress at work. Continue to monitor. Call with any concerns.       Relevant Orders   CBC with Differential/Platelet   Comprehensive metabolic panel   TSH   UA/M w/rflx Culture, Routine    Other Visit Diagnoses  Routine general medical examination at a health care facility    -  Primary   Screening labs checked today. Continue diet and exercise. Continue to monitor. Call with any concerns.       LABORATORY TESTING:  Health maintenance labs ordered today as discussed above.   IMMUNIZATIONS:   - Tdap: Tetanus vaccination status reviewed: Offered and accepted, but forgot to give it. Will get next visit.  - Influenza: Postponed to flu season - Pneumovax: Up to date - Prevnar: Not applicable  PATIENT COUNSELING:    Sexuality: Discussed sexually transmitted diseases, partner selection, use of condoms, avoidance of unintended pregnancy  and contraceptive alternatives.   Advised to avoid cigarette smoking.  I discussed with the patient that most people either abstain from alcohol or drink within safe limits (<=14/week and <=4 drinks/occasion for males, <=7/weeks and <= 3 drinks/occasion for females) and that the risk for alcohol disorders and other health effects rises proportionally with the number of drinks per week and how often a drinker exceeds daily limits.  Discussed cessation/primary prevention of drug use and availability of treatment for  abuse.   Diet: Encouraged to adjust caloric intake to maintain  or achieve ideal body weight, to reduce intake of dietary saturated fat and total fat, to limit sodium intake by avoiding high sodium foods and not adding table salt, and to maintain adequate dietary potassium and calcium preferably from fresh fruits, vegetables, and low-fat dairy products.    stressed the importance of regular exercise  Injury prevention: Discussed safety belts, safety helmets, smoke detector, smoking near bedding or upholstery.   Dental health: Discussed importance of regular tooth brushing, flossing, and dental visits.   Follow up plan: NEXT PREVENTATIVE PHYSICAL DUE IN 1 YEAR. Return in about 3 months (around 01/30/2018).

## 2017-10-31 LAB — COMPREHENSIVE METABOLIC PANEL
ALBUMIN: 4.6 g/dL (ref 3.5–5.5)
ALK PHOS: 98 IU/L (ref 39–117)
ALT: 31 IU/L (ref 0–44)
AST: 19 IU/L (ref 0–40)
Albumin/Globulin Ratio: 1.7 (ref 1.2–2.2)
BILIRUBIN TOTAL: 0.8 mg/dL (ref 0.0–1.2)
BUN / CREAT RATIO: 11 (ref 9–20)
BUN: 10 mg/dL (ref 6–24)
CHLORIDE: 98 mmol/L (ref 96–106)
CO2: 24 mmol/L (ref 20–29)
Calcium: 9.4 mg/dL (ref 8.7–10.2)
Creatinine, Ser: 0.95 mg/dL (ref 0.76–1.27)
GFR calc Af Amer: 114 mL/min/{1.73_m2} (ref >59)
GFR calc non Af Amer: 99 mL/min/{1.73_m2} (ref >59)
GLUCOSE: 164 mg/dL — AB (ref 65–99)
Globulin, Total: 2.7 g/dL (ref 1.5–4.5)
Potassium: 4.1 mmol/L (ref 3.5–5.2)
Sodium: 140 mmol/L (ref 134–144)
Total Protein: 7.3 g/dL (ref 6.0–8.5)

## 2017-10-31 LAB — CBC WITH DIFFERENTIAL/PLATELET
BASOS ABS: 0 10*3/uL (ref 0.0–0.2)
Basos: 0 %
EOS (ABSOLUTE): 0.2 10*3/uL (ref 0.0–0.4)
Eos: 2 %
HEMOGLOBIN: 18.1 g/dL — AB (ref 13.0–17.7)
Hematocrit: 52.2 % — ABNORMAL HIGH (ref 37.5–51.0)
Immature Grans (Abs): 0 10*3/uL (ref 0.0–0.1)
Immature Granulocytes: 0 %
LYMPHS ABS: 2.4 10*3/uL (ref 0.7–3.1)
Lymphs: 23 %
MCH: 32.1 pg (ref 26.6–33.0)
MCHC: 34.7 g/dL (ref 31.5–35.7)
MCV: 93 fL (ref 79–97)
MONOCYTES: 4 %
MONOS ABS: 0.5 10*3/uL (ref 0.1–0.9)
NEUTROS ABS: 7.6 10*3/uL — AB (ref 1.4–7.0)
Neutrophils: 71 %
Platelets: 166 10*3/uL (ref 150–450)
RBC: 5.63 x10E6/uL (ref 4.14–5.80)
RDW: 13.9 % (ref 12.3–15.4)
WBC: 10.8 10*3/uL (ref 3.4–10.8)

## 2017-10-31 LAB — LIPID PANEL W/O CHOL/HDL RATIO
CHOLESTEROL TOTAL: 228 mg/dL — AB (ref 100–199)
HDL: 38 mg/dL — ABNORMAL LOW (ref >39)
LDL Calculated: 135 mg/dL — ABNORMAL HIGH (ref 0–99)
Triglycerides: 275 mg/dL — ABNORMAL HIGH (ref 0–149)
VLDL Cholesterol Cal: 55 mg/dL — ABNORMAL HIGH (ref 5–40)

## 2017-10-31 LAB — TSH: TSH: 0.649 u[IU]/mL (ref 0.450–4.500)

## 2017-11-20 DIAGNOSIS — L400 Psoriasis vulgaris: Secondary | ICD-10-CM | POA: Diagnosis not present

## 2017-11-20 DIAGNOSIS — Z79899 Other long term (current) drug therapy: Secondary | ICD-10-CM | POA: Diagnosis not present

## 2017-12-10 ENCOUNTER — Ambulatory Visit: Payer: Commercial Managed Care - PPO | Admitting: Family Medicine

## 2017-12-11 ENCOUNTER — Ambulatory Visit: Payer: Commercial Managed Care - PPO | Admitting: Family Medicine

## 2017-12-11 DIAGNOSIS — H02889 Meibomian gland dysfunction of unspecified eye, unspecified eyelid: Secondary | ICD-10-CM | POA: Diagnosis not present

## 2017-12-11 DIAGNOSIS — E119 Type 2 diabetes mellitus without complications: Secondary | ICD-10-CM | POA: Diagnosis not present

## 2017-12-11 LAB — HM DIABETES EYE EXAM

## 2018-01-12 ENCOUNTER — Ambulatory Visit: Payer: Commercial Managed Care - PPO | Admitting: Family Medicine

## 2018-01-15 ENCOUNTER — Other Ambulatory Visit: Payer: Self-pay

## 2018-01-15 ENCOUNTER — Encounter: Payer: Self-pay | Admitting: Family Medicine

## 2018-01-15 ENCOUNTER — Ambulatory Visit: Payer: Commercial Managed Care - PPO | Admitting: Family Medicine

## 2018-01-15 VITALS — BP 116/83 | HR 111 | Temp 98.2°F | Ht 69.4 in | Wt 250.0 lb

## 2018-01-15 DIAGNOSIS — J40 Bronchitis, not specified as acute or chronic: Secondary | ICD-10-CM | POA: Diagnosis not present

## 2018-01-15 MED ORDER — ALBUTEROL SULFATE (2.5 MG/3ML) 0.083% IN NEBU
2.5000 mg | INHALATION_SOLUTION | Freq: Once | RESPIRATORY_TRACT | Status: AC
  Administered 2018-01-15: 2.5 mg via RESPIRATORY_TRACT

## 2018-01-15 MED ORDER — BENZONATATE 200 MG PO CAPS: 200.0000 mg | ORAL_CAPSULE | Freq: Three times a day (TID) | ORAL | 0 refills | Status: DC | PRN

## 2018-01-15 MED ORDER — TRIAMCINOLONE ACETONIDE 40 MG/ML IJ SUSP
40.0000 mg | Freq: Once | INTRAMUSCULAR | Status: AC
  Administered 2018-01-15: 40 mg via INTRAMUSCULAR

## 2018-01-15 MED ORDER — HYDROCOD POLST-CPM POLST ER 10-8 MG/5ML PO SUER: 5.0000 mL | Freq: Two times a day (BID) | ORAL | 0 refills | Status: DC | PRN

## 2018-01-15 NOTE — Progress Notes (Signed)
   BP 116/83   Pulse (!) 111   Temp 98.2 F (36.8 C) (Oral)   Ht 5' 9.4" (1.763 m)   Wt 250 lb (113.4 kg)   SpO2 95%   BMI 36.49 kg/m    Subjective:    Patient ID: Casey Cole, male    DOB: 1975-11-01, 42 y.o.   MRN: [id number]  HPI: Casey Cole is a 42 y.o. male  Chief Complaint  Patient presents with  . Nasal Congestion    x 3 days but started feeling worse yesterday per pt/ have taken advil this morning  . Cough  . Headache   3 days of congestion, fatigue, low grade fevers, productive cough, HAs. Taking advil with no relief. Several family members with similar sxs. Denies CP, SOB, N/V/D. Current smoker, no known asthma or COPD.   Relevant past medical, surgical, family and social history reviewed and updated as indicated. Interim medical history since our last visit reviewed. Allergies and medications reviewed and updated.  Review of Systems  Per HPI unless specifically indicated above     Objective:    BP 116/83   Pulse (!) 111   Temp 98.2 F (36.8 C) (Oral)   Ht 5' 9.4" (1.763 m)   Wt 250 lb (113.4 kg)   SpO2 95%   BMI 36.49 kg/m   Wt Readings from Last 3 Encounters:  01/15/18 250 lb (113.4 kg)  10/30/17 249 lb 8 oz (113.2 kg)  09/09/17 255 lb 1 oz (115.7 kg)    Physical Exam  Constitutional: He is oriented to person, place, and time. He appears well-developed and well-nourished. No distress.  HENT:  Head: Atraumatic.  Right Ear: External ear normal.  Left Ear: External ear normal.  Oropharynx injected. Nasal mucosa erythematous with drainage present  Eyes: Conjunctivae and EOM are normal.  Neck: Normal range of motion. Neck supple.  Cardiovascular: Normal rate, regular rhythm and normal heart sounds.  Pulmonary/Chest: Effort normal. He has wheezes (mild diffuse). He has no rales.  Musculoskeletal: Normal range of motion.  Neurological: He is alert and oriented to person, place, and time.  Skin: Skin is warm and dry.  Psychiatric: He has a normal  mood and affect. His behavior is normal.  Nursing note and vitals reviewed.   Results for orders placed or performed in visit on 12/15/17  HM DIABETES EYE EXAM  Result Value Ref Range   HM Diabetic Eye Exam No Retinopathy No Retinopathy      Assessment & Plan:   Problem List Items Addressed This Visit    None    Visit Diagnoses    Bronchitis    -  Primary   IM kenalog and albuterol neb tx given in office. Tessalon, tussionex, and OTC supportive medications given. F/u if worsening or not improving   Relevant Medications   triamcinolone acetonide (KENALOG-40) injection 40 mg (Completed)   albuterol (PROVENTIL) (2.5 MG/3ML) 0.083% nebulizer solution 2.5 mg (Completed)       Follow up plan: Return if symptoms worsen or fail to improve.

## 2018-01-20 NOTE — Patient Instructions (Signed)
Follow up as needed

## 2018-02-03 ENCOUNTER — Other Ambulatory Visit: Payer: Self-pay

## 2018-02-03 ENCOUNTER — Encounter: Payer: Self-pay | Admitting: Family Medicine

## 2018-02-03 ENCOUNTER — Ambulatory Visit: Payer: Commercial Managed Care - PPO | Admitting: Family Medicine

## 2018-02-03 VITALS — BP 135/86 | HR 98 | Temp 98.0°F | Ht 69.4 in | Wt 251.0 lb

## 2018-02-03 DIAGNOSIS — E119 Type 2 diabetes mellitus without complications: Secondary | ICD-10-CM | POA: Diagnosis not present

## 2018-02-03 DIAGNOSIS — J40 Bronchitis, not specified as acute or chronic: Secondary | ICD-10-CM

## 2018-02-03 MED ORDER — SEMAGLUTIDE(0.25 OR 0.5MG/DOS) 2 MG/1.5ML ~~LOC~~ SOPN: 0.2500 mg | PEN_INJECTOR | SUBCUTANEOUS | 3 refills | Status: DC

## 2018-02-03 MED ORDER — DOXYCYCLINE HYCLATE 100 MG PO TABS: 100.0000 mg | ORAL_TABLET | Freq: Two times a day (BID) | ORAL | 0 refills | Status: DC

## 2018-02-03 NOTE — Progress Notes (Signed)
BP 135/86   Pulse 98   Temp 98 F (36.7 C) (Oral)   Ht 5' 9.4" (1.763 m)   Wt 251 lb (113.9 kg)   SpO2 97%   BMI 36.64 kg/m    Subjective:    Patient ID: Casey Cole, male    DOB: [birth year], 42 y.o.   MRN: [id number]  HPI: Casey Cole is a 42 y.o. male  Chief Complaint  Patient presents with  . Follow-up  . Diabetes   DIABETES Hypoglycemic episodes:no Polydipsia/polyuria: no Visual disturbance: no Chest pain: no Paresthesias: no Glucose Monitoring: yes  Accucheck frequency: Daily  Fasting glucose: 150-160 Taking Insulin?: no Blood Pressure Monitoring: not checking Retinal Examination: Up to Date Foot Exam: Up to Date Diabetic Education: Completed Pneumovax: Up to Date Influenza: Up to Date Aspirin: no  Relevant past medical, surgical, family and social history reviewed and updated as indicated. Interim medical history since our last visit reviewed. Allergies and medications reviewed and updated.  Review of Systems  Constitutional: Negative.   Respiratory: Negative.   Cardiovascular: Negative.   Psychiatric/Behavioral: Negative.     Per HPI unless specifically indicated above     Objective:    BP 135/86   Pulse 98   Temp 98 F (36.7 C) (Oral)   Ht 5' 9.4" (1.763 m)   Wt 251 lb (113.9 kg)   SpO2 97%   BMI 36.64 kg/m   Wt Readings from Last 3 Encounters:  02/03/18 251 lb (113.9 kg)  01/15/18 250 lb (113.4 kg)  10/30/17 249 lb 8 oz (113.2 kg)    Physical Exam  Constitutional: He is oriented to person, place, and time. He appears well-developed and well-nourished. No distress.  HENT:  Head: Normocephalic and atraumatic.  Right Ear: Hearing normal.  Left Ear: Hearing normal.  Nose: Nose normal.  Eyes: Conjunctivae and lids are normal. Right eye exhibits no discharge. Left eye exhibits no discharge. No scleral icterus.  Cardiovascular: Normal rate, regular rhythm, normal heart sounds and intact distal pulses. Exam reveals no gallop and no  friction rub.  No murmur heard. Pulmonary/Chest: Effort normal. No stridor. No respiratory distress. He has wheezes. He has no rales. He exhibits no tenderness.  Musculoskeletal: Normal range of motion.  Neurological: He is alert and oriented to person, place, and time.  Skin: Skin is intact. No rash noted.  Psychiatric: He has a normal mood and affect. His speech is normal and behavior is normal. Judgment and thought content normal. Cognition and memory are normal.  Nursing note and vitals reviewed.   Results for orders placed or performed in visit on 12/15/17  HM DIABETES EYE EXAM  Result Value Ref Range   HM Diabetic Eye Exam No Retinopathy No Retinopathy      Assessment & Plan:   Problem List Items Addressed This Visit      Endocrine   Diabetes mellitus without complication ([place]) - Primary    Not under good control with A1c too high at 9.0. Will add ozempic and recheck tolerance in 1 month. Call with any concerns.       Relevant Medications   Semaglutide,0.25 or 0.5MG /DOS, (OZEMPIC, 0.25 OR 0.5 MG/DOSE,) 2 MG/1.5ML SOPN   Other Relevant Orders   Bayer DCA Hb A1c Waived    Other Visit Diagnoses    Bronchitis       Will treat with doxycycline. Call if not getting better or getting worse.         Follow up plan: Return in  about 4 weeks (around 03/03/2018).

## 2018-02-03 NOTE — Assessment & Plan Note (Signed)
Not under good control with A1c too high at 9.0. Will add ozempic and recheck tolerance in 1 month. Call with any concerns.

## 2018-02-04 LAB — BAYER DCA HB A1C WAIVED: HB A1C (BAYER DCA - WAIVED): 9 % — ABNORMAL HIGH (ref <7.0)

## 2018-02-05 ENCOUNTER — Telehealth: Payer: Self-pay

## 2018-02-05 NOTE — Telephone Encounter (Signed)
Approved. PA Case ID: [id number]

## 2018-02-05 NOTE — Telephone Encounter (Signed)
Prior authorization for xigduo XR 09-998 MG was initiated via [email or web address]. Key: [id number]

## 2018-02-09 ENCOUNTER — Other Ambulatory Visit: Payer: Self-pay | Admitting: Family Medicine

## 2018-02-10 NOTE — Telephone Encounter (Signed)
Requested Prescriptions  Pending Prescriptions Disp Refills  . ONE TOUCH ULTRA TEST test strip [Pharmacy Med Name: ONE TOUCH ULTRA BLUE TEST STR 25'S] 100 each 0    Sig: USE AS DIRECTED ONCE DAILY     There is no refill protocol information for this order

## 2018-02-26 DIAGNOSIS — D485 Neoplasm of uncertain behavior of skin: Secondary | ICD-10-CM | POA: Diagnosis not present

## 2018-02-26 DIAGNOSIS — L400 Psoriasis vulgaris: Secondary | ICD-10-CM | POA: Diagnosis not present

## 2018-03-10 ENCOUNTER — Encounter: Payer: Self-pay | Admitting: Family Medicine

## 2018-03-10 ENCOUNTER — Ambulatory Visit: Payer: Commercial Managed Care - PPO | Admitting: Family Medicine

## 2018-03-10 VITALS — BP 127/88 | HR 127 | Temp 98.3°F | Wt 234.0 lb

## 2018-03-10 DIAGNOSIS — E119 Type 2 diabetes mellitus without complications: Secondary | ICD-10-CM | POA: Diagnosis not present

## 2018-03-10 NOTE — Progress Notes (Signed)
   BP 127/88   Pulse (!) 127   Temp 98.3 F (36.8 C) (Oral)   Wt 234 lb (106.1 kg)   SpO2 94%   BMI 34.16 kg/m    Subjective:    Patient ID: Casey Cole, male    DOB: Jun 11, 1975, 42 y.o.   MRN: [id number]  HPI: Casey Cole is a 42 y.o. male  Chief Complaint  Patient presents with  . Follow-up    Ozempic, Loss of appititae.    DIABETES Hypoglycemic episodes:no Polydipsia/polyuria: no Visual disturbance: no Chest pain: no Paresthesias: no Glucose Monitoring: yes  Accucheck frequency: Daily  Fasting glucose: 120s! Taking Insulin?: no Blood Pressure Monitoring: not checking Retinal Examination: Up to Date Foot Exam: Up to Date Diabetic Education: Completed Pneumovax: Up to Date Influenza: Up to Date Aspirin: no  Relevant past medical, surgical, family and social history reviewed and updated as indicated. Interim medical history since our last visit reviewed. Allergies and medications reviewed and updated.  Review of Systems  Constitutional: Negative.   Respiratory: Negative.   Cardiovascular: Negative.   Gastrointestinal: Negative.   Psychiatric/Behavioral: Negative.     Per HPI unless specifically indicated above     Objective:    BP 127/88   Pulse (!) 127   Temp 98.3 F (36.8 C) (Oral)   Wt 234 lb (106.1 kg)   SpO2 94%   BMI 34.16 kg/m   Wt Readings from Last 3 Encounters:  03/10/18 234 lb (106.1 kg)  02/03/18 251 lb (113.9 kg)  01/15/18 250 lb (113.4 kg)    Physical Exam  Constitutional: He is oriented to person, place, and time. He appears well-developed and well-nourished. No distress.  HENT:  Head: Normocephalic and atraumatic.  Right Ear: Hearing normal.  Left Ear: Hearing normal.  Nose: Nose normal.  Eyes: Conjunctivae and lids are normal. Right eye exhibits no discharge. Left eye exhibits no discharge. No scleral icterus.  Cardiovascular: Normal rate, regular rhythm, normal heart sounds and intact distal pulses. Exam reveals no gallop  and no friction rub.  No murmur heard. Pulmonary/Chest: Effort normal and breath sounds normal. No stridor. No respiratory distress. He has no wheezes. He has no rales. He exhibits no tenderness.  Musculoskeletal: Normal range of motion.  Neurological: He is alert and oriented to person, place, and time.  Skin: Skin is warm, dry and intact. Capillary refill takes less than 2 seconds. No rash noted. He is not diaphoretic. No erythema. No pallor.  Psychiatric: He has a normal mood and affect. His speech is normal and behavior is normal. Judgment and thought content normal. Cognition and memory are normal.  Nursing note and vitals reviewed.   Results for orders placed or performed in visit on 02/03/18  Bayer DCA Hb A1c Waived  Result Value Ref Range   HB A1C (BAYER DCA - WAIVED) 9.0 (H) <7.0 %      Assessment & Plan:   Problem List Items Addressed This Visit      Endocrine   Diabetes mellitus without complication ([place]) - Primary    Doing better with the ozempic. Will continue. Checking BMP today. Recheck a1c 2 months.       Relevant Orders   Basic metabolic panel       Follow up plan: Return in about 2 months (around 05/10/2018) for follow up DM.

## 2018-03-10 NOTE — Assessment & Plan Note (Signed)
Doing better with the ozempic. Will continue. Checking BMP today. Recheck a1c 2 months.

## 2018-03-11 LAB — BASIC METABOLIC PANEL
BUN/Creatinine Ratio: 11 (ref 9–20)
BUN: 10 mg/dL (ref 6–24)
CO2: 21 mmol/L (ref 20–29)
CREATININE: 0.93 mg/dL (ref 0.76–1.27)
Calcium: 9.3 mg/dL (ref 8.7–10.2)
Chloride: 99 mmol/L (ref 96–106)
GFR calc Af Amer: 117 mL/min/{1.73_m2} (ref >59)
GFR, EST NON AFRICAN AMERICAN: 102 mL/min/{1.73_m2} (ref >59)
Glucose: 130 mg/dL — ABNORMAL HIGH (ref 65–99)
Potassium: 3.8 mmol/L (ref 3.5–5.2)
SODIUM: 138 mmol/L (ref 134–144)

## 2018-05-12 ENCOUNTER — Ambulatory Visit: Payer: Commercial Managed Care - PPO | Admitting: Family Medicine

## 2018-05-12 ENCOUNTER — Encounter: Payer: Self-pay | Admitting: Family Medicine

## 2018-05-12 VITALS — BP 133/88 | HR 102 | Temp 98.2°F | Wt 250.0 lb

## 2018-05-12 DIAGNOSIS — E782 Mixed hyperlipidemia: Secondary | ICD-10-CM

## 2018-05-12 DIAGNOSIS — Z23 Encounter for immunization: Secondary | ICD-10-CM

## 2018-05-12 DIAGNOSIS — E119 Type 2 diabetes mellitus without complications: Secondary | ICD-10-CM | POA: Diagnosis not present

## 2018-05-12 DIAGNOSIS — I129 Hypertensive chronic kidney disease with stage 1 through stage 4 chronic kidney disease, or unspecified chronic kidney disease: Secondary | ICD-10-CM | POA: Diagnosis not present

## 2018-05-12 LAB — BAYER DCA HB A1C WAIVED: HB A1C: 7.6 % — AB (ref <7.0)

## 2018-05-12 MED ORDER — DAPAGLIFLOZIN PRO-METFORMIN ER 5-1000 MG PO TB24: 1.0000 | ORAL_TABLET | Freq: Two times a day (BID) | ORAL | 1 refills | Status: DC

## 2018-05-12 MED ORDER — ATORVASTATIN CALCIUM 20 MG PO TABS: 20.0000 mg | ORAL_TABLET | Freq: Every day | ORAL | 1 refills | Status: DC

## 2018-05-12 MED ORDER — SEMAGLUTIDE(0.25 OR 0.5MG/DOS) 2 MG/1.5ML ~~LOC~~ SOPN: 0.5000 mg | PEN_INJECTOR | SUBCUTANEOUS | 3 refills | Status: DC

## 2018-05-12 NOTE — Progress Notes (Signed)
BP 133/88   Pulse (!) 102   Temp 98.2 F (36.8 C) (Oral)   Wt 250 lb (113.4 kg)   SpO2 97%   BMI 36.49 kg/m    Subjective:    Patient ID: Casey Cole, male    DOB: [birth year], 43 y.o.   MRN: [id number]  HPI: Casey Cole is a 43 y.o. male  Chief Complaint  Patient presents with  . Follow-up  . Diabetes  . Hyperlipidemia   HYPERTENSION / HYPERLIPIDEMIA Satisfied with current treatment? yes Duration of hypertension: chronic BP monitoring frequency: not checking BP medication side effects: no Past BP meds: none Duration of hyperlipidemia: chronic Cholesterol medication side effects: no Cholesterol supplements: none Past cholesterol medications: none Medication compliance: excellent compliance Aspirin: no Recent stressors: no Recurrent headaches: no Visual changes: no Palpitations: no Dyspnea: no Chest pain: no Lower extremity edema: no Dizzy/lightheaded: no  DIABETES Hypoglycemic episodes:no Polydipsia/polyuria: no Visual disturbance: no Chest pain: no Paresthesias: no Glucose Monitoring: yes  Accucheck frequency: occasionally Taking Insulin?: no Blood Pressure Monitoring: not checking Retinal Examination: Up to Date Foot Exam: Up to Date Diabetic Education: Completed Pneumovax: Up to Date Influenza: Refused Aspirin: no   Relevant past medical, surgical, family and social history reviewed and updated as indicated. Interim medical history since our last visit reviewed. Allergies and medications reviewed and updated.  Review of Systems  Constitutional: Negative.   Respiratory: Negative.   Cardiovascular: Negative.   Musculoskeletal: Negative.   Psychiatric/Behavioral: Negative.     Per HPI unless specifically indicated above     Objective:    BP 133/88   Pulse (!) 102   Temp 98.2 F (36.8 C) (Oral)   Wt 250 lb (113.4 kg)   SpO2 97%   BMI 36.49 kg/m   Wt Readings from Last 3 Encounters:  05/12/18 250 lb (113.4 kg)  03/10/18 234 lb  (106.1 kg)  02/03/18 251 lb (113.9 kg)    Physical Exam Vitals signs and nursing note reviewed.  Constitutional:      General: He is not in acute distress.    Appearance: Normal appearance. He is not ill-appearing, toxic-appearing or diaphoretic.  HENT:     Head: Normocephalic and atraumatic.     Right Ear: External ear normal.     Left Ear: External ear normal.     Nose: Nose normal.     Mouth/Throat:     Mouth: Mucous membranes are moist.     Pharynx: Oropharynx is clear.  Eyes:     General: No scleral icterus.       Right eye: No discharge.        Left eye: No discharge.     Extraocular Movements: Extraocular movements intact.     Conjunctiva/sclera: Conjunctivae normal.     Pupils: Pupils are equal, round, and reactive to light.  Neck:     Musculoskeletal: Normal range of motion and neck supple.  Cardiovascular:     Rate and Rhythm: Normal rate and regular rhythm.     Pulses: Normal pulses.     Heart sounds: Normal heart sounds. No murmur. No friction rub. No gallop.   Pulmonary:     Effort: Pulmonary effort is normal. No respiratory distress.     Breath sounds: Normal breath sounds. No stridor. No wheezing, rhonchi or rales.  Chest:     Chest wall: No tenderness.  Musculoskeletal: Normal range of motion.  Skin:    General: Skin is warm and dry.     Capillary Refill:  Capillary refill takes less than 2 seconds.     Coloration: Skin is not jaundiced or pale.     Findings: No bruising, erythema, lesion or rash.  Neurological:     General: No focal deficit present.     Mental Status: He is alert and oriented to person, place, and time. Mental status is at baseline.  Psychiatric:        Mood and Affect: Mood normal.        Behavior: Behavior normal.        Thought Content: Thought content normal.        Judgment: Judgment normal.     Results for orders placed or performed in visit on 53/61/44  Basic metabolic panel  Result Value Ref Range   Glucose 130 (H) 65 - 99  mg/dL   BUN 10 6 - 24 mg/dL   Creatinine, Ser 0.93 0.76 - 1.27 mg/dL   GFR calc non Af Amer 102 >59 mL/min/1.73   GFR calc Af Amer 117 >59 mL/min/1.73   BUN/Creatinine Ratio 11 9 - 20   Sodium 138 134 - 144 mmol/L   Potassium 3.8 3.5 - 5.2 mmol/L   Chloride 99 96 - 106 mmol/L   CO2 21 20 - 29 mmol/L   Calcium 9.3 8.7 - 10.2 mg/dL      Assessment & Plan:   Problem List Items Addressed This Visit      Endocrine   Diabetes mellitus without complication ([place]) - Primary    Doing much better with A1c of 7.6 down from 9.1- will increase ozempic to 0.5mg  weekly and recheck in 3 months.       Relevant Medications   atorvastatin (LIPITOR) 20 MG tablet   Semaglutide,0.25 or 0.5MG /DOS, (OZEMPIC, 0.25 OR 0.5 MG/DOSE,) 2 MG/1.5ML SOPN   Dapagliflozin-metFORMIN HCl ER (XIGDUO XR) 09-998 MG TB24   Other Relevant Orders   Bayer DCA Hb A1c Waived   Comprehensive metabolic panel     Genitourinary   Benign hypertensive renal disease    Under good control on current regimen. Continue current regimen. Continue to monitor. Call with any concerns. Refills given. Labs checked today.       Relevant Orders   Comprehensive metabolic panel     Other   Hyperlipidemia    Will start atorvastatin. Recheck 3 months. Call with any concerns.       Relevant Medications   atorvastatin (LIPITOR) 20 MG tablet   Other Relevant Orders   Comprehensive metabolic panel   Lipid Panel w/o Chol/HDL Ratio    Other Visit Diagnoses    Need for Tdap vaccination       Tdap given today.   Relevant Orders   Tdap vaccine greater than or equal to 7yo IM (Completed)       Follow up plan: Return in about 3 months (around 08/11/2018) for DM follow up.

## 2018-05-12 NOTE — Assessment & Plan Note (Signed)
Doing much better with A1c of 7.6 down from 9.1- will increase ozempic to 0.5mg  weekly and recheck in 3 months.

## 2018-05-12 NOTE — Patient Instructions (Signed)
Tdap Vaccine (Tetanus, Diphtheria and Pertussis): What You Need to Know  1. Why get vaccinated?  Tetanus, diphtheria and pertussis are very serious diseases. Tdap vaccine can protect us from these diseases. And, Tdap vaccine given to pregnant women can protect newborn babies against pertussis..  TETANUS (Lockjaw) is rare in the [place] today. It causes painful muscle tightening and stiffness, usually all over the body.  · It can lead to tightening of muscles in the head and neck so you can't open your mouth, swallow, or sometimes even breathe. Tetanus kills about 1 out of 10 people who are infected even after receiving the best medical care.  DIPHTHERIA is also rare in the [place] today. It can cause a thick coating to form in the back of the throat.  · It can lead to breathing problems, heart failure, paralysis, and death.  PERTUSSIS (Whooping Cough) causes severe coughing spells, which can cause difficulty breathing, vomiting and disturbed sleep.  · It can also lead to weight loss, incontinence, and rib fractures. Up to 2 in 100 adolescents and 5 in 100 adults with pertussis are hospitalized or have complications, which could include pneumonia or death.  These diseases are caused by bacteria. Diphtheria and pertussis are spread from person to person through secretions from coughing or sneezing. Tetanus enters the body through cuts, scratches, or wounds.  Before vaccines, as many as 200,000 cases of diphtheria, 200,000 cases of pertussis, and hundreds of cases of tetanus, were reported in the [place] each year. Since vaccination began, reports of cases for tetanus and diphtheria have dropped by about 99% and for pertussis by about 80%.  2. Tdap vaccine  Tdap vaccine can protect adolescents and adults from tetanus, diphtheria, and pertussis. One dose of Tdap is routinely given at age 11 or 12. People who did not get Tdap at that age should get it as soon as possible.  Tdap is especially important  for healthcare professionals and anyone having close contact with a baby younger than 12 months.  Pregnant women should get a dose of Tdap during every pregnancy, to protect the newborn from pertussis. Infants are most at risk for severe, life-threatening complications from pertussis.  Another vaccine, called Td, protects against tetanus and diphtheria, but not pertussis. A Td booster should be given every 10 years. Tdap may be given as one of these boosters if you have never gotten Tdap before. Tdap may also be given after a severe cut or burn to prevent tetanus infection.  Your doctor or the person giving you the vaccine can give you more information.  Tdap may safely be given at the same time as other vaccines.  3. Some people should not get this vaccine  · A person who has ever had a life-threatening allergic reaction after a previous dose of any diphtheria, tetanus or pertussis containing vaccine, OR has a severe allergy to any part of this vaccine, should not get Tdap vaccine. Tell the person giving the vaccine about any severe allergies.  · Anyone who had coma or long repeated seizures within 7 days after a childhood dose of DTP or DTaP, or a previous dose of Tdap, should not get Tdap, unless a cause other than the vaccine was found. They can still get Td.  · Talk to your doctor if you:  ? have seizures or another nervous system problem,  ? had severe pain or swelling after any vaccine containing diphtheria, tetanus or pertussis,  ? ever had a condition   called Guillain-Barré Syndrome (GBS),  ? aren't feeling well on the day the shot is scheduled.  4. Risks  With any medicine, including vaccines, there is a chance of side effects. These are usually mild and go away on their own. Serious reactions are also possible but are rare.  Most people who get Tdap vaccine do not have any problems with it.  Mild problems following Tdap  (Did not interfere with activities)  · Pain where the shot was given (about 3 in 4  adolescents or 2 in 3 adults)  · Redness or swelling where the shot was given (about 1 person in 5)  · Mild fever of at least 100.4°F (up to about 1 in 25 adolescents or 1 in 100 adults)  · Headache (about 3 or 4 people in 10)  · Tiredness (about 1 person in 3 or 4)  · Nausea, vomiting, diarrhea, stomach ache (up to 1 in 4 adolescents or 1 in 10 adults)  · Chills, sore joints (about 1 person in 10)  · Body aches (about 1 person in 3 or 4)  · Rash, swollen glands (uncommon)  Moderate problems following Tdap  (Interfered with activities, but did not require medical attention)  · Pain where the shot was given (up to 1 in 5 or 6)  · Redness or swelling where the shot was given (up to about 1 in 16 adolescents or 1 in 12 adults)  · Fever over 102°F (about 1 in 100 adolescents or 1 in 250 adults)  · Headache (about 1 in 7 adolescents or 1 in 10 adults)  · Nausea, vomiting, diarrhea, stomach ache (up to 1 or 3 people in 100)  · Swelling of the entire arm where the shot was given (up to about 1 in 500).  Severe problems following Tdap  (Unable to perform usual activities; required medical attention)  · Swelling, severe pain, bleeding and redness in the arm where the shot was given (rare).  Problems that could happen after any vaccine:  · People sometimes faint after a medical procedure, including vaccination. Sitting or lying down for about 15 minutes can help prevent fainting, and injuries caused by a fall. Tell your doctor if you feel dizzy, or have vision changes or ringing in the ears.  · Some people get severe pain in the shoulder and have difficulty moving the arm where a shot was given. This happens very rarely.  · Any medication can cause a severe allergic reaction. Such reactions from a vaccine are very rare, estimated at fewer than 1 in a million doses, and would happen within a few minutes to a few hours after the vaccination.  As with any medicine, there is a very remote chance of a vaccine causing a serious  injury or death.  The safety of vaccines is always being monitored. For more information, visit: www.cdc.gov/vaccinesafety/  5. What if there is a serious problem?  What should I look for?  · Look for anything that concerns you, such as signs of a severe allergic reaction, very high fever, or unusual behavior.  Signs of a severe allergic reaction can include hives, swelling of the face and throat, difficulty breathing, a fast heartbeat, dizziness, and weakness. These would usually start a few minutes to a few hours after the vaccination.  What should I do?  · If you think it is a severe allergic reaction or other emergency that can't wait, call 9-1-1 or get the person to the nearest hospital. Otherwise,   call your doctor.  · Afterward, the reaction should be reported to the Vaccine Adverse Event Reporting System (VAERS). Your doctor might file this report, or you can do it yourself through the VAERS web site at www.vaers.hhs.gov, or by calling 1-800-822-7967.  VAERS does not give medical advice.  6. The National Vaccine Injury Compensation Program  The National Vaccine Injury Compensation Program (VICP) is a federal program that was created to compensate people who may have been injured by certain vaccines.  Persons who believe they may have been injured by a vaccine can learn about the program and about filing a claim by calling 1-800-338-2382 or visiting the VICP website at www.hrsa.gov/vaccinecompensation. There is a time limit to file a claim for compensation.  7. How can I learn more?  · Ask your doctor. He or she can give you the vaccine package insert or suggest other sources of information.  · Call your local or state health department.  · Contact the Centers for Disease Control and Prevention (CDC):  ? Call 1-800-232-4636 (1-800-CDC-INFO) or  ? Visit CDC's website at www.cdc.gov/vaccines  Vaccine Information Statement Tdap Vaccine (06/29/2013)  This information is not intended to replace advice given to you  by your health care provider. Make sure you discuss any questions you have with your health care provider.  Document Released: 10/22/2011 Document Revised: 12/08/2017 Document Reviewed: 12/08/2017  Elsevier Interactive Patient Education © 2019 [employer]

## 2018-05-12 NOTE — Assessment & Plan Note (Signed)
Under good control on current regimen. Continue current regimen. Continue to monitor. Call with any concerns. Refills given. Labs checked today.  

## 2018-05-12 NOTE — Assessment & Plan Note (Signed)
Will start atorvastatin. Recheck 3 months. Call with any concerns.

## 2018-05-13 LAB — COMPREHENSIVE METABOLIC PANEL
ALT: 23 IU/L (ref 0–44)
AST: 17 IU/L (ref 0–40)
Albumin/Globulin Ratio: 1.9 (ref 1.2–2.2)
Albumin: 4.5 g/dL (ref 3.5–5.5)
Alkaline Phosphatase: 75 IU/L (ref 39–117)
BILIRUBIN TOTAL: 0.7 mg/dL (ref 0.0–1.2)
BUN/Creatinine Ratio: 9 (ref 9–20)
BUN: 10 mg/dL (ref 6–24)
CO2: 23 mmol/L (ref 20–29)
CREATININE: 1.17 mg/dL (ref 0.76–1.27)
Calcium: 9.2 mg/dL (ref 8.7–10.2)
Chloride: 98 mmol/L (ref 96–106)
GFR calc non Af Amer: 77 mL/min/{1.73_m2} (ref >59)
GFR, EST AFRICAN AMERICAN: 89 mL/min/{1.73_m2} (ref >59)
Globulin, Total: 2.4 g/dL (ref 1.5–4.5)
Glucose: 118 mg/dL — ABNORMAL HIGH (ref 65–99)
POTASSIUM: 3.9 mmol/L (ref 3.5–5.2)
SODIUM: 138 mmol/L (ref 134–144)
Total Protein: 6.9 g/dL (ref 6.0–8.5)

## 2018-05-13 LAB — LIPID PANEL W/O CHOL/HDL RATIO
Cholesterol, Total: 210 mg/dL — ABNORMAL HIGH (ref 100–199)
HDL: 32 mg/dL — AB (ref >39)
Triglycerides: 460 mg/dL — ABNORMAL HIGH (ref 0–149)

## 2018-05-22 ENCOUNTER — Encounter: Payer: Self-pay | Admitting: Family Medicine

## 2018-05-22 ENCOUNTER — Other Ambulatory Visit: Payer: Self-pay

## 2018-05-22 ENCOUNTER — Ambulatory Visit: Payer: Commercial Managed Care - PPO | Admitting: Family Medicine

## 2018-05-22 VITALS — BP 135/89 | HR 98 | Temp 98.6°F | Ht 69.0 in | Wt 246.0 lb

## 2018-05-22 DIAGNOSIS — M9908 Segmental and somatic dysfunction of rib cage: Secondary | ICD-10-CM

## 2018-05-22 DIAGNOSIS — M546 Pain in thoracic spine: Secondary | ICD-10-CM | POA: Diagnosis not present

## 2018-05-22 MED ORDER — NAPROXEN 500 MG PO TABS: 500.0000 mg | ORAL_TABLET | Freq: Two times a day (BID) | ORAL | 0 refills | Status: DC

## 2018-05-22 MED ORDER — CYCLOBENZAPRINE HCL 10 MG PO TABS: 10.0000 mg | ORAL_TABLET | Freq: Every day | ORAL | 0 refills | Status: DC

## 2018-05-22 NOTE — Progress Notes (Signed)
BP 135/89   Pulse 98   Temp 98.6 F (37 C) (Oral)   Ht 5\' 9"  (1.753 m)   Wt 246 lb (111.6 kg)   SpO2 96%   BMI 36.33 kg/m    Subjective:    Patient ID: Casey Cole, male    DOB: October 15, 1975, 43 y.o.   MRN: [id number]  HPI: Casey Cole is a 43 y.o. male  Chief Complaint  Patient presents with  . Back Pain    middle   . Shoulder Pain    left shoulder blade since  this past Monday   BACK PAIN Duration: 5 days Mechanism of injury: unknown Location: Left and upper back Onset: sudden Severity: severe Quality: aching Frequency: constant Radiation: none Aggravating factors: movement Alleviating factors: rest and laying Status: stable Treatments attempted: rest, ice, heat, APAP, ibuprofen and aleve  Relief with NSAIDs?: no Nighttime pain:  yes Paresthesias / decreased sensation:  no Bowel / bladder incontinence:  no Fevers:  no Dysuria / urinary frequency:  no  Relevant past medical, surgical, family and social history reviewed and updated as indicated. Interim medical history since our last visit reviewed. Allergies and medications reviewed and updated.  Review of Systems  Constitutional: Negative.   Respiratory: Negative.   Cardiovascular: Negative.   Musculoskeletal: Positive for back pain and myalgias. Negative for arthralgias, gait problem, joint swelling, neck pain and neck stiffness.  Skin: Negative.   Psychiatric/Behavioral: Negative.     Per HPI unless specifically indicated above     Objective:    BP 135/89   Pulse 98   Temp 98.6 F (37 C) (Oral)   Ht 5\' 9"  (1.753 m)   Wt 246 lb (111.6 kg)   SpO2 96%   BMI 36.33 kg/m   Wt Readings from Last 3 Encounters:  05/22/18 246 lb (111.6 kg)  05/12/18 250 lb (113.4 kg)  03/10/18 234 lb (106.1 kg)    Physical Exam Vitals signs and nursing note reviewed.  Constitutional:      General: He is not in acute distress.    Appearance: Normal appearance. He is not ill-appearing, toxic-appearing or  diaphoretic.  HENT:     Head: Normocephalic and atraumatic.     Right Ear: External ear normal.     Left Ear: External ear normal.     Nose: Nose normal.     Mouth/Throat:     Mouth: Mucous membranes are moist.     Pharynx: Oropharynx is clear.  Eyes:     General: No scleral icterus.       Right eye: No discharge.        Left eye: No discharge.     Extraocular Movements: Extraocular movements intact.     Conjunctiva/sclera: Conjunctivae normal.     Pupils: Pupils are equal, round, and reactive to light.  Neck:     Musculoskeletal: Normal range of motion and neck supple.  Cardiovascular:     Rate and Rhythm: Normal rate and regular rhythm.     Pulses: Normal pulses.     Heart sounds: Normal heart sounds. No murmur. No friction rub. No gallop.   Pulmonary:     Effort: Pulmonary effort is normal. No respiratory distress.     Breath sounds: Normal breath sounds. No stridor. No wheezing, rhonchi or rales.  Chest:     Chest wall: No tenderness.  Skin:    General: Skin is warm and dry.     Capillary Refill: Capillary refill takes less than 2 seconds.  Coloration: Skin is not jaundiced or pale.     Findings: No bruising, erythema, lesion or rash.  Neurological:     General: No focal deficit present.     Mental Status: He is alert and oriented to person, place, and time. Mental status is at baseline.  Psychiatric:        Mood and Affect: Mood normal.        Behavior: Behavior normal.        Thought Content: Thought content normal.        Judgment: Judgment normal.   Musculoskeletal:  Exam found Decreased ROM, Tissue texture changes, Tenderness to palpation and Asymmetry of patient's  ribs Osteopathic Structural Exam:   Ribs: ribs 5-9 locked up on the L   Results for orders placed or performed in visit on 05/12/18  Bayer DCA Hb A1c Waived  Result Value Ref Range   HB A1C (BAYER DCA - WAIVED) 7.6 (H) <7.0 %  Comprehensive metabolic panel  Result Value Ref Range   Glucose  118 (H) 65 - 99 mg/dL   BUN 10 6 - 24 mg/dL   Creatinine, Ser 1.17 0.76 - 1.27 mg/dL   GFR calc non Af Amer 77 >59 mL/min/1.73   GFR calc Af Amer 89 >59 mL/min/1.73   BUN/Creatinine Ratio 9 9 - 20   Sodium 138 134 - 144 mmol/L   Potassium 3.9 3.5 - 5.2 mmol/L   Chloride 98 96 - 106 mmol/L   CO2 23 20 - 29 mmol/L   Calcium 9.2 8.7 - 10.2 mg/dL   Total Protein 6.9 6.0 - 8.5 g/dL   Albumin 4.5 3.5 - 5.5 g/dL   Globulin, Total 2.4 1.5 - 4.5 g/dL   Albumin/Globulin Ratio 1.9 1.2 - 2.2   Bilirubin Total 0.7 0.0 - 1.2 mg/dL   Alkaline Phosphatase 75 39 - 117 IU/L   AST 17 0 - 40 IU/L   ALT 23 0 - 44 IU/L  Lipid Panel w/o Chol/HDL Ratio  Result Value Ref Range   Cholesterol, Total 210 (H) 100 - 199 mg/dL   Triglycerides 460 (H) 0 - 149 mg/dL   HDL 32 (L) >39 mg/dL   VLDL Cholesterol Cal Comment 5 - 40 mg/dL   LDL Calculated Comment 0 - 99 mg/dL      Assessment & Plan:   Problem List Items Addressed This Visit    None    Visit Diagnoses    Acute left-sided thoracic back pain    -  Primary   Will treat with flexeril and naproxen. He does have somatic dysfunction that is contributing to his symptoms. Treated today with good results as below.    Relevant Medications   cyclobenzaprine (FLEXERIL) 10 MG tablet   naproxen (NAPROSYN) 500 MG tablet   Segmental dysfunction of rib cage         After verbal consent was obtained, patient was treated today with osteopathic manipulative medicine to the regions of the ribs using the techniques of myofascial release, muscle energy and HVLA. Areas of compensation relating to his primary pain source also treated. Patient tolerated the procedure well with good objective and good subjective improvement in symptoms. He left the room in good condition. He was advised to stay well hydrated and that he may have some soreness following the procedure. If not improving or worsening, he will call and come in. He will return for reevaluation  on a PRN  basis.   Follow up plan: Return if symptoms worsen or  fail to improve.

## 2018-05-29 ENCOUNTER — Ambulatory Visit: Payer: Commercial Managed Care - PPO | Admitting: Family Medicine

## 2018-05-29 ENCOUNTER — Encounter: Payer: Self-pay | Admitting: Family Medicine

## 2018-05-29 ENCOUNTER — Other Ambulatory Visit: Payer: Self-pay

## 2018-05-29 VITALS — BP 152/100 | HR 98 | Temp 98.6°F | Ht 69.0 in | Wt 248.0 lb

## 2018-05-29 DIAGNOSIS — R1013 Epigastric pain: Secondary | ICD-10-CM

## 2018-05-29 MED ORDER — SUCRALFATE 1 GM/10ML PO SUSP: 1.0000 g | Freq: Three times a day (TID) | ORAL | 0 refills | Status: DC

## 2018-05-29 MED ORDER — OMEPRAZOLE 40 MG PO CPDR: 40.0000 mg | DELAYED_RELEASE_CAPSULE | Freq: Every day | ORAL | 3 refills | Status: DC

## 2018-05-29 MED ORDER — ONDANSETRON 4 MG PO TBDP: 4.0000 mg | ORAL_TABLET | Freq: Three times a day (TID) | ORAL | 0 refills | Status: DC | PRN

## 2018-05-29 NOTE — Progress Notes (Signed)
BP (!) 152/100   Pulse 98   Temp 98.6 F (37 C) (Oral)   Ht 5\' 9"  (1.753 m)   Wt 248 lb (112.5 kg)   SpO2 97%   BMI 36.62 kg/m    Subjective:    Patient ID: Casey Cole, male    DOB: 01/05/1976, 43 y.o.   MRN: [id number]  HPI: Casey Cole is a 43 y.o. male  Chief Complaint  Patient presents with  . Back Pain     pt states pain gets worse after he eats  . Abdominal Pain  . Nausea   Not doing any better. Now with a lot of pain in his epigastric region that goes into his back. Worse when he eats. Nothing is making it better.  ABDOMINAL PAIN  Duration: 2 weeks Onset: sudden Severity: severe Quality: sharp and stabbing Location:  epigastric  Episode duration:  Radiation: yes into his back Frequency: constant Alleviating factors: nothing Aggravating factors: eating Status: worse Treatments attempted: flexeril Fever: no Nausea: yes Vomiting: no Weight loss: no Decreased appetite: no Diarrhea: yes Constipation: no Blood in stool: no Heartburn: yes Jaundice: no Rash: no Dysuria/urinary frequency: no Hematuria: no History of sexually transmitted disease: no Recurrent NSAID use: no  Relevant past medical, surgical, family and social history reviewed and updated as indicated. Interim medical history since our last visit reviewed. Allergies and medications reviewed and updated.  Review of Systems  Constitutional: Negative.   HENT: Negative.   Respiratory: Negative.   Cardiovascular: Negative.   Gastrointestinal: Positive for abdominal pain and nausea. Negative for abdominal distention, anal bleeding, blood in stool, constipation, diarrhea, rectal pain and vomiting.  Genitourinary: Negative.   Psychiatric/Behavioral: Negative.     Per HPI unless specifically indicated above     Objective:    BP (!) 152/100   Pulse 98   Temp 98.6 F (37 C) (Oral)   Ht 5\' 9"  (1.753 m)   Wt 248 lb (112.5 kg)   SpO2 97%   BMI 36.62 kg/m   Wt Readings from Last 3  Encounters:  05/29/18 248 lb (112.5 kg)  05/22/18 246 lb (111.6 kg)  05/12/18 250 lb (113.4 kg)    Physical Exam Vitals signs and nursing note reviewed.  Constitutional:      General: He is not in acute distress.    Appearance: Normal appearance. He is obese. He is not ill-appearing, toxic-appearing or diaphoretic.  HENT:     Head: Normocephalic and atraumatic.     Right Ear: External ear normal.     Left Ear: External ear normal.     Nose: Nose normal.     Mouth/Throat:     Mouth: Mucous membranes are moist.     Pharynx: Oropharynx is clear.  Eyes:     General: No scleral icterus.       Right eye: No discharge.        Left eye: No discharge.     Extraocular Movements: Extraocular movements intact.     Conjunctiva/sclera: Conjunctivae normal.     Pupils: Pupils are equal, round, and reactive to light.  Neck:     Musculoskeletal: Normal range of motion and neck supple.  Cardiovascular:     Rate and Rhythm: Normal rate and regular rhythm.     Pulses: Normal pulses.     Heart sounds: Normal heart sounds. No murmur. No friction rub. No gallop.   Pulmonary:     Effort: Pulmonary effort is normal. No respiratory distress.  Breath sounds: Normal breath sounds. No stridor. No wheezing, rhonchi or rales.  Chest:     Chest wall: No tenderness.  Abdominal:     General: Abdomen is flat. Bowel sounds are normal.     Palpations: Abdomen is soft. There is no shifting dullness, fluid wave, hepatomegaly, splenomegaly, mass or pulsatile mass.     Tenderness: There is abdominal tenderness in the epigastric area. There is no right CVA tenderness, left CVA tenderness, guarding or rebound. Negative signs include Murphy's sign, Rovsing's sign, McBurney's sign, psoas sign and obturator sign.  Musculoskeletal: Normal range of motion.  Skin:    General: Skin is warm and dry.     Capillary Refill: Capillary refill takes less than 2 seconds.     Coloration: Skin is not jaundiced or pale.      Findings: No bruising, erythema, lesion or rash.  Neurological:     General: No focal deficit present.     Mental Status: He is alert and oriented to person, place, and time. Mental status is at baseline.  Psychiatric:        Mood and Affect: Mood normal.        Behavior: Behavior normal.        Thought Content: Thought content normal.        Judgment: Judgment normal.     Results for orders placed or performed in visit on 05/12/18  Bayer DCA Hb A1c Waived  Result Value Ref Range   HB A1C (BAYER DCA - WAIVED) 7.6 (H) <7.0 %  Comprehensive metabolic panel  Result Value Ref Range   Glucose 118 (H) 65 - 99 mg/dL   BUN 10 6 - 24 mg/dL   Creatinine, Ser 1.17 0.76 - 1.27 mg/dL   GFR calc non Af Amer 77 >59 mL/min/1.73   GFR calc Af Amer 89 >59 mL/min/1.73   BUN/Creatinine Ratio 9 9 - 20   Sodium 138 134 - 144 mmol/L   Potassium 3.9 3.5 - 5.2 mmol/L   Chloride 98 96 - 106 mmol/L   CO2 23 20 - 29 mmol/L   Calcium 9.2 8.7 - 10.2 mg/dL   Total Protein 6.9 6.0 - 8.5 g/dL   Albumin 4.5 3.5 - 5.5 g/dL   Globulin, Total 2.4 1.5 - 4.5 g/dL   Albumin/Globulin Ratio 1.9 1.2 - 2.2   Bilirubin Total 0.7 0.0 - 1.2 mg/dL   Alkaline Phosphatase 75 39 - 117 IU/L   AST 17 0 - 40 IU/L   ALT 23 0 - 44 IU/L  Lipid Panel w/o Chol/HDL Ratio  Result Value Ref Range   Cholesterol, Total 210 (H) 100 - 199 mg/dL   Triglycerides 460 (H) 0 - 149 mg/dL   HDL 32 (L) >39 mg/dL   VLDL Cholesterol Cal Comment 5 - 40 mg/dL   LDL Calculated Comment 0 - 99 mg/dL      Assessment & Plan:   Problem List Items Addressed This Visit    None    Visit Diagnoses    Epigastric pain    -  Primary   Concern for gastritis. Will treat with omeprazole, carafate and check labs. Referral to GI. Call with any concerns.    Relevant Orders   Ambulatory referral to Gastroenterology   Comprehensive metabolic panel   Amylase   Lipase   CBC with Differential/Platelet       Follow up plan: Return in about 2 weeks  (around 06/12/2018).

## 2018-05-30 LAB — CBC WITH DIFFERENTIAL/PLATELET
BASOS ABS: 0.1 10*3/uL (ref 0.0–0.2)
Basos: 1 %
EOS (ABSOLUTE): 0.2 10*3/uL (ref 0.0–0.4)
Eos: 2 %
Hematocrit: 48.8 % (ref 37.5–51.0)
Hemoglobin: 17.1 g/dL (ref 13.0–17.7)
Immature Grans (Abs): 0 10*3/uL (ref 0.0–0.1)
Immature Granulocytes: 0 %
Lymphocytes Absolute: 2.8 10*3/uL (ref 0.7–3.1)
Lymphs: 24 %
MCH: 31.9 pg (ref 26.6–33.0)
MCHC: 35 g/dL (ref 31.5–35.7)
MCV: 91 fL (ref 79–97)
MONOS ABS: 0.7 10*3/uL (ref 0.1–0.9)
Monocytes: 6 %
Neutrophils Absolute: 8.2 10*3/uL — ABNORMAL HIGH (ref 1.4–7.0)
Neutrophils: 67 %
Platelets: 164 10*3/uL (ref 150–450)
RBC: 5.36 x10E6/uL (ref 4.14–5.80)
RDW: 13 % (ref 11.6–15.4)
WBC: 12 10*3/uL — ABNORMAL HIGH (ref 3.4–10.8)

## 2018-05-30 LAB — COMPREHENSIVE METABOLIC PANEL
ALT: 24 IU/L (ref 0–44)
AST: 12 IU/L (ref 0–40)
Albumin/Globulin Ratio: 1.8 (ref 1.2–2.2)
Albumin: 4.5 g/dL (ref 4.0–5.0)
Alkaline Phosphatase: 76 IU/L (ref 39–117)
BUN/Creatinine Ratio: 11 (ref 9–20)
BUN: 12 mg/dL (ref 6–24)
Bilirubin Total: 1.2 mg/dL (ref 0.0–1.2)
CHLORIDE: 100 mmol/L (ref 96–106)
CO2: 23 mmol/L (ref 20–29)
Calcium: 9.6 mg/dL (ref 8.7–10.2)
Creatinine, Ser: 1.09 mg/dL (ref 0.76–1.27)
GFR calc Af Amer: 97 mL/min/{1.73_m2} (ref >59)
GFR calc non Af Amer: 84 mL/min/{1.73_m2} (ref >59)
GLUCOSE: 108 mg/dL — AB (ref 65–99)
Globulin, Total: 2.5 g/dL (ref 1.5–4.5)
Potassium: 3.9 mmol/L (ref 3.5–5.2)
Sodium: 140 mmol/L (ref 134–144)
Total Protein: 7 g/dL (ref 6.0–8.5)

## 2018-05-30 LAB — AMYLASE: Amylase: 46 U/L (ref 31–110)

## 2018-05-30 LAB — LIPASE: LIPASE: 26 U/L (ref 13–78)

## 2018-06-17 ENCOUNTER — Encounter: Payer: Self-pay | Admitting: Gastroenterology

## 2018-06-17 ENCOUNTER — Ambulatory Visit: Payer: Commercial Managed Care - PPO | Admitting: Gastroenterology

## 2018-06-17 VITALS — BP 127/90 | HR 105 | Resp 18 | Ht 69.0 in | Wt 248.8 lb

## 2018-06-17 DIAGNOSIS — R1013 Epigastric pain: Secondary | ICD-10-CM | POA: Diagnosis not present

## 2018-06-17 DIAGNOSIS — M546 Pain in thoracic spine: Secondary | ICD-10-CM | POA: Diagnosis not present

## 2018-06-17 NOTE — Progress Notes (Addendum)
Casey Darby, MD [address]  [place]  [place], [place] [address]  Main: [phone]  Fax: [phone]    Gastroenterology Consultation  Referring Provider:     Valerie Roys, DO Primary Care Physician:  Valerie Roys, DO Primary Gastroenterologist:  Dr. Cephas Cole Reason for Consultation:     Epigastric pain        HPI:   Casey Cole is a 43 y.o. male referred by Dr. Wynetta Emery, Barb Merino, DO  for consultation & management of acute episode of upper abdominal pain and mid back pain that occurred about 2 weeks ago.  Patient has history of metabolic syndrome, psoriasis on guselkumab.  He reports that about 2 weeks ago he woke up with severe mid back pain and some upper abdominal pain stabbing in nature associated with nausea.  The pain was worse after eating and with movement.  With his history of cholecystitis and choledocholithiasis, patient underwent work-up including lipase, CBC, CMP which were unremarkable.  Patient history of cholecystectomy and choledocholithiasis, underwent ERCP with at [facility] in 04/1999 extraction of large CBD stone.  Patient was started on omeprazole which he took for a day and pain resolved after that.  He is not experiencing any of the above symptoms at this time.  He does smoke cigarettes, 1 pack/day since age of 43 Denies drinking alcohol He denies family history of pancreatic cancer, colon cancer or other GI malignancies  NSAIDs: None  Antiplts/Anticoagulants/Anti thrombotics: None  GI Procedures: None  Past Medical History:  Diagnosis Date  . Abscess of superficial perineal space 08/23/2015  . Depression 10/27/2015  . Diabetes mellitus without complication ([place])   . Hyperlipidemia   . Hypertension   . Sleep apnea     Past Surgical History:  Procedure Laterality Date  . CHOLECYSTECTOMY    . INCISE AND DRAIN ABCESS Left 07-12-15   left buttock  . INCISION AND DRAINAGE PERIRECTAL ABSCESS N/A 08/23/2015   Procedure: IRRIGATION AND  DEBRIDEMENT PERIRECTAL ABSCESS;  Surgeon: Robert Bellow, MD;  Location: [facility] ORS;  Service: General;  Laterality: N/A;  Anal Scope  . VASECTOMY      Current Outpatient Medications:  .  atorvastatin (LIPITOR) 20 MG tablet, Take 1 tablet (20 mg total) by mouth daily., Disp: 90 tablet, Rfl: 1 .  clobetasol ointment (TEMOVATE) 0.63 %, Apply 1 application topically 2 (two) times daily., Disp: 60 g, Rfl: 0 .  Dapagliflozin-metFORMIN HCl ER (XIGDUO XR) 09-998 MG TB24, Take 1 tablet by mouth 2 (two) times daily., Disp: 180 tablet, Rfl: 1 .  Guselkumab (TREMFYA [place]), Inject into the skin., Disp: , Rfl:  .  ONE TOUCH ULTRA TEST test strip, USE AS DIRECTED ONCE DAILY, Disp: 100 each, Rfl: 0 .  Semaglutide,0.25 or 0.5MG /DOS, (OZEMPIC, 0.25 OR 0.5 MG/DOSE,) 2 MG/1.5ML SOPN, Inject 0.5 mg into the skin once a week., Disp: 3 pen, Rfl: 3    Family History  Problem Relation Age of Onset  . Hypertension Father   . Stroke Paternal Grandfather   . Heart attack Paternal Grandfather      Social History   Tobacco Use  . Smoking status: Current Every Day Smoker    Packs/day: 1.00    Types: Cigarettes  . Smokeless tobacco: Never Used  Substance Use Topics  . Alcohol use: Yes    Alcohol/week: 3.0 standard drinks    Types: 3 Shots of liquor per week  . Drug use: No    Allergies as of 06/17/2018  . (  No Known Allergies)    Review of Systems:    All systems reviewed and negative except where noted in HPI.   Physical Exam:  BP 127/90 (BP Location: Left Arm, Patient Position: Sitting, Cuff Size: Large)   Pulse (!) 105   Resp 18   Ht 5\' 9"  (1.753 m)   Wt 248 lb 12.8 oz (112.9 kg)   BMI 36.74 kg/m  No LMP for male patient.  General:   Alert,  Well-developed, well-nourished, pleasant and cooperative in NAD Head:  Normocephalic and atraumatic. Eyes:  Sclera clear, no icterus.   Conjunctiva pink. Ears:  Normal auditory acuity. Nose:  No deformity, discharge, or lesions. Mouth:  No deformity or  lesions,oropharynx pink & moist. Neck:  Supple; no masses or thyromegaly. Lungs:  Respirations even and unlabored.  Clear throughout to auscultation.   No wheezes, crackles, or rhonchi. No acute distress. Heart:  Regular rate and rhythm; no murmurs, clicks, rubs, or gallops. Abdomen:  Normal bowel sounds. Soft, abdominal obesity non-tender and non-distended without masses, unable to assess for hepatosplenomegaly due to body habitus, no hernias noted.  No guarding or rebound tenderness.   Rectal: Not performed Msk:  Symmetrical without gross deformities. Good, equal movement & strength bilaterally. Pulses:  Normal pulses noted. Extremities:  No clubbing or edema.  No cyanosis. Neurologic:  Alert and oriented x3;  grossly normal neurologically. Skin:  Intact without significant lesions or rashes. No jaundice. Psych:  Alert and cooperative. Normal mood and affect.  Imaging Studies: No abdominal imaging  Assessment and Plan:   Casey Cole is a 43 y.o. Caucasian male with metabolic syndrome, with acute episode of mid back pain and epigastric pain, self-limited.  Musculoskeletal pain versus GI etiology.  Labs unremarkable  Do not recommend further work-up at this time Advised patient to schedule a follow-up if pain recurs. I will discuss about EGD and CT abdomen at that time   Follow up as needed   Casey Darby, MD

## 2018-08-11 ENCOUNTER — Ambulatory Visit: Payer: Commercial Managed Care - PPO | Admitting: Family Medicine

## 2018-09-10 ENCOUNTER — Ambulatory Visit (INDEPENDENT_AMBULATORY_CARE_PROVIDER_SITE_OTHER): Payer: Commercial Managed Care - PPO | Admitting: Family Medicine

## 2018-09-10 ENCOUNTER — Other Ambulatory Visit: Payer: Self-pay

## 2018-09-10 ENCOUNTER — Encounter: Payer: Self-pay | Admitting: Family Medicine

## 2018-09-10 VITALS — HR 106 | Ht 69.0 in | Wt 248.0 lb

## 2018-09-10 DIAGNOSIS — E119 Type 2 diabetes mellitus without complications: Secondary | ICD-10-CM

## 2018-09-10 DIAGNOSIS — E782 Mixed hyperlipidemia: Secondary | ICD-10-CM

## 2018-09-10 NOTE — Progress Notes (Signed)
Pulse (!) 106   Ht 5\' 9"  (1.753 m)   Wt 248 lb (112.5 kg)   BMI 36.62 kg/m    Subjective:    Patient ID: Casey Cole, male    DOB: [birth year], 43 y.o.   MRN: [id number]  HPI: Casey Cole is a 42 y.o. male  Chief Complaint  Patient presents with  . Diabetes  . Follow-up   DIABETES Hypoglycemic episodes:no Polydipsia/polyuria: no Visual disturbance: no Chest pain: no Paresthesias: no Glucose Monitoring: yes  Accucheck frequency: Occasionally  Fasting glucose: 153 Taking Insulin?: no Blood Pressure Monitoring: not checking Retinal Examination: Up to Date Foot Exam: Up to Date Diabetic Education: Completed Pneumovax: Up to Date Influenza: Up to Date Aspirin: yes   Relevant past medical, surgical, family and social history reviewed and updated as indicated. Interim medical history since our last visit reviewed. Allergies and medications reviewed and updated.  Review of Systems  Constitutional: Negative.   Respiratory: Negative.   Cardiovascular: Negative.   Psychiatric/Behavioral: Negative.     Per HPI unless specifically indicated above     Objective:    Pulse (!) 106   Ht 5\' 9"  (1.753 m)   Wt 248 lb (112.5 kg)   BMI 36.62 kg/m   Wt Readings from Last 3 Encounters:  09/10/18 248 lb (112.5 kg)  06/17/18 248 lb 12.8 oz (112.9 kg)  05/29/18 248 lb (112.5 kg)    Physical Exam Vitals signs and nursing note reviewed.  Constitutional:      General: He is not in acute distress.    Appearance: Normal appearance. He is not ill-appearing, toxic-appearing or diaphoretic.  HENT:     Head: Normocephalic and atraumatic.     Right Ear: External ear normal.     Left Ear: External ear normal.     Nose: Nose normal.     Mouth/Throat:     Mouth: Mucous membranes are moist.     Pharynx: Oropharynx is clear.  Eyes:     General: No scleral icterus.       Right eye: No discharge.        Left eye: No discharge.     Conjunctiva/sclera: Conjunctivae normal.   Pupils: Pupils are equal, round, and reactive to light.  Neck:     Musculoskeletal: Normal range of motion.  Pulmonary:     Effort: Pulmonary effort is normal. No respiratory distress.     Comments: Speaking in full sentences Musculoskeletal: Normal range of motion.  Skin:    Coloration: Skin is not jaundiced or pale.     Findings: No bruising, erythema, lesion or rash.  Neurological:     Mental Status: He is alert and oriented to person, place, and time. Mental status is at baseline.  Psychiatric:        Mood and Affect: Mood normal.        Behavior: Behavior normal.        Thought Content: Thought content normal.        Judgment: Judgment normal.     Results for orders placed or performed in visit on 05/29/18  Comprehensive metabolic panel  Result Value Ref Range   Glucose 108 (H) 65 - 99 mg/dL   BUN 12 6 - 24 mg/dL   Creatinine, Ser 1.09 0.76 - 1.27 mg/dL   GFR calc non Af Amer 84 >59 mL/min/1.73   GFR calc Af Amer 97 >59 mL/min/1.73   BUN/Creatinine Ratio 11 9 - 20   Sodium 140 134 - 144 mmol/L  Potassium 3.9 3.5 - 5.2 mmol/L   Chloride 100 96 - 106 mmol/L   CO2 23 20 - 29 mmol/L   Calcium 9.6 8.7 - 10.2 mg/dL   Total Protein 7.0 6.0 - 8.5 g/dL   Albumin 4.5 4.0 - 5.0 g/dL   Globulin, Total 2.5 1.5 - 4.5 g/dL   Albumin/Globulin Ratio 1.8 1.2 - 2.2   Bilirubin Total 1.2 0.0 - 1.2 mg/dL   Alkaline Phosphatase 76 39 - 117 IU/L   AST 12 0 - 40 IU/L   ALT 24 0 - 44 IU/L  Amylase  Result Value Ref Range   Amylase 46 31 - 110 U/L  Lipase  Result Value Ref Range   Lipase 26 13 - 78 U/L  CBC with Differential/Platelet  Result Value Ref Range   WBC 12.0 (H) 3.4 - 10.8 x10E3/uL   RBC 5.36 4.14 - 5.80 x10E6/uL   Hemoglobin 17.1 13.0 - 17.7 g/dL   Hematocrit 48.8 37.5 - 51.0 %   MCV 91 79 - 97 fL   MCH 31.9 26.6 - 33.0 pg   MCHC 35.0 31.5 - 35.7 g/dL   RDW 13.0 11.6 - 15.4 %   Platelets 164 150 - 450 x10E3/uL   Neutrophils 67 Not Estab. %   Lymphs 24 Not Estab. %    Monocytes 6 Not Estab. %   Eos 2 Not Estab. %   Basos 1 Not Estab. %   Neutrophils Absolute 8.2 (H) 1.4 - 7.0 x10E3/uL   Lymphocytes Absolute 2.8 0.7 - 3.1 x10E3/uL   Monocytes Absolute 0.7 0.1 - 0.9 x10E3/uL   EOS (ABSOLUTE) 0.2 0.0 - 0.4 x10E3/uL   Basophils Absolute 0.1 0.0 - 0.2 x10E3/uL   Immature Granulocytes 0 Not Estab. %   Immature Grans (Abs) 0.0 0.0 - 0.1 x10E3/uL      Assessment & Plan:   Problem List Items Addressed This Visit      Endocrine   Diabetes mellitus without complication ([place]) - Primary    Will recheck labs. Continue current regimen. Call with any concerns. Continue to monitor.       Relevant Orders   Comprehensive metabolic panel   Microalbumin, Urine Waived   Bayer DCA Hb A1c Waived     Other   Hyperlipidemia    Will recheck cholesterol and CMP. Continue current regimen. Continue to monitor. Call with any concerns.       Relevant Orders   Comprehensive metabolic panel   Lipid Panel w/o Chol/HDL Ratio       Follow up plan: Return in about 3 months (around 12/11/2018) for Physical.    . This visit was completed via FaceTime due to the restrictions of the COVID-19 pandemic. All issues as above were discussed and addressed. Physical exam was done as above through visual confirmation on FaceTime. If it was felt that the patient should be evaluated in the office, they were directed there. The patient verbally consented to this visit. . Location of the patient: parking lot . Location of the provider: home . Those involved with this call:  . Provider: Park Liter, DO . CMA: Gerda Diss, CMA . Front Desk/Registration: Don Perking  . Time spent on call: 15 minutes with patient face to face via video conference. More than 50% of this time was spent in counseling and coordination of care. 23 minutes total spent in review of patient's record and preparation of their chart.

## 2018-09-10 NOTE — Assessment & Plan Note (Signed)
Will recheck labs. Continue current regimen. Call with any concerns. Continue to monitor.

## 2018-09-10 NOTE — Assessment & Plan Note (Signed)
Will recheck cholesterol and CMP. Continue current regimen. Continue to monitor. Call with any concerns.

## 2018-09-14 ENCOUNTER — Other Ambulatory Visit: Payer: Commercial Managed Care - PPO

## 2018-09-14 ENCOUNTER — Other Ambulatory Visit: Payer: Self-pay

## 2018-09-14 DIAGNOSIS — E782 Mixed hyperlipidemia: Secondary | ICD-10-CM

## 2018-09-14 DIAGNOSIS — E119 Type 2 diabetes mellitus without complications: Secondary | ICD-10-CM

## 2018-09-14 LAB — MICROALBUMIN, URINE WAIVED
Creatinine, Urine Waived: 50 mg/dL (ref 10–300)
Microalb, Ur Waived: 10 mg/L (ref 0–19)

## 2018-09-14 LAB — BAYER DCA HB A1C WAIVED: HB A1C (BAYER DCA - WAIVED): 7.7 % — ABNORMAL HIGH (ref <7.0)

## 2018-09-15 ENCOUNTER — Other Ambulatory Visit: Payer: Self-pay | Admitting: Family Medicine

## 2018-09-15 LAB — COMPREHENSIVE METABOLIC PANEL
ALT: 26 IU/L (ref 0–44)
AST: 15 IU/L (ref 0–40)
Albumin/Globulin Ratio: 1.6 (ref 1.2–2.2)
Albumin: 4.6 g/dL (ref 4.0–5.0)
Alkaline Phosphatase: 70 IU/L (ref 39–117)
BUN/Creatinine Ratio: 10 (ref 9–20)
BUN: 9 mg/dL (ref 6–24)
Bilirubin Total: 0.9 mg/dL (ref 0.0–1.2)
CO2: 25 mmol/L (ref 20–29)
Calcium: 9.8 mg/dL (ref 8.7–10.2)
Chloride: 99 mmol/L (ref 96–106)
Creatinine, Ser: 0.91 mg/dL (ref 0.76–1.27)
GFR calc Af Amer: 121 mL/min/{1.73_m2} (ref >59)
GFR calc non Af Amer: 104 mL/min/{1.73_m2} (ref >59)
Globulin, Total: 2.8 g/dL (ref 1.5–4.5)
Glucose: 129 mg/dL — ABNORMAL HIGH (ref 65–99)
Potassium: 4.1 mmol/L (ref 3.5–5.2)
Sodium: 138 mmol/L (ref 134–144)
Total Protein: 7.4 g/dL (ref 6.0–8.5)

## 2018-09-15 LAB — LIPID PANEL W/O CHOL/HDL RATIO
Cholesterol, Total: 202 mg/dL — ABNORMAL HIGH (ref 100–199)
HDL: 31 mg/dL — ABNORMAL LOW (ref >39)
Triglycerides: 403 mg/dL — ABNORMAL HIGH (ref 0–149)

## 2018-09-15 MED ORDER — SEMAGLUTIDE (1 MG/DOSE) 2 MG/1.5ML ~~LOC~~ SOPN: 1.0000 mg | PEN_INJECTOR | SUBCUTANEOUS | 3 refills | Status: DC

## 2018-10-15 ENCOUNTER — Telehealth: Payer: Self-pay | Admitting: Family Medicine

## 2018-10-15 NOTE — Telephone Encounter (Signed)
Copied from [place] [phone]. Topic: Quick Communication - See Telephone Encounter >> Oct 15, 2018 12:16 PM Ivar Drape wrote: CRM for notification. See Telephone encounter for: 10/15/18. Patient would like to talk to his provider because he is experiencing stomach pain and diarrhea from his Semaglutide, 1 MG/DOSE, (OZEMPIC, 1 MG/DOSE,) 2 MG/1.5ML SOPN medication.

## 2018-10-25 NOTE — Progress Notes (Signed)
BP 124/86   Pulse 92   Temp 98.8 F (37.1 C) (Oral)   Ht 5\' 9"  (1.753 m)   Wt 241 lb (109.3 kg)   SpO2 97%   BMI 35.59 kg/m    Subjective:    Patient ID: Casey Cole, male    DOB: [birth year], 43 y.o.   MRN: [id number]  HPI: Casey Cole is a 43 y.o. male presenting on 10/26/2018 for comprehensive medical examination. Current medical complaints include:  DIABETES- A1c had gone up and we had increased his ozempic from 0.5-1mg , he started having some diarrhea on the higher dose and is now better.  Hypoglycemic episodes:no Polydipsia/polyuria: no Visual disturbance: no Chest pain: no Paresthesias: no Glucose Monitoring: yes  Accucheck frequency: Daily Taking Insulin?: no Blood Pressure Monitoring: not checking Retinal Examination: Up to Date Foot Exam: Up to Date Diabetic Education: Completed Pneumovax: Up to Date Influenza: Up to Date Aspirin: no  HYPERTENSION / [place] Satisfied with current treatment? yes Duration of hypertension: chronic BP monitoring frequency: not checking BP medication side effects: not on anything Past BP meds: none Duration of hyperlipidemia: chronic Cholesterol medication side effects: no Cholesterol supplements: none Past cholesterol medications: atorvastatin Medication compliance: excellent compliance Aspirin: no Recent stressors: yes Recurrent headaches: no Visual changes: no Palpitations: no Dyspnea: no Chest pain: no Lower extremity edema: no Dizzy/lightheaded: no  DEPRESSION Mood status: controlled Satisfied with current treatment?: yes Symptom severity: mild  Duration of current treatment : not on anything Psychotherapy/counseling: no  Depressed mood: no Anxious mood: no Anhedonia: no Significant weight loss or gain: no Insomnia: no  Fatigue: no Feelings of worthlessness or guilt: no Impaired concentration/indecisiveness: no Suicidal ideations: no Hopelessness: no Crying spells: no Depression screen [facility] 2/9  10/26/2018 01/15/2018 10/30/2017 09/09/2017 05/08/2017  Decreased Interest 1 1 1 2  0  Down, Depressed, Hopeless 1 1 0 1 0  PHQ - 2 Score 2 2 1 3  0  Altered sleeping 0 2 2 2  -  Tired, decreased energy 1 2 2 1  -  Change in appetite 1 2 0 1 -  Feeling bad or failure about yourself  0 2 1 1  -  Trouble concentrating 1 2 2 2  -  Moving slowly or fidgety/restless 0 1 0 0 -  Suicidal thoughts 0 0 0 0 -  PHQ-9 Score 5 13 8 10  -  Difficult doing work/chores Somewhat difficult - Somewhat difficult Somewhat difficult -   HIP PAIN Duration: 2 weeks Involved hip: left  Mechanism of injury: unknown Location: medial Onset: sudden  Severity: moderate  Quality: catching Frequency: intermittent Radiation: yes Aggravating factors: squatting    Alleviating factors: nothing  Status: stable Treatments attempted: none   Relief with NSAIDs?: mild Weakness with weight bearing: no Weakness with walking: no Paresthesias / decreased sensation: no Swelling: no Redness:no Fevers: no   Depression Screen done today and results listed below:  Depression screen [facility] 2/9 10/26/2018 01/15/2018 10/30/2017 09/09/2017 05/08/2017  Decreased Interest 1 1 1 2  0  Down, Depressed, Hopeless 1 1 0 1 0  PHQ - 2 Score 2 2 1 3  0  Altered sleeping 0 2 2 2  -  Tired, decreased energy 1 2 2 1  -  Change in appetite 1 2 0 1 -  Feeling bad or failure about yourself  0 2 1 1  -  Trouble concentrating 1 2 2 2  -  Moving slowly or fidgety/restless 0 1 0 0 -  Suicidal thoughts 0 0 0 0 -  PHQ-9 Score 5  13 8 10  -  Difficult doing work/chores Somewhat difficult - Somewhat difficult Somewhat difficult -    Past Medical History:  Past Medical History:  Diagnosis Date  . Abscess of superficial perineal space 08/23/2015  . Depression 10/27/2015  . Diabetes mellitus without complication ([place])   . Hyperlipidemia   . Hypertension   . Sleep apnea     Surgical History:  Past Surgical History:  Procedure Laterality Date  . CHOLECYSTECTOMY     . INCISE AND DRAIN ABCESS Left 07-12-15   left buttock  . INCISION AND DRAINAGE PERIRECTAL ABSCESS N/A 08/23/2015   Procedure: IRRIGATION AND DEBRIDEMENT PERIRECTAL ABSCESS;  Surgeon: Robert Bellow, MD;  Location: [facility] ORS;  Service: General;  Laterality: N/A;  Anal Scope  . VASECTOMY      Medications:  Current Outpatient Medications on File Prior to Visit  Medication Sig  . clobetasol ointment (TEMOVATE) 8.50 % Apply 1 application topically 2 (two) times daily.  . Guselkumab (TREMFYA [place]) Inject into the skin.  . ONE TOUCH ULTRA TEST test strip USE AS DIRECTED ONCE DAILY  . Semaglutide, 1 MG/DOSE, (OZEMPIC, 1 MG/DOSE,) 2 MG/1.5ML SOPN Inject 1 mg into the skin once a week.   No current facility-administered medications on file prior to visit.     Allergies:  No Known Allergies  Social History:  Social History   Socioeconomic History  . Marital status: Married    Spouse name: Not on file  . Number of children: Not on file  . Years of education: Not on file  . Highest education level: Not on file  Occupational History  . Not on file  Social Needs  . Financial resource strain: Not on file  . Food insecurity    Worry: Not on file    Inability: Not on file  . Transportation needs    Medical: Not on file    Non-medical: Not on file  Tobacco Use  . Smoking status: Current Every Day Smoker    Packs/day: 1.00    Types: Cigarettes  . Smokeless tobacco: Never Used  Substance and Sexual Activity  . Alcohol use: Yes    Alcohol/week: 3.0 standard drinks    Types: 3 Shots of liquor per week  . Drug use: No  . Sexual activity: Yes  Lifestyle  . Physical activity    Days per week: Not on file    Minutes per session: Not on file  . Stress: Not on file  Relationships  . Social Herbalist on phone: Not on file    Gets together: Not on file    Attends religious service: Not on file    Active member of club or organization: Not on file    Attends meetings of clubs  or organizations: Not on file    Relationship status: Not on file  . Intimate partner violence    Fear of current or ex partner: Not on file    Emotionally abused: Not on file    Physically abused: Not on file    Forced sexual activity: Not on file  Other Topics Concern  . Not on file  Social History Narrative  . Not on file   Social History   Tobacco Use  Smoking Status Current Every Day Smoker  . Packs/day: 1.00  . Types: Cigarettes  Smokeless Tobacco Never Used   Social History   Substance and Sexual Activity  Alcohol Use Yes  . Alcohol/week: 3.0 standard drinks  . Types: 3  Shots of liquor per week    Family History:  Family History  Problem Relation Age of Onset  . Hypertension Father   . Stroke Paternal Grandfather   . Heart attack Paternal Grandfather     Past medical history, surgical history, medications, allergies, family history and social history reviewed with patient today and changes made to appropriate areas of the chart.   Review of Systems  Constitutional: Negative.   HENT: Negative.        Bad smell in his nose  Eyes: Negative.   Respiratory: Negative.   Cardiovascular: Negative.   Gastrointestinal: Negative.   Genitourinary: Negative.   Musculoskeletal: Positive for joint pain. Negative for back pain, falls, myalgias and neck pain.  Skin: Negative.   Neurological: Negative.   Endo/Heme/Allergies: Negative.   Psychiatric/Behavioral: Negative.     All other ROS negative except what is listed above and in the HPI.      Objective:    BP 124/86   Pulse 92   Temp 98.8 F (37.1 C) (Oral)   Ht 5\' 9"  (1.753 m)   Wt 241 lb (109.3 kg)   SpO2 97%   BMI 35.59 kg/m   Wt Readings from Last 3 Encounters:  10/26/18 241 lb (109.3 kg)  09/10/18 248 lb (112.5 kg)  06/17/18 248 lb 12.8 oz (112.9 kg)    Physical Exam Vitals signs and nursing note reviewed.  Constitutional:      General: He is not in acute distress.    Appearance: Normal  appearance. He is obese. He is not ill-appearing, toxic-appearing or diaphoretic.  HENT:     Head: Normocephalic and atraumatic.     Right Ear: Tympanic membrane, ear canal and external ear normal. There is no impacted cerumen.     Left Ear: Tympanic membrane, ear canal and external ear normal. There is no impacted cerumen.     Nose: Congestion and rhinorrhea present.     Comments: Pus coming out of turbinates on the R, tender to maxillary sinus on the R    Mouth/Throat:     Mouth: Mucous membranes are moist.     Pharynx: Oropharynx is clear. No oropharyngeal exudate or posterior oropharyngeal erythema.  Eyes:     General: No scleral icterus.       Right eye: No discharge.        Left eye: No discharge.     Extraocular Movements: Extraocular movements intact.     Conjunctiva/sclera: Conjunctivae normal.     Pupils: Pupils are equal, round, and reactive to light.  Neck:     Musculoskeletal: Normal range of motion and neck supple. No neck rigidity or muscular tenderness.     Vascular: No carotid bruit.  Cardiovascular:     Rate and Rhythm: Normal rate and regular rhythm.     Pulses: Normal pulses.     Heart sounds: No murmur. No friction rub. No gallop.   Pulmonary:     Effort: Pulmonary effort is normal. No respiratory distress.     Breath sounds: Normal breath sounds. No stridor. No wheezing, rhonchi or rales.  Chest:     Chest wall: No tenderness.  Abdominal:     General: Abdomen is flat. Bowel sounds are normal. There is no distension.     Palpations: Abdomen is soft. There is no mass.     Tenderness: There is no abdominal tenderness. There is no right CVA tenderness, left CVA tenderness, guarding or rebound.     Hernia: No hernia is  present.  Genitourinary:    Comments: Genital exam deferred with shared decision making Musculoskeletal:        General: No swelling, tenderness, deformity or signs of injury.     Right lower leg: No edema.     Left lower leg: No edema.      Comments: + faber's on the L, tenderness to psoas, decreased ROM to L hip  Lymphadenopathy:     Cervical: No cervical adenopathy.  Skin:    General: Skin is warm and dry.     Capillary Refill: Capillary refill takes less than 2 seconds.     Coloration: Skin is not jaundiced or pale.     Findings: No bruising, erythema, lesion or rash.  Neurological:     General: No focal deficit present.     Mental Status: He is alert and oriented to person, place, and time.     Cranial Nerves: No cranial nerve deficit.     Sensory: No sensory deficit.     Motor: No weakness.     Coordination: Coordination normal.     Gait: Gait normal.     Deep Tendon Reflexes: Reflexes normal.  Psychiatric:        Mood and Affect: Mood normal.        Behavior: Behavior normal.        Thought Content: Thought content normal.        Judgment: Judgment normal.     Results for orders placed or performed in visit on 09/14/18  Bayer DCA Hb A1c Waived  Result Value Ref Range   HB A1C (BAYER DCA - WAIVED) 7.7 (H) <7.0 %  Comprehensive metabolic panel  Result Value Ref Range   Glucose 129 (H) 65 - 99 mg/dL   BUN 9 6 - 24 mg/dL   Creatinine, Ser 0.91 0.76 - 1.27 mg/dL   GFR calc non Af Amer 104 >59 mL/min/1.73   GFR calc Af Amer 121 >59 mL/min/1.73   BUN/Creatinine Ratio 10 9 - 20   Sodium 138 134 - 144 mmol/L   Potassium 4.1 3.5 - 5.2 mmol/L   Chloride 99 96 - 106 mmol/L   CO2 25 20 - 29 mmol/L   Calcium 9.8 8.7 - 10.2 mg/dL   Total Protein 7.4 6.0 - 8.5 g/dL   Albumin 4.6 4.0 - 5.0 g/dL   Globulin, Total 2.8 1.5 - 4.5 g/dL   Albumin/Globulin Ratio 1.6 1.2 - 2.2   Bilirubin Total 0.9 0.0 - 1.2 mg/dL   Alkaline Phosphatase 70 39 - 117 IU/L   AST 15 0 - 40 IU/L   ALT 26 0 - 44 IU/L  Lipid Panel w/o Chol/HDL Ratio  Result Value Ref Range   Cholesterol, Total 202 (H) 100 - 199 mg/dL   Triglycerides 403 (H) 0 - 149 mg/dL   HDL 31 (L) >39 mg/dL   VLDL Cholesterol Cal Comment 5 - 40 mg/dL   LDL Calculated  Comment 0 - 99 mg/dL  Microalbumin, Urine Waived  Result Value Ref Range   Microalb, Ur Waived 10 0 - 19 mg/L   Creatinine, Urine Waived 50 10 - 300 mg/dL   Microalb/Creat Ratio 30-300 (H) <30 mg/g      Assessment & Plan:   Problem List Items Addressed This Visit      Endocrine   Diabetes mellitus without complication ([place])    Doing much better with A1c down to 6.7 from 7.7. Continue current regimen. Continue to monitor. Call with any concerns.  Relevant Medications   atorvastatin (LIPITOR) 20 MG tablet   Dapagliflozin-metFORMIN HCl ER (XIGDUO XR) 09-998 MG TB24   Other Relevant Orders   Bayer DCA Hb A1c Waived   CBC with Differential/Platelet   Comprehensive metabolic panel   Microalbumin, Urine Waived   UA/M w/rflx Culture, Routine     Genitourinary   Benign hypertensive renal disease    Under good control on current regimen. Continue current regimen. Continue to monitor. Call with any concerns. Refills given. Labs checked today.       Relevant Orders   CBC with Differential/Platelet   Comprehensive metabolic panel   Microalbumin, Urine Waived   TSH   UA/M w/rflx Culture, Routine     Other   Hyperlipidemia    Under good control on current regimen. Continue current regimen. Continue to monitor. Call with any concerns. Refills given. Labs checked today.       Relevant Medications   atorvastatin (LIPITOR) 20 MG tablet   Other Relevant Orders   CBC with Differential/Platelet   Comprehensive metabolic panel   Lipid Panel w/o Chol/HDL Ratio   UA/M w/rflx Culture, Routine   Recurrent major depressive disorder, in full remission ([place])    Under good control off medicine. Continue to monitor. Call with any concerns.       Relevant Orders   CBC with Differential/Platelet   Comprehensive metabolic panel   UA/M w/rflx Culture, Routine    Other Visit Diagnoses    Routine general medical examination at a health care facility    -  Primary   Vaccines up to date.  Screening labs checked today. Continue diet and exercise. Call with any concerns.   Relevant Orders   CBC with Differential/Platelet   Comprehensive metabolic panel   Lipid Panel w/o Chol/HDL Ratio   Microalbumin, Urine Waived   TSH   UA/M w/rflx Culture, Routine   Acute non-recurrent maxillary sinusitis       Will treat with augmentin. Call if not getting better or getting worse. [place] to monitor.    Relevant Medications   amoxicillin-clavulanate (AUGMENTIN) 875-125 MG tablet   Left hip pain       Will start naproxen, exercises and flexeril. Call with any concerns or if not getting better.        Follow up plan: Return in about 3 months (around 01/26/2019) for follow up DM.   IMMUNIZATIONS:   - Tdap: Tetanus vaccination status reviewed: last tetanus booster within 10 years. - Influenza: Postponed to flu season - Pneumovax: Up to date   PATIENT COUNSELING:   Sexuality: Discussed sexually transmitted diseases, partner selection, use of condoms, avoidance of unintended pregnancy  and contraceptive alternatives.   Advised to avoid cigarette smoking.  I discussed with the patient that most people either abstain from alcohol or drink within safe limits (<=14/week and <=4 drinks/occasion for males, <=7/weeks and <= 3 drinks/occasion for females) and that the risk for alcohol disorders and other health effects rises proportionally with the number of drinks per week and how often a drinker exceeds daily limits.  Discussed cessation/primary prevention of drug use and availability of treatment for abuse.   Diet: Encouraged to adjust caloric intake to maintain  or achieve ideal body weight, to reduce intake of dietary saturated fat and total fat, to limit sodium intake by avoiding high sodium foods and not adding table salt, and to maintain adequate dietary potassium and calcium preferably from fresh fruits, vegetables, and low-fat dairy products.    stressed the  importance of regular  exercise  Injury prevention: Discussed safety belts, safety helmets, smoke detector, smoking near bedding or upholstery.   Dental health: Discussed importance of regular tooth brushing, flossing, and dental visits.    NEXT PREVENTATIVE PHYSICAL DUE IN 1 YEAR. Return in about 3 months (around 01/26/2019) for follow up DM.

## 2018-10-26 ENCOUNTER — Encounter: Payer: Self-pay | Admitting: Family Medicine

## 2018-10-26 ENCOUNTER — Ambulatory Visit (INDEPENDENT_AMBULATORY_CARE_PROVIDER_SITE_OTHER): Payer: Commercial Managed Care - PPO | Admitting: Family Medicine

## 2018-10-26 ENCOUNTER — Other Ambulatory Visit: Payer: Self-pay

## 2018-10-26 VITALS — BP 124/86 | HR 92 | Temp 98.8°F | Ht 69.0 in | Wt 241.0 lb

## 2018-10-26 DIAGNOSIS — F3342 Major depressive disorder, recurrent, in full remission: Secondary | ICD-10-CM | POA: Diagnosis not present

## 2018-10-26 DIAGNOSIS — Z0000 Encounter for general adult medical examination without abnormal findings: Secondary | ICD-10-CM | POA: Diagnosis not present

## 2018-10-26 DIAGNOSIS — E782 Mixed hyperlipidemia: Secondary | ICD-10-CM

## 2018-10-26 DIAGNOSIS — I129 Hypertensive chronic kidney disease with stage 1 through stage 4 chronic kidney disease, or unspecified chronic kidney disease: Secondary | ICD-10-CM

## 2018-10-26 DIAGNOSIS — E119 Type 2 diabetes mellitus without complications: Secondary | ICD-10-CM | POA: Diagnosis not present

## 2018-10-26 DIAGNOSIS — M25552 Pain in left hip: Secondary | ICD-10-CM

## 2018-10-26 DIAGNOSIS — J0100 Acute maxillary sinusitis, unspecified: Secondary | ICD-10-CM

## 2018-10-26 HISTORY — DX: Major depressive disorder, recurrent, in full remission: F33.42

## 2018-10-26 LAB — UA/M W/RFLX CULTURE, ROUTINE
Bilirubin, UA: NEGATIVE
Glucose, UA: NEGATIVE
Ketones, UA: NEGATIVE
Leukocytes,UA: NEGATIVE
Nitrite, UA: NEGATIVE
Protein,UA: NEGATIVE
RBC, UA: NEGATIVE
Specific Gravity, UA: 1.025 (ref 1.005–1.030)
Urobilinogen, Ur: 0.2 mg/dL (ref 0.2–1.0)
pH, UA: 6 (ref 5.0–7.5)

## 2018-10-26 LAB — BAYER DCA HB A1C WAIVED: HB A1C (BAYER DCA - WAIVED): 6.7 % (ref <7.0)

## 2018-10-26 LAB — MICROALBUMIN, URINE WAIVED
Creatinine, Urine Waived: 300 mg/dL (ref 10–300)
Microalb, Ur Waived: 80 mg/L — ABNORMAL HIGH (ref 0–19)

## 2018-10-26 MED ORDER — CYCLOBENZAPRINE HCL 10 MG PO TABS: 10.0000 mg | ORAL_TABLET | Freq: Every day | ORAL | 2 refills | Status: DC

## 2018-10-26 MED ORDER — NAPROXEN 500 MG PO TABS: 500.0000 mg | ORAL_TABLET | Freq: Two times a day (BID) | ORAL | 3 refills | Status: DC

## 2018-10-26 MED ORDER — AMOXICILLIN-POT CLAVULANATE 875-125 MG PO TABS: 1.0000 | ORAL_TABLET | Freq: Two times a day (BID) | ORAL | 0 refills | Status: DC

## 2018-10-26 MED ORDER — ATORVASTATIN CALCIUM 20 MG PO TABS: 20.0000 mg | ORAL_TABLET | Freq: Every day | ORAL | 1 refills | Status: DC

## 2018-10-26 MED ORDER — XIGDUO XR 5-1000 MG PO TB24: 1.0000 | ORAL_TABLET | Freq: Two times a day (BID) | ORAL | 1 refills | Status: DC

## 2018-10-26 NOTE — Assessment & Plan Note (Signed)
Under good control off medicine. Continue to monitor. Call with any concerns.

## 2018-10-26 NOTE — Assessment & Plan Note (Signed)
Under good control on current regimen. Continue current regimen. Continue to monitor. Call with any concerns. Refills given. Labs checked today.  

## 2018-10-26 NOTE — Assessment & Plan Note (Signed)
Doing much better with A1c down to 6.7 from 7.7. Continue current regimen. Continue to monitor. Call with any concerns.

## 2018-10-26 NOTE — Patient Instructions (Signed)
Health Maintenance, Male A healthy lifestyle and preventive care is important for your health and wellness. Ask your health care provider about what schedule of regular examinations is right for you. What should I know about weight and diet? Eat a Healthy Diet  Eat plenty of vegetables, fruits, whole grains, low-fat dairy products, and lean protein.  Do not eat a lot of foods high in solid fats, added sugars, or salt.  Maintain a Healthy Weight Regular exercise can help you achieve or maintain a healthy weight. You should:  Do at least 150 minutes of exercise each week. The exercise should increase your heart rate and make you sweat (moderate-intensity exercise).  Do strength-training exercises at least twice a week. Watch Your Levels of Cholesterol and Blood Lipids  Have your blood tested for lipids and cholesterol every 5 years starting at 42 years of age. If you are at high risk for heart disease, you should start having your blood tested when you are 42 years old. You may need to have your cholesterol levels checked more often if: ? Your lipid or cholesterol levels are high. ? You are older than 42 years of age. ? You are at high risk for heart disease. What should I know about cancer screening? Many types of cancers can be detected early and may often be prevented. Lung Cancer  You should be screened every year for lung cancer if: ? You are a current smoker who has smoked for at least 30 years. ? You are a former smoker who has quit within the past 15 years.  Talk to your health care provider about your screening options, when you should start screening, and how often you should be screened. Colorectal Cancer  Routine colorectal cancer screening usually begins at 42 years of age and should be repeated every 5-10 years until you are 42 years old. You may need to be screened more often if early forms of precancerous polyps or small growths are found. Your health care provider may  recommend screening at an earlier age if you have risk factors for colon cancer.  Your health care provider may recommend using home test kits to check for hidden blood in the stool.  A small camera at the end of a tube can be used to examine your colon (sigmoidoscopy or colonoscopy). This checks for the earliest forms of colorectal cancer. Prostate and Testicular Cancer  Depending on your age and overall health, your health care provider may do certain tests to screen for prostate and testicular cancer.  Talk to your health care provider about any symptoms or concerns you have about testicular or prostate cancer. Skin Cancer  Check your skin from head to toe regularly.  Tell your health care provider about any new moles or changes in moles, especially if: ? There is a change in a mole's size, shape, or color. ? You have a mole that is larger than a pencil eraser.  Always use sunscreen. Apply sunscreen liberally and repeat throughout the day.  Protect yourself by wearing long sleeves, pants, a wide-brimmed hat, and sunglasses when outside. What should I know about heart disease, diabetes, and high blood pressure?  If you are 18-39 years of age, have your blood pressure checked every 3-5 years. If you are 40 years of age or older, have your blood pressure checked every year. You should have your blood pressure measured twice-once when you are at a hospital or clinic, and once when you are not at a hospital   or clinic. Record the average of the two measurements. To check your blood pressure when you are not at a hospital or clinic, you can use: ? An automated blood pressure machine at a pharmacy. ? A home blood pressure monitor.  Talk to your health care provider about your target blood pressure.  If you are between 3-61 years old, ask your health care provider if you should take aspirin to prevent heart disease.  Have regular diabetes screenings by checking your fasting blood sugar  level. ? If you are at a normal weight and have a low risk for diabetes, have this test once every three years after the age of 56. ? If you are overweight and have a high risk for diabetes, consider being tested at a younger age or more often.  A one-time screening for abdominal aortic aneurysm (AAA) by ultrasound is recommended for men aged 28-75 years who are current or former smokers. What should I know about preventing infection? Hepatitis B If you have a higher risk for hepatitis B, you should be screened for this virus. Talk with your health care provider to find out if you are at risk for hepatitis B infection. Hepatitis C Blood testing is recommended for:  Everyone born from [birth year] through 1964.  Anyone with known risk factors for hepatitis C. Sexually Transmitted Diseases (STDs)  You should be screened each year for STDs including gonorrhea and chlamydia if: ? You are sexually active and are younger than 42 years of age. ? You are older than 42 years of age and your health care provider tells you that you are at risk for this type of infection. ? Your sexual activity has changed since you were last screened and you are at an increased risk for chlamydia or gonorrhea. Ask your health care provider if you are at risk.  Talk with your health care provider about whether you are at high risk of being infected with HIV. Your health care provider may recommend a prescription medicine to help prevent HIV infection. What else can I do?  Schedule regular health, dental, and eye exams.  Stay current with your vaccines (immunizations).  Do not use any tobacco products, such as cigarettes, chewing tobacco, and e-cigarettes. If you need help quitting, ask your health care provider.  Limit alcohol intake to no more than 2 drinks per day. One drink equals 12 ounces of beer, 5 ounces of wine, or 1 ounces of hard liquor.  Do not use street drugs.  Do not share needles.  Ask your health  care provider for help if you need support or information about quitting drugs.  Tell your health care provider if you often feel depressed.  Tell your health care provider if you have ever been abused or do not feel safe at home. This information is not intended to replace advice given to you by your health care provider. Make sure you discuss any questions you have with your health care provider. Document Released: 10/19/2007 Document Revised: 12/20/2015 Document Reviewed: 01/24/2015 Elsevier Interactive Patient Education  2019 [employer].  Hip Exercises Ask your health care provider which exercises are safe for you. Do exercises exactly as told by your health care provider and adjust them as directed. It is normal to feel mild stretching, pulling, tightness, or discomfort as you do these exercises, but you should stop right away if you feel sudden pain or your pain gets worse.Do not begin these exercises until told by your health care provider. Stretching  and range of motion exercises These exercises warm up your muscles and joints and improve the movement and flexibility of your hip. These exercises also help to relieve pain, numbness, and tingling. Exercise A: Hamstrings, supine  1. Lie on your back. 2. Loop a belt or towel over the ball of your left / rightfoot. The ball of your foot is on the walking surface, right under your toes. 3. Straighten your left / rightknee and slowly pull on the belt to raise your leg. ? Do not let your left / right knee bend while you do this. ? Keep your other leg flat on the floor. ? Raise the left / right leg until you feel a gentle stretch behind your left / right knee or thigh. 4. Hold this position for __________ seconds. 5. Slowly return your leg to the starting position. Repeat __________ times. Complete this stretch __________ times a day. Exercise B: Hip rotators  1. Lie on your back on a firm surface. 2. Hold your left / right knee with  your left / right hand. Hold your ankle with your other hand. 3. Gently pull your left / right knee and rotate your lower leg toward your other shoulder. ? Pull until you feel a stretch in your buttocks. ? Keep your hips and shoulders firmly planted while you do this stretch. 4. Hold this position for __________ seconds. Repeat __________ times. Complete this stretch __________ times a day. Exercise C: V-sit (hamstrings and adductors)  1. Sit on the floor with your legs extended in a large "V" shape. Keep your knees straight during this exercise. 2. Start with your head and chest upright, then bend at your waist to reach for your left foot (position A). You should feel a stretch in your right inner thigh. 3. Hold this position for __________ seconds. Then slowly return to the upright position. 4. Bend at your waist to reach forward (position B). You should feel a stretch behind both of your thighs and knees. 5. Hold this position for __________ seconds. Then slowly return to the upright position. 6. Bend at your waist to reach for your right foot (position C). You should feel a stretch in your left inner thigh. 7. Hold this position for __________ seconds. Then slowly return to the upright position. Repeat __________ times. Complete this stretch __________ times a day. Exercise D: Lunge (hip flexors)  1. Place your left / right knee on the floor and bend your other knee so that is directly over your ankle. You should be half-kneeling. 2. Keep good posture with your head over your shoulders. 3. Tighten your buttocks to point your tailbone downward. This helps your back to keep from arching too much. 4. You should feel a gentle stretch in the front of your left / right thigh and hip. If you do not feel any resistance, slightly slide your other foot forward and then slowly lunge forward so your knee once again lines up over your ankle. 5. Make sure your tailbone continues to point downward. 6.  Hold this position for __________ seconds. Repeat __________ times. Complete this stretch __________ times a day. Strengthening exercises These exercises build strength and endurance in your hip. Endurance is the ability to use your muscles for a long time, even after they get tired. Exercise E: Bridge (hip extensors)  1. Lie on your back on a firm surface with your knees bent and your feet flat on the floor. 2. Tighten your buttocks muscles and lift your  bottom off the floor until the trunk of your body is level with your thighs. ? Do not arch your back. ? You should feel the muscles working in your buttocks and the back of your thighs. If you do not feel these muscles, slide your feet 1-2 inches (2.5-5 cm) farther away from your buttocks. 3. Hold this position for __________ seconds. 4. Slowly lower your hips to the starting position. 5. Let your muscles relax completely between repetitions. 6. If this exercise is too easy, try doing it with your arms crossed over your chest. Repeat __________ times. Complete this exercise __________ times a day. Exercise F: Straight leg raises - hip abductors  1. Lie on your side with your left / right leg in the top position. Lie so your head, shoulder, knee, and hip line up with each other. You may bend your bottom knee to help you balance. 2. Roll your hips slightly forward, so your hips are stacked directly over each other and your left / right knee is facing forward. 3. Leading with your heel, lift your top leg 4-6 inches (10-15 cm). You should feel the muscles in your outer hip lifting. ? Do not let your foot drift forward. ? Do not let your knee roll toward the ceiling. 4. Hold this position for __________ seconds. 5. Slowly return to the starting position. 6. Let your muscles relax completely between repetitions. Repeat __________ times. Complete this exercise __________ times a day. Exercise G: Straight leg raises - hip adductors  1. Lie on  your side with your left / right leg in the bottom position. Lie so your head, shoulder, knee, and hip line up. You may place your upper foot in front to help you balance. 2. Roll your hips slightly forward, so your hips are stacked directly over each other and your left / right knee is facing forward. 3. Tense the muscles in your inner thigh and lift your bottom leg 4-6 inches (10-15 cm). 4. Hold this position for __________ seconds. 5. Slowly return to the starting position. 6. Let your muscles relax completely between repetitions. Repeat __________ times. Complete this exercise __________ times a day. Exercise H: Straight leg raises - quadriceps  1. Lie on your back with your left / right leg extended and your other knee bent. 2. Tense the muscles in the front of your left / right thigh. When you do this, you should see your kneecap slide up or see increased dimpling just above your knee. 3. Tighten these muscles even more and raise your leg 4-6 inches (10-15 cm) off the floor. 4. Hold this position for __________ seconds. 5. Keep these muscles tense as you lower your leg. 6. Relax the muscles slowly and completely between repetitions. Repeat __________ times. Complete this exercise __________ times a day. Exercise I: Hip abductors, standing 1. Tie one end of a rubber exercise band or tubing to a secure surface, such as a table or pole. 2. Loop the other end of the band or tubing around your left / right ankle. 3. Keeping your ankle with the band or tubing directly opposite of the secured end, step away until there is tension in the tubing or band. Hold onto a chair as needed for balance. 4. Lift your left / right leg out to your side. While you do this: ? Keep your back upright. ? Keep your shoulders over your hips. ? Keep your toes pointing forward. ? Make sure to use your hip muscles to lift  your leg. Do not "throw" your leg or tip your body to lift your leg. 5. Hold this position for  __________ seconds. 6. Slowly return to the starting position. Repeat __________ times. Complete this exercise __________ times a day. Exercise J: Squats (quadriceps) 1. Stand in a door frame so your feet and knees are in line with the frame. You may place your hands on the frame for balance. 2. Slowly bend your knees and lower your hips like you are going to sit in a chair. ? Keep your lower legs in a straight-up-and-down position. ? Do not let your hips go lower than your knees. ? Do not bend your knees lower than told by your health care provider. ? If your hip pain increases, do not bend as low. 3. Hold this position for ___________ seconds. 4. Slowly push with your legs to return to standing. Do not use your hands to pull yourself to standing. Repeat __________ times. Complete this exercise __________ times a day. This information is not intended to replace advice given to you by your health care provider. Make sure you discuss any questions you have with your health care provider. Document Released: 05/10/2005 Document Revised: 08/26/2017 Document Reviewed: 04/17/2015 Elsevier Interactive Patient Education  2019 [employer].

## 2018-10-27 LAB — CBC WITH DIFFERENTIAL/PLATELET
Basophils Absolute: 0.1 10*3/uL (ref 0.0–0.2)
Basos: 1 %
EOS (ABSOLUTE): 0.1 10*3/uL (ref 0.0–0.4)
Eos: 2 %
Hematocrit: 52.6 % — ABNORMAL HIGH (ref 37.5–51.0)
Hemoglobin: 17.1 g/dL (ref 13.0–17.7)
Immature Grans (Abs): 0 10*3/uL (ref 0.0–0.1)
Immature Granulocytes: 0 %
Lymphocytes Absolute: 2.2 10*3/uL (ref 0.7–3.1)
Lymphs: 23 %
MCH: 32.1 pg (ref 26.6–33.0)
MCHC: 32.5 g/dL (ref 31.5–35.7)
MCV: 99 fL — ABNORMAL HIGH (ref 79–97)
Monocytes Absolute: 0.6 10*3/uL (ref 0.1–0.9)
Monocytes: 6 %
Neutrophils Absolute: 6.6 10*3/uL (ref 1.4–7.0)
Neutrophils: 68 %
Platelets: 177 10*3/uL (ref 150–450)
RBC: 5.33 x10E6/uL (ref 4.14–5.80)
RDW: 13.9 % (ref 11.6–15.4)
WBC: 9.5 10*3/uL (ref 3.4–10.8)

## 2018-10-27 LAB — COMPREHENSIVE METABOLIC PANEL
ALT: 21 IU/L (ref 0–44)
AST: 13 IU/L (ref 0–40)
Albumin/Globulin Ratio: 1.7 (ref 1.2–2.2)
Albumin: 4.5 g/dL (ref 4.0–5.0)
Alkaline Phosphatase: 70 IU/L (ref 39–117)
BUN/Creatinine Ratio: 7 — ABNORMAL LOW (ref 9–20)
BUN: 7 mg/dL (ref 6–24)
Bilirubin Total: 1 mg/dL (ref 0.0–1.2)
CO2: 25 mmol/L (ref 20–29)
Calcium: 9.3 mg/dL (ref 8.7–10.2)
Chloride: 100 mmol/L (ref 96–106)
Creatinine, Ser: 1.05 mg/dL (ref 0.76–1.27)
GFR calc Af Amer: 101 mL/min/{1.73_m2} (ref >59)
GFR calc non Af Amer: 87 mL/min/{1.73_m2} (ref >59)
Globulin, Total: 2.6 g/dL (ref 1.5–4.5)
Glucose: 123 mg/dL — ABNORMAL HIGH (ref 65–99)
Potassium: 4.5 mmol/L (ref 3.5–5.2)
Sodium: 141 mmol/L (ref 134–144)
Total Protein: 7.1 g/dL (ref 6.0–8.5)

## 2018-10-27 LAB — LIPID PANEL W/O CHOL/HDL RATIO
Cholesterol, Total: 189 mg/dL (ref 100–199)
HDL: 33 mg/dL — ABNORMAL LOW (ref >39)
LDL Calculated: 107 mg/dL — ABNORMAL HIGH (ref 0–99)
Triglycerides: 245 mg/dL — ABNORMAL HIGH (ref 0–149)
VLDL Cholesterol Cal: 49 mg/dL — ABNORMAL HIGH (ref 5–40)

## 2018-10-27 LAB — TSH: TSH: 0.566 u[IU]/mL (ref 0.450–4.500)

## 2019-01-26 ENCOUNTER — Ambulatory Visit (INDEPENDENT_AMBULATORY_CARE_PROVIDER_SITE_OTHER): Payer: Commercial Managed Care - PPO | Admitting: Family Medicine

## 2019-01-26 ENCOUNTER — Encounter: Payer: Self-pay | Admitting: Family Medicine

## 2019-01-26 ENCOUNTER — Other Ambulatory Visit: Payer: Self-pay

## 2019-01-26 VITALS — BP 117/84 | HR 105 | Temp 98.5°F | Ht 69.0 in | Wt 231.0 lb

## 2019-01-26 DIAGNOSIS — M7711 Lateral epicondylitis, right elbow: Secondary | ICD-10-CM

## 2019-01-26 DIAGNOSIS — E119 Type 2 diabetes mellitus without complications: Secondary | ICD-10-CM

## 2019-01-26 LAB — BAYER DCA HB A1C WAIVED: HB A1C (BAYER DCA - WAIVED): 5.9 % (ref <7.0)

## 2019-01-26 NOTE — Patient Instructions (Signed)
Tennis Elbow Rehab °Ask your health care provider which exercises are safe for you. Do exercises exactly as told by your health care provider and adjust them as directed. It is normal to feel mild stretching, pulling, tightness, or discomfort as you do these exercises. Stop right away if you feel sudden pain or your pain gets worse. Do not begin these exercises until told by your health care provider. °Stretching and range-of-motion exercises °These exercises warm up your muscles and joints and improve the movement and flexibility of your elbow. These exercises also help to relieve pain, numbness, and tingling. °Wrist flexion, assisted ° °1. Straighten your left / right elbow in front of you with your palm facing down toward the floor. °? If told by your health care provider, bend your left / right elbow to a 90-degree angle (right angle) at your side. °2. With your other hand, gently push over the back of your left / right hand so your fingers point toward the floor (flexion). Stop when you feel a gentle stretch on the back of your forearm. °3. Hold this position for __________ seconds. °Repeat __________ times. Complete this exercise __________ times a day. °Wrist extension, assisted ° °1. Straighten your left / right elbow in front of you with your palm facing up toward the ceiling. °? If told by your health care provider, bend your left / right elbow to a 90-degree angle (right angle) at your side. °2. With your other hand, gently pull your left / right hand and fingers toward the floor (extension). Stop when you feel a gentle stretch on the palm side of your forearm. °3. Hold this position for __________ seconds. °Repeat __________ times. Complete this exercise __________ times a day. °Assisted forearm rotation, supination °1. Sit or stand with your left / right elbow bent to a 90-degree angle (right angle) at your side. °2. Using your uninjured hand, turn (rotate) your left / right palm up toward the ceiling  (supination) until you feel a gentle stretch along the inside of your forearm. °3. Hold this position for __________ seconds. °Repeat __________ times. Complete this exercise __________ times a day. °Assisted forearm rotation, pronation °1. Sit or stand with your left / right elbow bent to a 90-degree angle (right angle) at your side. °2. Using your uninjured hand, rotate your left / right palm down toward the floor (pronation) until you feel a gentle stretch along the outside of your forearm. °3. Hold this position for __________ seconds. °Repeat __________ times. Complete this exercise __________ times a day. °Strengthening exercises °These exercises build strength and endurance in your forearm and elbow. Endurance is the ability to use your muscles for a long time, even after they get tired. °Radial deviation ° °1. Stand with a __________ weight or a hammer in your left / right hand. Or, sit while holding a rubber exercise band or tubing, with your left / right forearm supported on a table or countertop. °? If you are standing, position your forearm so that your thumb is facing forward. If you are sitting, position your forearm so that the thumb is facing the ceiling. This is the neutral position. °2. Raise your hand upward in front of you so your thumb moves toward the ceiling (radial deviation), or pull up on the rubber tubing. Keep your forearm and elbow still while you move your wrist only. °3. Hold this position for __________ seconds. °4. Slowly return to the starting position. °Repeat __________ times. Complete this exercise __________ times   a day. Wrist extension, eccentric 1. Sit with your left / right forearm palm-down and supported on a table or other surface. Let your left / right wrist extend over the edge of the surface. 2. Hold a __________ weight or a piece of exercise band or tubing in your left / right hand. ? If using a rubber exercise band or tubing, hold the other end of the tubing with  your other hand. 3. Use your uninjured hand to move your left / right hand up toward the ceiling. 4. Take your uninjured hand away and slowly return to the starting position using only your left / right hand. Lowering your arm under tension is called eccentric extension. Repeat __________ times. Complete this exercise __________ times a day. Wrist extension Do not do this exercise if it causes pain at the outside of your elbow. Only do this exercise once instructed by your health care provider. 1. Sit with your left / right forearm supported on a table or other surface and your palm turned down toward the floor. Let your left / right wrist extend over the edge of the surface. 2. Hold a __________ weight or a piece of rubber exercise band or tubing. ? If you are using a rubber exercise band or tubing, hold the band or tubing in place with your other hand to provide resistance. 3. Slowly bend your wrist so your hand moves up toward the ceiling (extension). Move only your wrist, keeping your forearm and elbow still. 4. Hold this position for __________ seconds. 5. Slowly return to the starting position. Repeat __________ times. Complete this exercise __________ times a day. Forearm rotation, supination To do this exercise, you will need a lightweight hammer or rubber mallet. 1. Sit with your left / right forearm supported on a table or other surface. Bend your elbow to a 90-degree angle (right angle). Position your forearm so that your palm is facing down toward the floor, with your hand resting over the edge of the table. 2. Hold a hammer in your left / right hand. ? To make this exercise easier, hold the hammer near the head of the hammer. ? To make this exercise harder, hold the hammer near the end of the handle. 3. Without moving your wrist or elbow, slowly rotate your forearm so your palm faces up toward the ceiling (supination). 4. Hold this position for __________ seconds. 5. Slowly return  to the starting position. Repeat __________ times. Complete this exercise __________ times a day. Shoulder blade squeeze 1. Sit in a stable chair or stand with good posture. If you are sitting down, do not let your back touch the back of the chair. 2. Your arms should be at your sides with your elbows bent to a 90-degree angle (right angle). Position your forearms so that your thumbs are facing the ceiling (neutral position). 3. Without lifting your shoulders up, squeeze your shoulder blades tightly together. 4. Hold this position for __________ seconds. 5. Slowly release and return to the starting position. Repeat __________ times. Complete this exercise __________ times a day. This information is not intended to replace advice given to you by your health care provider. Make sure you discuss any questions you have with your health care provider. Document Released: 04/22/2005 Document Revised: 08/13/2018 Document Reviewed: 06/16/2018 Elsevier Patient Education  [employer].  Tennis Elbow  Tennis elbow (lateral epicondylitis) is inflammation of tendons in your outer forearm, near your elbow. Tendons are tissues that connect muscle to bone.  When you have tennis elbow, inflammation affects the tendons that you use to bend your wrist and move your hand up. Inflammation occurs in the lower part of the upper arm bone (humerus), where the tendons connect to the bone (lateral epicondyle). Tennis elbow often affects people who play tennis, but anyone may get the condition from repeatedly extending the wrist or turning the forearm. What are the causes? This condition is usually caused by repeatedly extending the wrist, turning the forearm, and using the hands. It can result from sports or work that requires repetitive forearm movements. In some cases, it may be caused by a sudden injury. What increases the risk? You are more likely to develop tennis elbow if you play tennis or another racket sport.  You also have a higher risk if you frequently use your hands for work. Besides people who play tennis, others at greater risk include:  Musicians.  Carpenters, painters, and plumbers.  Cooks.  Cashiers.  People who work in [employer].  Architect workers.  Butchers.  People who use computers. What are the signs or symptoms? Symptoms of this condition include:  Pain and tenderness in the forearm and the outer part of the elbow. Pain may be felt only when using the arm, or it may be there all the time.  A burning feeling that starts in the elbow and spreads down the forearm.  A weak grip in the hand. How is this diagnosed? This condition may be diagnosed based on:  Your symptoms and medical history.  A physical exam.  X-rays.  MRI. How is this treated? Resting and icing your arm is often the first treatment. Your health care provider may also recommend:  Medicines to reduce pain and inflammation. These may be in the form of a pill, topical gels, or shots of a steroid medicine (cortisone).  An elbow strap to reduce stress on the area.  Physical therapy. This may include massage or exercises.  An elbow brace to restrict the movements that cause symptoms. If these treatments do not help relieve your symptoms, your health care provider may recommend surgery to remove damaged muscle and reattach healthy muscle to bone. Follow these instructions at home: Activity  Rest your elbow and wrist and avoid activities that cause symptoms, as told by your health care provider.  Do physical therapy exercises as instructed.  If you lift an object, lift it with your palm facing up. This reduces stress on your elbow. Lifestyle  If your tennis elbow is caused by sports, check your equipment and make sure that: ? You are using it correctly. ? It is the best fit for you.  If your tennis elbow is caused by work or computer use, take frequent breaks to stretch your arm. Talk with  your manager about ways to manage your condition at work. If you have a brace:  Wear the brace or strap as told by your health care provider. Remove it only as told by your health care provider.  Loosen the brace if your fingers tingle, become numb, or turn cold and blue.  Keep the brace clean.  If the brace is not waterproof, ask if you may remove it for bathing. If you must keep the brace on while bathing: ? Do not let it get wet. ? Cover it with a watertight covering when you take a bath or a shower. General instructions   If directed, put ice on the painful area: ? Put ice in a plastic bag. ? Place  a towel between your skin and the bag. ? Leave the ice on for 20 minutes, 2-3 times a day.  Take over-the-counter and prescription medicines only as told by your health care provider.  Keep all follow-up visits as told by your health care provider. This is important. Contact a health care provider if:  You have pain that gets worse or does not get better with treatment.  You have numbness or weakness in your forearm, hand, or fingers. Summary  Tennis elbow (lateral epicondylitis) is inflammation of tendons in your outer forearm, near your elbow.  Common symptoms include pain and tenderness in your forearm and the outer part of your elbow.  This condition is usually caused by repeatedly extending your wrist, turning your forearm, and using your hands.  The first treatment is often resting and icing your arm to relieve symptoms. Further treatment may include taking medicine, getting physical therapy, wearing a brace or strap, or having surgery. This information is not intended to replace advice given to you by your health care provider. Make sure you discuss any questions you have with your health care provider. Document Released: 04/22/2005 Document Revised: 01/16/2018 Document Reviewed: 02/04/2017 Elsevier Patient Education  2020 [employer].

## 2019-01-26 NOTE — Progress Notes (Signed)
BP 117/84   Pulse (!) 105   Temp 98.5 F (36.9 C) (Oral)   Ht 5\' 9"  (1.753 m)   Wt 231 lb (104.8 kg)   SpO2 95%   BMI 34.11 kg/m    Subjective:    Patient ID: Casey Cole, male    DOB: 1975/06/18, 43 y.o.   MRN: [id number]  HPI: Casey Cole is a 43 y.o. male  Chief Complaint  Patient presents with  . Diabetes  . Arm Pain    right forearm x about 3 weeks now   DIABETES Hypoglycemic episodes:no Polydipsia/polyuria: no Visual disturbance: no Chest pain: no Paresthesias: no Glucose Monitoring: no  Accucheck frequency: Not Checking  Fasting glucose: Taking Insulin?: no Blood Pressure Monitoring: not checking Retinal Examination: Not up to Date Foot Exam: Done today Diabetic Education: Completed Pneumovax: Up to Date Influenza: Declined Aspirin: no  ARM PAIN Duration: 3 weeks Location: right elbow and arm Mechanism of injury: unknown Onset: sudden Severity: severe  Quality:  Aching, shooting Frequency: constant Radiation: down his arm Aggravating factors:moving it, mowing   Alleviating factors: ice, NSAIDs, brace and rest  Status: worse Treatments attempted: rest, ice, heat, APAP, ibuprofen and aleve  Relief with NSAIDs?:  mild Swelling: yes Redness: no  Warmth: no Trauma: no Chest pain: no  Shortness of breath: no  Fever: no Decreased sensation: no Paresthesias: yes Weakness: yes  Relevant past medical, surgical, family and social history reviewed and updated as indicated. Interim medical history since our last visit reviewed. Allergies and medications reviewed and updated.  Review of Systems  Constitutional: Negative.   Cardiovascular: Negative.   Musculoskeletal: Positive for arthralgias and myalgias. Negative for back pain, gait problem, joint swelling, neck pain and neck stiffness.  Skin: Negative.   Neurological: Negative.   Psychiatric/Behavioral: Negative.     Per HPI unless specifically indicated above     Objective:    BP  117/84   Pulse (!) 105   Temp 98.5 F (36.9 C) (Oral)   Ht 5\' 9"  (1.753 m)   Wt 231 lb (104.8 kg)   SpO2 95%   BMI 34.11 kg/m   Wt Readings from Last 3 Encounters:  01/26/19 231 lb (104.8 kg)  10/26/18 241 lb (109.3 kg)  09/10/18 248 lb (112.5 kg)    Physical Exam Vitals signs and nursing note reviewed.  Constitutional:      General: He is not in acute distress.    Appearance: Normal appearance. He is not ill-appearing, toxic-appearing or diaphoretic.  HENT:     Head: Normocephalic and atraumatic.     Right Ear: External ear normal.     Left Ear: External ear normal.     Nose: Nose normal.     Mouth/Throat:     Mouth: Mucous membranes are moist.     Pharynx: Oropharynx is clear.  Eyes:     General: No scleral icterus.       Right eye: No discharge.        Left eye: No discharge.     Extraocular Movements: Extraocular movements intact.     Conjunctiva/sclera: Conjunctivae normal.     Pupils: Pupils are equal, round, and reactive to light.  Neck:     Musculoskeletal: Normal range of motion and neck supple.  Cardiovascular:     Rate and Rhythm: Normal rate and regular rhythm.     Pulses: Normal pulses.     Heart sounds: Normal heart sounds. No murmur. No friction rub. No gallop.  Pulmonary:     Effort: Pulmonary effort is normal. No respiratory distress.     Breath sounds: Normal breath sounds. No stridor. No wheezing, rhonchi or rales.  Chest:     Chest wall: No tenderness.  Musculoskeletal: Normal range of motion.        General: Tenderness (tenderness on R lateral epicondyle, mild swelling of R brachioradialis) present.  Skin:    General: Skin is warm and dry.     Capillary Refill: Capillary refill takes less than 2 seconds.     Coloration: Skin is not jaundiced or pale.     Findings: No bruising, erythema, lesion or rash.  Neurological:     General: No focal deficit present.     Mental Status: He is alert and oriented to person, place, and time. Mental status  is at baseline.  Psychiatric:        Mood and Affect: Mood normal.        Behavior: Behavior normal.        Thought Content: Thought content normal.        Judgment: Judgment normal.     Results for orders placed or performed in visit on 10/26/18  Bayer DCA Hb A1c Waived  Result Value Ref Range   HB A1C (BAYER DCA - WAIVED) 6.7 <7.0 %  CBC with Differential/Platelet  Result Value Ref Range   WBC 9.5 3.4 - 10.8 x10E3/uL   RBC 5.33 4.14 - 5.80 x10E6/uL   Hemoglobin 17.1 13.0 - 17.7 g/dL   Hematocrit 52.6 (H) 37.5 - 51.0 %   MCV 99 (H) 79 - 97 fL   MCH 32.1 26.6 - 33.0 pg   MCHC 32.5 31.5 - 35.7 g/dL   RDW 13.9 11.6 - 15.4 %   Platelets 177 150 - 450 x10E3/uL   Neutrophils 68 Not Estab. %   Lymphs 23 Not Estab. %   Monocytes 6 Not Estab. %   Eos 2 Not Estab. %   Basos 1 Not Estab. %   Neutrophils Absolute 6.6 1.4 - 7.0 x10E3/uL   Lymphocytes Absolute 2.2 0.7 - 3.1 x10E3/uL   Monocytes Absolute 0.6 0.1 - 0.9 x10E3/uL   EOS (ABSOLUTE) 0.1 0.0 - 0.4 x10E3/uL   Basophils Absolute 0.1 0.0 - 0.2 x10E3/uL   Immature Granulocytes 0 Not Estab. %   Immature Grans (Abs) 0.0 0.0 - 0.1 x10E3/uL  Comprehensive metabolic panel  Result Value Ref Range   Glucose 123 (H) 65 - 99 mg/dL   BUN 7 6 - 24 mg/dL   Creatinine, Ser 1.05 0.76 - 1.27 mg/dL   GFR calc non Af Amer 87 >59 mL/min/1.73   GFR calc Af Amer 101 >59 mL/min/1.73   BUN/Creatinine Ratio 7 (L) 9 - 20   Sodium 141 134 - 144 mmol/L   Potassium 4.5 3.5 - 5.2 mmol/L   Chloride 100 96 - 106 mmol/L   CO2 25 20 - 29 mmol/L   Calcium 9.3 8.7 - 10.2 mg/dL   Total Protein 7.1 6.0 - 8.5 g/dL   Albumin 4.5 4.0 - 5.0 g/dL   Globulin, Total 2.6 1.5 - 4.5 g/dL   Albumin/Globulin Ratio 1.7 1.2 - 2.2   Bilirubin Total 1.0 0.0 - 1.2 mg/dL   Alkaline Phosphatase 70 39 - 117 IU/L   AST 13 0 - 40 IU/L   ALT 21 0 - 44 IU/L  Lipid Panel w/o Chol/HDL Ratio  Result Value Ref Range   Cholesterol, Total 189 100 - 199 mg/dL  Triglycerides 245  (H) 0 - 149 mg/dL   HDL 33 (L) >39 mg/dL   VLDL Cholesterol Cal 49 (H) 5 - 40 mg/dL   LDL Calculated 107 (H) 0 - 99 mg/dL  Microalbumin, Urine Waived  Result Value Ref Range   Microalb, Ur Waived 80 (H) 0 - 19 mg/L   Creatinine, Urine Waived 300 10 - 300 mg/dL   Microalb/Creat Ratio 30-300 (H) <30 mg/g  TSH  Result Value Ref Range   TSH 0.566 0.450 - 4.500 uIU/mL  UA/M w/rflx Culture, Routine   Specimen: Blood   BLD  Result Value Ref Range   Specific Gravity, UA 1.025 1.005 - 1.030   pH, UA 6.0 5.0 - 7.5   Color, UA Yellow Yellow   Appearance Ur Clear Clear   Leukocytes,UA Negative Negative   Protein,UA Negative Negative/Trace   Glucose, UA Negative Negative   Ketones, UA Negative Negative   RBC, UA Negative Negative   Bilirubin, UA Negative Negative   Urobilinogen, Ur 0.2 0.2 - 1.0 mg/dL   Nitrite, UA Negative Negative      Assessment & Plan:   Problem List Items Addressed This Visit      Endocrine   Diabetes mellitus without complication ([place]) - Primary    Continues to improve with A1c of 5.9 down from 6.7. Continue current regimen. Continue to monitor. Call with any concerns.       Relevant Orders   Bayer DCA Hb A1c Waived    Other Visit Diagnoses    Lateral epicondylitis of right elbow       Not getting better with conservative therapy. Injected today as below.       Procedure: Right Lateral Epicondylitis Steroid Injection        Diagnosis:   ICD-10-CM   1. Diabetes mellitus without complication ([place])  [id number] Bayer DCA Hb A1c Waived  2. Lateral epicondylitis of right elbow  M77.11    Not getting better with conservative therapy. Injected today as below.     Physician: MJ Consent:  Risks, benefits, and alternative treatments discussed and all questions were answered.  Patient elected to proceed and verbal consent obtained.  Description: Area prepped and draped using  semi-sterile technique.  After identifying area of maximal tenderness, A mixture of 1.5cc  1% lidocaine and 0.5cc Kenalog 40 was injected.  A bandage was then placed over the injection site. Complications: none Post Procedure Instructions: Wound care instructions discussed and patient was instructed to keep area clean and dry.  Signs and symptoms of infection discussed, patient agrees to contact the office ASAP should they occur.  Follow Up: Return in about 3 months (around 04/27/2019) for 6 month follow up.   Follow up plan: Return in about 3 months (around 04/27/2019) for 6 month follow up.

## 2019-01-26 NOTE — Assessment & Plan Note (Signed)
Continues to improve with A1c of 5.9 down from 6.7. Continue current regimen. Continue to monitor. Call with any concerns.

## 2019-01-28 ENCOUNTER — Encounter: Payer: Self-pay | Admitting: Family Medicine

## 2019-01-28 ENCOUNTER — Other Ambulatory Visit: Payer: Self-pay | Admitting: Family Medicine

## 2019-01-28 DIAGNOSIS — Z20822 Contact with and (suspected) exposure to covid-19: Secondary | ICD-10-CM

## 2019-01-28 DIAGNOSIS — Z20828 Contact with and (suspected) exposure to other viral communicable diseases: Secondary | ICD-10-CM

## 2019-02-01 ENCOUNTER — Other Ambulatory Visit: Payer: Self-pay

## 2019-02-01 DIAGNOSIS — Z20822 Contact with and (suspected) exposure to covid-19: Secondary | ICD-10-CM

## 2019-02-02 LAB — NOVEL CORONAVIRUS, NAA: SARS-CoV-2, NAA: NOT DETECTED

## 2019-02-09 ENCOUNTER — Encounter: Payer: Self-pay | Admitting: Family Medicine

## 2019-02-18 ENCOUNTER — Encounter: Payer: Self-pay | Admitting: Family Medicine

## 2019-02-18 ENCOUNTER — Ambulatory Visit
Admission: RE | Admit: 2019-02-18 | Discharge: 2019-02-18 | Disposition: A | Discharge status: Home / Self Care | Payer: Commercial Managed Care - PPO | Source: Ambulatory Visit | Attending: Family Medicine | Admitting: Family Medicine

## 2019-02-18 ENCOUNTER — Ambulatory Visit
Admission: RE | Admit: 2019-02-18 | Discharge: 2019-02-18 | Disposition: A | Discharge status: Home / Self Care | Payer: Commercial Managed Care - PPO | Attending: Family Medicine | Admitting: Family Medicine

## 2019-02-18 ENCOUNTER — Ambulatory Visit (INDEPENDENT_AMBULATORY_CARE_PROVIDER_SITE_OTHER): Payer: Commercial Managed Care - PPO | Admitting: Family Medicine

## 2019-02-18 ENCOUNTER — Other Ambulatory Visit: Payer: Self-pay

## 2019-02-18 VITALS — BP 134/93 | HR 107 | Temp 99.0°F | Ht 69.0 in | Wt 230.0 lb

## 2019-02-18 DIAGNOSIS — R202 Paresthesia of skin: Secondary | ICD-10-CM

## 2019-02-18 DIAGNOSIS — L0591 Pilonidal cyst without abscess: Secondary | ICD-10-CM

## 2019-02-18 DIAGNOSIS — M79601 Pain in right arm: Secondary | ICD-10-CM

## 2019-02-18 MED ORDER — SULFAMETHOXAZOLE-TRIMETHOPRIM 800-160 MG PO TABS: 1.0000 | ORAL_TABLET | Freq: Two times a day (BID) | ORAL | 0 refills | Status: DC

## 2019-02-18 MED ORDER — GABAPENTIN 100 MG PO CAPS: 100.0000 mg | ORAL_CAPSULE | Freq: Three times a day (TID) | ORAL | 3 refills | Status: DC

## 2019-02-18 NOTE — Progress Notes (Signed)
BP (!) 134/93   Pulse (!) 107   Temp 99 F (37.2 C) (Oral)   Ht 5\' 9"  (1.753 m)   Wt 230 lb (104.3 kg)   SpO2 95%   BMI 33.97 kg/m    Subjective:    Patient ID: Casey Cole, male    DOB: [birth year], 43 y.o.   MRN: [id number]  HPI: Casey Cole is a 43 y.o. male  Chief Complaint  Patient presents with  . Arm Pain    right. pt states that his arm still hurting and shaking   ARM PAIN Duration: about a month and a half Location: right forearm Mechanism of injury: unknown Onset: sudden Severity: moderate  Quality:  Shooting, throbbing and numb Frequency: constant Radiation: yes Aggravating factors: lifting and movement  Alleviating factors: nothing   Status: worse Treatments attempted: steroid injection, rest, ice, heat, APAP, ibuprofen, aleve and HEP  Relief with NSAIDs?:  no Swelling: no Redness: no  Warmth: no Trauma: no Chest pain: no  Shortness of breath: no  Fever: no Decreased sensation: yes Paresthesias: yes Weakness: yes   SKIN INFECTION Duration: couple of weeks Location: buttock History of trauma in area: yes Pain: yes Quality: aching Severity: moderate Redness: yes Swelling: yes Oozing: yes Pus: yes Fevers: no Nausea/vomiting: no Status: better Treatments attempted:warm compresses  Tetanus: UTD  Relevant past medical, surgical, family and social history reviewed and updated as indicated. Interim medical history since our last visit reviewed. Allergies and medications reviewed and updated.  Review of Systems  Constitutional: Negative.   Respiratory: Negative.   Cardiovascular: Negative.   Musculoskeletal: Positive for myalgias. Negative for arthralgias, back pain, gait problem, joint swelling, neck pain and neck stiffness.  Skin: Negative.   Neurological: Positive for tremors, weakness and numbness. Negative for dizziness, seizures, syncope, facial asymmetry, speech difficulty, light-headedness and headaches.  Psychiatric/Behavioral:  Negative.     Per HPI unless specifically indicated above     Objective:    BP (!) 134/93   Pulse (!) 107   Temp 99 F (37.2 C) (Oral)   Ht 5\' 9"  (1.753 m)   Wt 230 lb (104.3 kg)   SpO2 95%   BMI 33.97 kg/m   Wt Readings from Last 3 Encounters:  02/18/19 230 lb (104.3 kg)  01/26/19 231 lb (104.8 kg)  10/26/18 241 lb (109.3 kg)    Physical Exam Vitals signs and nursing note reviewed.  Constitutional:      General: He is not in acute distress.    Appearance: Normal appearance. He is not ill-appearing, toxic-appearing or diaphoretic.  HENT:     Head: Normocephalic and atraumatic.     Right Ear: External ear normal.     Left Ear: External ear normal.     Nose: Nose normal.     Mouth/Throat:     Mouth: Mucous membranes are moist.     Pharynx: Oropharynx is clear.  Eyes:     General: No scleral icterus.       Right eye: No discharge.        Left eye: No discharge.     Extraocular Movements: Extraocular movements intact.     Conjunctiva/sclera: Conjunctivae normal.     Pupils: Pupils are equal, round, and reactive to light.  Neck:     Musculoskeletal: Normal range of motion and neck supple.  Cardiovascular:     Rate and Rhythm: Normal rate and regular rhythm.     Pulses: Normal pulses.     Heart sounds: Normal  heart sounds. No murmur. No friction rub. No gallop.   Pulmonary:     Effort: Pulmonary effort is normal. No respiratory distress.     Breath sounds: Normal breath sounds. No stridor. No wheezing, rhonchi or rales.  Chest:     Chest wall: No tenderness.  Musculoskeletal: Normal range of motion.  Skin:    General: Skin is warm and dry.     Capillary Refill: Capillary refill takes less than 2 seconds.     Coloration: Skin is not jaundiced or pale.     Findings: Erythema present. No bruising, lesion or rash.  Neurological:     General: No focal deficit present.     Mental Status: He is alert and oriented to person, place, and time. Mental status is at  baseline.  Psychiatric:        Mood and Affect: Mood normal.        Behavior: Behavior normal.        Thought Content: Thought content normal.        Judgment: Judgment normal.     Results for orders placed or performed in visit on 02/01/19  Novel Coronavirus, NAA ([facility])   Specimen: Oropharyngeal(OP) collection in vial transport medium   OROPHARYNGEA  TESTING  Result Value Ref Range   SARS-CoV-2, NAA Not Detected Not Detected      Assessment & Plan:   Problem List Items Addressed This Visit    None    Visit Diagnoses    Right arm pain    -  Primary   Will obtain x-rays to look for structural causes. Await results. Call with any concerns.    Relevant Orders   DG Cervical Spine Complete (Completed)   DG Shoulder Right (Completed)   DG Forearm Right (Completed)   Paresthesias       Will obtain x-rays to look for structural causes. Await results. Call with any concerns.    Relevant Orders   DG Cervical Spine Complete (Completed)   DG Shoulder Right (Completed)   DG Forearm Right (Completed)   Infected pilonidal cyst       Will treat with bactrim. Call if not doing better. Continue to monitor. Call with any concerns.   Relevant Medications   sulfamethoxazole-trimethoprim (BACTRIM DS) 800-160 MG tablet       Follow up plan: Return if symptoms worsen or fail to improve.

## 2019-05-03 ENCOUNTER — Ambulatory Visit: Payer: Commercial Managed Care - PPO | Admitting: Family Medicine

## 2019-05-10 ENCOUNTER — Encounter: Payer: Self-pay | Admitting: Family Medicine

## 2019-05-10 ENCOUNTER — Ambulatory Visit (INDEPENDENT_AMBULATORY_CARE_PROVIDER_SITE_OTHER): Payer: Commercial Managed Care - PPO | Admitting: Family Medicine

## 2019-05-10 ENCOUNTER — Other Ambulatory Visit: Payer: Self-pay

## 2019-05-10 VITALS — BP 117/85 | HR 101 | Temp 98.1°F | Ht 69.0 in | Wt 240.0 lb

## 2019-05-10 DIAGNOSIS — E119 Type 2 diabetes mellitus without complications: Secondary | ICD-10-CM

## 2019-05-10 DIAGNOSIS — I129 Hypertensive chronic kidney disease with stage 1 through stage 4 chronic kidney disease, or unspecified chronic kidney disease: Secondary | ICD-10-CM

## 2019-05-10 DIAGNOSIS — F3342 Major depressive disorder, recurrent, in full remission: Secondary | ICD-10-CM

## 2019-05-10 DIAGNOSIS — M7711 Lateral epicondylitis, right elbow: Secondary | ICD-10-CM

## 2019-05-10 DIAGNOSIS — E782 Mixed hyperlipidemia: Secondary | ICD-10-CM

## 2019-05-10 LAB — BAYER DCA HB A1C WAIVED: HB A1C (BAYER DCA - WAIVED): 6.3 % (ref <7.0)

## 2019-05-10 MED ORDER — METFORMIN HCL ER (OSM) 1000 MG PO TB24: 1000.0000 mg | ORAL_TABLET | Freq: Every day | ORAL | 1 refills | Status: DC

## 2019-05-10 MED ORDER — ATORVASTATIN CALCIUM 20 MG PO TABS: ORAL_TABLET | ORAL | 0 refills | Status: DC

## 2019-05-10 MED ORDER — OZEMPIC (1 MG/DOSE) 2 MG/1.5ML ~~LOC~~ SOPN: 1.0000 mg | PEN_INJECTOR | SUBCUTANEOUS | 1 refills | Status: DC

## 2019-05-10 NOTE — Assessment & Plan Note (Signed)
Doing well with A1c of 6.3- has not been taking his xigduo because it tears up his stomach. Will change to metformin and continue ozempic. Call with any concerns. Recheck 3 months.

## 2019-05-10 NOTE — Assessment & Plan Note (Signed)
Under good control on current regimen. Continue current regimen. Continue to monitor. Call with any concerns. Labs with any concerns.

## 2019-05-10 NOTE — Progress Notes (Signed)
BP 117/85   Pulse (!) 101   Temp 98.1 F (36.7 C) (Oral)   Ht '5\' 9"'$  (1.753 m)   Wt 240 lb (108.9 kg)   SpO2 95%   BMI 35.44 kg/m    Subjective:    Patient ID: Casey Cole, male    DOB: 02-19-1976, 44 y.o.   MRN: [id number]  HPI: Casey Cole is a 44 y.o. male  Chief Complaint  Patient presents with  . Diabetes   DIABETES- has not been taking his xigduo as it has been tearing up his stomach for the past 2 months Hypoglycemic episodes:no Polydipsia/polyuria: no Visual disturbance: no Chest pain: no Paresthesias: no Glucose Monitoring: no Taking Insulin?: no Blood Pressure Monitoring: not checking Retinal Examination: Not Up to Date Foot Exam: Up to Date Diabetic Education: Completed Pneumovax: Up to Date Influenza: Up to Date Aspirin: no  Relevant past medical, surgical, family and social history reviewed and updated as indicated. Interim medical history since our last visit reviewed. Allergies and medications reviewed and updated.  Review of Systems  Constitutional: Negative.   Respiratory: Negative.   Cardiovascular: Negative.   Musculoskeletal: Positive for myalgias. Negative for arthralgias, back pain, gait problem, joint swelling, neck pain and neck stiffness.  Skin: Negative.   Neurological: Negative.   Psychiatric/Behavioral: Negative.     Per HPI unless specifically indicated above     Objective:    BP 117/85   Pulse (!) 101   Temp 98.1 F (36.7 C) (Oral)   Ht '5\' 9"'$  (1.753 m)   Wt 240 lb (108.9 kg)   SpO2 95%   BMI 35.44 kg/m   Wt Readings from Last 3 Encounters:  05/10/19 240 lb (108.9 kg)  02/18/19 230 lb (104.3 kg)  01/26/19 231 lb (104.8 kg)    Physical Exam Vitals and nursing note reviewed.  Constitutional:      General: He is not in acute distress.    Appearance: Normal appearance. He is not ill-appearing, toxic-appearing or diaphoretic.  HENT:     Head: Normocephalic and atraumatic.     Right Ear: External ear normal.   Left Ear: External ear normal.     Nose: Nose normal.     Mouth/Throat:     Mouth: Mucous membranes are moist.     Pharynx: Oropharynx is clear.  Eyes:     General: No scleral icterus.       Right eye: No discharge.        Left eye: No discharge.     Extraocular Movements: Extraocular movements intact.     Conjunctiva/sclera: Conjunctivae normal.     Pupils: Pupils are equal, round, and reactive to light.  Cardiovascular:     Rate and Rhythm: Normal rate and regular rhythm.     Pulses: Normal pulses.     Heart sounds: Normal heart sounds. No murmur. No friction rub. No gallop.   Pulmonary:     Effort: Pulmonary effort is normal. No respiratory distress.     Breath sounds: Normal breath sounds. No stridor. No wheezing, rhonchi or rales.  Chest:     Chest wall: No tenderness.  Musculoskeletal:        General: Normal range of motion.     Cervical back: Normal range of motion and neck supple.  Skin:    General: Skin is warm and dry.     Capillary Refill: Capillary refill takes less than 2 seconds.     Coloration: Skin is not jaundiced or pale.  Findings: No bruising, erythema, lesion or rash.  Neurological:     General: No focal deficit present.     Mental Status: He is alert and oriented to person, place, and time. Mental status is at baseline.  Psychiatric:        Mood and Affect: Mood normal.        Behavior: Behavior normal.        Thought Content: Thought content normal.        Judgment: Judgment normal.     Results for orders placed or performed in visit on 02/01/19  Novel Coronavirus, NAA ([facility])   Specimen: Oropharyngeal(OP) collection in vial transport medium   OROPHARYNGEA  TESTING  Result Value Ref Range   SARS-CoV-2, NAA Not Detected Not Detected      Assessment & Plan:   Problem List Items Addressed This Visit      Endocrine   Diabetes mellitus without complication ([place]) - Primary    Doing well with A1c of 6.3- has not been taking his xigduo because  it tears up his stomach. Will change to metformin and continue ozempic. Call with any concerns. Recheck 3 months.       Relevant Medications   metformin (FORTAMET) 1000 MG (OSM) 24 hr tablet   atorvastatin (LIPITOR) 20 MG tablet   Semaglutide, 1 MG/DOSE, (OZEMPIC, 1 MG/DOSE,) 2 MG/1.5ML SOPN   Other Relevant Orders   Bayer DCA Hb A1c Waived   Comp Met (CMET)   Lipid Panel w/o Chol/HDL Ratio OUT     Genitourinary   Benign hypertensive renal disease    Under good control on current regimen. Continue current regimen. Continue to monitor. Call with any concerns. Labs with any concerns.        Relevant Orders   Comp Met (CMET)     Other   Hyperlipidemia    Doesn't remember to take it- will take 1-2x a week. Refill for 1 year given today.       Relevant Medications   atorvastatin (LIPITOR) 20 MG tablet   Other Relevant Orders   Lipid Panel w/o Chol/HDL Ratio OUT   Recurrent major depressive disorder, in full remission ([place])    Stable off medicine. Continue to monitor. Call with any concerns.        Other Visit Diagnoses    Lateral epicondylitis of right elbow       Not getting any better. Would like to see ortho. Referral generated today.   Relevant Orders   Ambulatory referral to Orthopedic Surgery       Follow up plan: Return in about 3 months (around 08/08/2019).

## 2019-05-10 NOTE — Assessment & Plan Note (Signed)
Stable off medicine. Continue to monitor. Call with any concerns.

## 2019-05-10 NOTE — Assessment & Plan Note (Signed)
Doesn't remember to take it- will take 1-2x a week. Refill for 1 year given today.

## 2019-05-11 ENCOUNTER — Encounter: Payer: Self-pay | Admitting: Family Medicine

## 2019-05-11 ENCOUNTER — Telehealth: Payer: Self-pay

## 2019-05-11 LAB — LIPID PANEL W/O CHOL/HDL RATIO
Cholesterol, Total: 229 mg/dL — ABNORMAL HIGH (ref 100–199)
HDL: 34 mg/dL — ABNORMAL LOW (ref >39)
LDL Chol Calc (NIH): 135 mg/dL — ABNORMAL HIGH (ref 0–99)
Triglycerides: 334 mg/dL — ABNORMAL HIGH (ref 0–149)
VLDL Cholesterol Cal: 60 mg/dL — ABNORMAL HIGH (ref 5–40)

## 2019-05-11 LAB — COMPREHENSIVE METABOLIC PANEL
ALT: 17 IU/L (ref 0–44)
AST: 12 IU/L (ref 0–40)
Albumin/Globulin Ratio: 1.7 (ref 1.2–2.2)
Albumin: 4.5 g/dL (ref 4.0–5.0)
Alkaline Phosphatase: 76 IU/L (ref 39–117)
BUN/Creatinine Ratio: 11 (ref 9–20)
BUN: 11 mg/dL (ref 6–24)
Bilirubin Total: 1.1 mg/dL (ref 0.0–1.2)
CO2: 24 mmol/L (ref 20–29)
Calcium: 9.7 mg/dL (ref 8.7–10.2)
Chloride: 99 mmol/L (ref 96–106)
Creatinine, Ser: 0.99 mg/dL (ref 0.76–1.27)
GFR calc Af Amer: 108 mL/min/{1.73_m2} (ref >59)
GFR calc non Af Amer: 94 mL/min/{1.73_m2} (ref >59)
Globulin, Total: 2.6 g/dL (ref 1.5–4.5)
Glucose: 124 mg/dL — ABNORMAL HIGH (ref 65–99)
Potassium: 4 mmol/L (ref 3.5–5.2)
Sodium: 137 mmol/L (ref 134–144)
Total Protein: 7.1 g/dL (ref 6.0–8.5)

## 2019-05-11 NOTE — Telephone Encounter (Signed)
PA for Metformin submitted and approved via Cover My Meds. Key: [id number]

## 2019-05-12 DIAGNOSIS — M5412 Radiculopathy, cervical region: Secondary | ICD-10-CM | POA: Insufficient documentation

## 2019-05-12 DIAGNOSIS — G5603 Carpal tunnel syndrome, bilateral upper limbs: Secondary | ICD-10-CM | POA: Insufficient documentation

## 2019-05-12 DIAGNOSIS — M7711 Lateral epicondylitis, right elbow: Secondary | ICD-10-CM | POA: Insufficient documentation

## 2019-07-15 ENCOUNTER — Encounter: Payer: Self-pay | Admitting: Family Medicine

## 2019-07-15 ENCOUNTER — Ambulatory Visit (INDEPENDENT_AMBULATORY_CARE_PROVIDER_SITE_OTHER): Payer: Commercial Managed Care - PPO | Admitting: Family Medicine

## 2019-07-15 ENCOUNTER — Other Ambulatory Visit: Payer: Self-pay

## 2019-07-15 VITALS — BP 124/84 | HR 105 | Temp 98.5°F | Ht 69.0 in | Wt 251.0 lb

## 2019-07-15 DIAGNOSIS — M791 Myalgia, unspecified site: Secondary | ICD-10-CM | POA: Diagnosis not present

## 2019-07-15 MED ORDER — CYCLOBENZAPRINE HCL 10 MG PO TABS: 10.0000 mg | ORAL_TABLET | Freq: Three times a day (TID) | ORAL | 0 refills | Status: DC | PRN

## 2019-07-15 NOTE — Progress Notes (Signed)
BP 124/84   Pulse (!) 105   Temp 98.5 F (36.9 C) (Oral)   Ht 5' 9" (1.753 m)   Wt 251 lb (113.9 kg)   SpO2 96%   BMI 37.07 kg/m    Subjective:    Patient ID: Casey Cole, male    DOB: 17-Feb-1976, 44 y.o.   MRN: [id number]  HPI: Casey Cole is a 44 y.o. male  Chief Complaint  Patient presents with  . Leg Pain    right leg x 2 weeks   About 2 weeks of b/l leg pain, much worse on the right leg. Both sides of thigh, back of leg. Worse with turning foot out. Trying rest, advil, tylenol without long term relief. Notes he used to rarely take the lipitor but now taking several days a week - sxs began when he started increasing frequency of use. No known injury, swelling, discoloration, CP, SOB, palpitations. Has been trying OTC pain relievers, stretches without much relief.   Relevant past medical, surgical, family and social history reviewed and updated as indicated. Interim medical history since our last visit reviewed. Allergies and medications reviewed and updated.  Review of Systems  Per HPI unless specifically indicated above     Objective:    BP 124/84   Pulse (!) 105   Temp 98.5 F (36.9 C) (Oral)   Ht 5' 9" (1.753 m)   Wt 251 lb (113.9 kg)   SpO2 96%   BMI 37.07 kg/m   Wt Readings from Last 3 Encounters:  07/15/19 251 lb (113.9 kg)  05/10/19 240 lb (108.9 kg)  02/18/19 230 lb (104.3 kg)    Physical Exam Vitals and nursing note reviewed.  Constitutional:      Appearance: Normal appearance.  HENT:     Head: Atraumatic.  Eyes:     Extraocular Movements: Extraocular movements intact.     Conjunctiva/sclera: Conjunctivae normal.  Cardiovascular:     Rate and Rhythm: Normal rate and regular rhythm.  Pulmonary:     Effort: Pulmonary effort is normal.     Breath sounds: Normal breath sounds.  Musculoskeletal:        General: Normal range of motion.     Cervical back: Normal range of motion and neck supple.     Comments: Legs mildly ttp b/l diffusely, no  lower extremity edema, discoloration, point tenderness. - homans sign and squeeze test  Skin:    General: Skin is warm and dry.  Neurological:     General: No focal deficit present.     Mental Status: He is oriented to person, place, and time.  Psychiatric:        Mood and Affect: Mood normal.        Thought Content: Thought content normal.        Judgment: Judgment normal.     Results for orders placed or performed in visit on 05/10/19  Bayer DCA Hb A1c Waived  Result Value Ref Range   HB A1C (BAYER DCA - WAIVED) 6.3 <7.0 %  Comp Met (CMET)  Result Value Ref Range   Glucose 124 (H) 65 - 99 mg/dL   BUN 11 6 - 24 mg/dL   Creatinine, Ser 0.99 0.76 - 1.27 mg/dL   GFR calc non Af Amer 94 >59 mL/min/1.73   GFR calc Af Amer 108 >59 mL/min/1.73   BUN/Creatinine Ratio 11 9 - 20   Sodium 137 134 - 144 mmol/L   Potassium 4.0 3.5 - 5.2 mmol/L   Chloride  99 96 - 106 mmol/L   CO2 24 20 - 29 mmol/L   Calcium 9.7 8.7 - 10.2 mg/dL   Total Protein 7.1 6.0 - 8.5 g/dL   Albumin 4.5 4.0 - 5.0 g/dL   Globulin, Total 2.6 1.5 - 4.5 g/dL   Albumin/Globulin Ratio 1.7 1.2 - 2.2   Bilirubin Total 1.1 0.0 - 1.2 mg/dL   Alkaline Phosphatase 76 39 - 117 IU/L   AST 12 0 - 40 IU/L   ALT 17 0 - 44 IU/L  Lipid Panel w/o Chol/HDL Ratio OUT  Result Value Ref Range   Cholesterol, Total 229 (H) 100 - 199 mg/dL   Triglycerides 334 (H) 0 - 149 mg/dL   HDL 34 (L) >39 mg/dL   VLDL Cholesterol Cal 60 (H) 5 - 40 mg/dL   LDL Chol Calc (NIH) 135 (H) 0 - 99 mg/dL      Assessment & Plan:   Problem List Items Addressed This Visit    None    Visit Diagnoses    Myalgia    -  Primary   Make sure well hydrated, muscular vs statin side effects. Flexeril, stretches, statin holiday. Mychart msg update at that point       Follow up plan: Return for as scheduled.      

## 2019-07-29 ENCOUNTER — Ambulatory Visit
Admission: RE | Admit: 2019-07-29 | Discharge: 2019-07-29 | Disposition: A | Discharge status: Home / Self Care | Payer: Commercial Managed Care - PPO | Attending: Nurse Practitioner | Admitting: Nurse Practitioner

## 2019-07-29 ENCOUNTER — Ambulatory Visit (INDEPENDENT_AMBULATORY_CARE_PROVIDER_SITE_OTHER): Payer: Commercial Managed Care - PPO | Admitting: Nurse Practitioner

## 2019-07-29 ENCOUNTER — Other Ambulatory Visit: Payer: Self-pay

## 2019-07-29 ENCOUNTER — Ambulatory Visit
Admission: RE | Admit: 2019-07-29 | Discharge: 2019-07-29 | Disposition: A | Discharge status: Home / Self Care | Payer: Commercial Managed Care - PPO | Source: Ambulatory Visit | Attending: Nurse Practitioner | Admitting: Nurse Practitioner

## 2019-07-29 ENCOUNTER — Encounter: Payer: Self-pay | Admitting: Nurse Practitioner

## 2019-07-29 VITALS — BP 142/95 | HR 106 | Temp 98.1°F | Ht 69.0 in | Wt 250.0 lb

## 2019-07-29 DIAGNOSIS — R071 Chest pain on breathing: Secondary | ICD-10-CM | POA: Diagnosis present

## 2019-07-29 DIAGNOSIS — R109 Unspecified abdominal pain: Secondary | ICD-10-CM | POA: Diagnosis not present

## 2019-07-29 DIAGNOSIS — T148XXA Other injury of unspecified body region, initial encounter: Secondary | ICD-10-CM | POA: Diagnosis not present

## 2019-07-29 LAB — UA/M W/RFLX CULTURE, ROUTINE
Bilirubin, UA: NEGATIVE
Glucose, UA: NEGATIVE
Ketones, UA: NEGATIVE
Leukocytes,UA: NEGATIVE
Nitrite, UA: NEGATIVE
Protein,UA: NEGATIVE
RBC, UA: NEGATIVE
Specific Gravity, UA: 1.02 (ref 1.005–1.030)
Urobilinogen, Ur: 1 mg/dL (ref 0.2–1.0)
pH, UA: 7 (ref 5.0–7.5)

## 2019-07-29 LAB — MICROSCOPIC EXAMINATION
Bacteria, UA: NONE SEEN
RBC, Urine: NONE SEEN /hpf (ref 0–2)

## 2019-07-29 MED ORDER — NAPROXEN 500 MG PO TABS: 500.0000 mg | ORAL_TABLET | Freq: Two times a day (BID) | ORAL | 0 refills | Status: DC

## 2019-07-29 NOTE — Patient Instructions (Signed)
Muscle Strain  A muscle strain is an injury that occurs when a muscle is stretched beyond its normal length. Usually, a small number of muscle fibers are torn when this happens. There are three types of muscle strains. First-degree strains have the least amount of muscle fiber tearing and the least amount of pain. Second-degree and third-degree strains have more tearing and pain.  Usually, recovery from muscle strain takes 1-2 weeks. Complete healing normally takes 5-6 weeks.  What are the causes?  This condition is caused when a sudden, violent force is placed on a muscle and stretches it too far. This may occur with a fall, lifting, or sports.  What increases the risk?  This condition is more likely to develop in athletes and people who are physically active.  What are the signs or symptoms?  Symptoms of this condition include:  · Pain.  · Bruising.  · Swelling.  · Trouble using the muscle.  How is this diagnosed?  This condition is diagnosed based on a physical exam and your medical history. Tests may also be done, including an X-ray, ultrasound, or MRI.  How is this treated?  This condition is initially treated with PRICE therapy. This therapy involves:  · Protecting the muscle from being injured again.  · Resting the injured muscle.  · Icing the injured muscle.  · Applying pressure (compression) to the injured muscle. This may be done with a splint or elastic bandage.  · Raising (elevating) the injured muscle.  Your health care provider may also recommend medicine for pain.  Follow these instructions at home:  If you have a splint:  · Wear the splint as told by your health care provider. Remove it only as told by your health care provider.  · Loosen the splint if your fingers or toes tingle, become numb, or turn cold and blue.  · Keep the splint clean.  · If the splint is not waterproof:  ? Do not let it get wet.  ? Cover it with a watertight covering when you take a bath or a shower.  Managing pain, stiffness,  and swelling    · If directed, put ice on the injured area.  ? If you have a removable splint, remove it as told by your health care provider.  ? Put ice in a plastic bag.  ? Place a towel between your skin and the bag.  ? Leave the ice on for 20 minutes, 2-3 times a day.  · Move your fingers or toes often to avoid stiffness and to lessen swelling.  · Raise (elevate) the injured area above the level of your heart while you are sitting or lying down.  · Wear an elastic bandage as told by your health care provider. Make sure that it is not too tight.  General instructions  · Take over-the-counter and prescription medicines only as told by your health care provider.  · Restrict your activity and rest the injured muscle as told by your health care provider. Gentle movements may be allowed.  · If physical therapy was prescribed, do exercises as told by your health care provider.  · Do not put pressure on any part of the splint until it is fully hardened. This may take several hours.  · Do not use any products that contain nicotine or tobacco, such as cigarettes and e-cigarettes. These can delay bone healing. If you need help quitting, ask your health care provider.  · Ask your health care provider when it   You have more pain or swelling in the injured area. Get help right away if:  You have numbness or tingling or lose a lot of strength in the injured area. Summary  A muscle strain is an injury that occurs when a muscle is stretched beyond its normal length.  This condition is caused when a sudden, violent force is placed on a muscle and stretches it too far.  This condition is initially treated with PRICE therapy, which involves protecting, resting,  icing, compressing, and elevating.  Gentle movements may be allowed. If physical therapy was prescribed, do exercises as told by your health care provider. This information is not intended to replace advice given to you by your health care provider. Make sure you discuss any questions you have with your health care provider. Document Revised: 04/04/2017 Document Reviewed: 05/29/2016 [employer] Patient Education  2020 [employer]  

## 2019-07-29 NOTE — Progress Notes (Signed)
BP (!) 142/95 (BP Location: Left Arm, Patient Position: Sitting, Cuff Size: Normal)   Pulse (!) 106   Temp 98.1 F (36.7 C) (Oral)   Ht 5\' 9"  (1.753 m)   Wt 250 lb (113.4 kg)   SpO2 95%   BMI 36.92 kg/m    Subjective:    Patient ID: Casey Cole, male    DOB: 05-10-1975, 44 y.o.   MRN: [id number]  HPI: Casey Cole is a 44 y.o. male presenting for right-sided back pain.  Chief Complaint  Patient presents with  . Flank Pain   BACK PAIN Patient reports he woke up with right-sided back pain yesterday morning, took Advil/ibuprofen and it seemed to help somewhat.  He reports the pain continued into the night last night and he did not sleep well.  He has also recently suffered from right thigh pain, the pain in his thigh has been ongoing and stopping the statin has not helped much yet, according to the patient. Duration: days Mechanism of injury: no trauma Location: Right Onset: gradual Severity: 7/10 Quality: aching Frequency: constant Radiation: none Aggravating factors: taking a deep breath, laying down Alleviating factors: NSAIDs, heat Status: stable Treatments attempted: heat and ibuprofen  Relief with NSAIDs?: mild Nighttime pain:  no Paresthesias / decreased sensation:  no Bowel / bladder incontinence:  no Fevers:  no Dysuria / urinary frequency:  no  Nausea/vomiting: no Cough: no Shortness of breath: no Fever: no  No Known Allergies  Outpatient Encounter Medications as of 07/29/2019  Medication Sig  . cyclobenzaprine (FLEXERIL) 10 MG tablet Take 1 tablet (10 mg total) by mouth 3 (three) times daily as needed for muscle spasms.  . Guselkumab (TREMFYA [place]wolf) Inject into the skin.  . meloxicam (MOBIC) 15 MG tablet meloxicam 15 mg tablet  TAKE 1 TABLET BY MOUTH EVERY DAY  . metformin (FORTAMET) 1000 MG (OSM) 24 hr tablet Take 1 tablet (1,000 mg total) by mouth daily with breakfast.  . ONE TOUCH ULTRA TEST test strip USE AS DIRECTED ONCE DAILY  . Semaglutide, 1  MG/DOSE, (OZEMPIC, 1 MG/DOSE,) 2 MG/1.5ML SOPN Inject 1 mg into the skin once a week.  Marland Kitchen atorvastatin (LIPITOR) 20 MG tablet Take 1 tablet 1-2x a week (Patient not taking: Reported on 07/29/2019)   No facility-administered encounter medications on file as of 07/29/2019.   Patient Active Problem List   Diagnosis Date Noted  . Flank pain 07/29/2019  . Muscle strain 07/29/2019  . Recurrent major depressive disorder, in full remission ([place]) 10/26/2018  . OSA (obstructive sleep apnea) 10/27/2015  . Hyperlipidemia   . Benign hypertensive renal disease   . Diabetes mellitus without complication [facility])    Past Medical History:  Diagnosis Date  . Abscess of superficial perineal space 08/23/2015  . Depression 10/27/2015  . Diabetes mellitus without complication ([place])   . Hyperlipidemia   . Hypertension   . Sleep apnea    Relevant past medical, surgical, family and social history reviewed and updated as indicated. Interim medical history since our last visit reviewed. Allergies and medications reviewed and updated.  Review of Systems  Constitutional: Negative.  Negative for activity change, appetite change, fatigue and fever.  HENT: Negative.  Negative for postnasal drip, rhinorrhea, sinus pressure, sneezing and sore throat.   Respiratory: Negative.  Negative for cough, shortness of breath and wheezing.   Gastrointestinal: Negative.  Negative for diarrhea, nausea and vomiting.  Genitourinary: Positive for flank pain. Negative for dysuria, frequency and urgency.  Musculoskeletal: Positive for back  pain and myalgias. Negative for arthralgias, gait problem, joint swelling and neck pain.  Skin: Negative.  Negative for color change and rash.  Neurological: Negative.  Negative for dizziness, tremors, weakness and numbness.  Psychiatric/Behavioral: Positive for sleep disturbance. Negative for confusion. The patient is not nervous/anxious.    Per HPI unless specifically indicated above       Objective:    BP (!) 142/95 (BP Location: Left Arm, Patient Position: Sitting, Cuff Size: Normal)   Pulse (!) 106   Temp 98.1 F (36.7 C) (Oral)   Ht 5\' 9"  (1.753 m)   Wt 250 lb (113.4 kg)   SpO2 95%   BMI 36.92 kg/m   Wt Readings from Last 3 Encounters:  07/29/19 250 lb (113.4 kg)  07/15/19 251 lb (113.9 kg)  05/10/19 240 lb (108.9 kg)    Physical Exam Vitals and nursing note reviewed.  Constitutional:      General: He is not in acute distress.    Appearance: Normal appearance. He is not toxic-appearing.  HENT:     Nose: Nose normal. No congestion.     Mouth/Throat:     Mouth: Mucous membranes are moist.     Pharynx: Oropharynx is clear.  Eyes:     General: No scleral icterus.    Extraocular Movements: Extraocular movements intact.  Cardiovascular:     Rate and Rhythm: Normal rate and regular rhythm.     Heart sounds: No murmur.  Pulmonary:     Effort: Pulmonary effort is normal. No respiratory distress.     Breath sounds: Normal breath sounds. No wheezing or rhonchi.  Abdominal:     General: Abdomen is flat. Bowel sounds are normal.     Palpations: Abdomen is soft. There is no mass.     Tenderness: There is no abdominal tenderness (right upper). There is no right CVA tenderness or left CVA tenderness.  Musculoskeletal:        General: No swelling. Normal range of motion.     Cervical back: Normal range of motion. No rigidity.     Thoracic back: Tenderness (right side, posteriorly and laterally) present.     Right lower leg: No edema.     Left lower leg: No edema.  Skin:    General: Skin is warm and dry.     Coloration: Skin is not jaundiced.     Findings: No erythema.  Neurological:     General: No focal deficit present.     Mental Status: He is alert and oriented to person, place, and time.     Motor: No weakness.     Gait: Gait normal.  Psychiatric:        Mood and Affect: Mood normal.        Behavior: Behavior normal.        Thought Content: Thought  content normal.        Judgment: Judgment normal.       Assessment & Plan:   Problem List Items Addressed This Visit      Musculoskeletal and Integument   Muscle strain    Acute, ongoing.  UA negative in office, microscopic did show some casts.  ?kidney stone vs. Musculoskeletal strain due to ongoing pain from right thigh.  Educated on potential kidney stone provided; if severe nausea/vomiting and unable to keep fluids down and/or severe pain, go to ED.  For muscle strain, stop ibuprofen/Aleve and treat with bid Naproxen, heat, and stretches.  May consider physical therapy in near future if  not better.        Other   Flank pain - Primary    Acute, ongoing.  UA negative in office, microscopic did show some casts.  ?kidney stone vs. Musculoskeletal strain due to ongoing pain from right thigh.  Educated on potential kidney stone provided; if severe nausea/vomiting and unable to keep fluids down and/or severe pain, go to ED.  For muscle strain, stop ibuprofen/Aleve and treat with bid Naproxen, heat, and stretches.  May consider physical therapy in near future if not better.      Relevant Orders   UA/M w/rflx Culture, Routine    Other Visit Diagnoses    Inspiratory pain       Chest x-ray ordered to rule out pneumonia.   Relevant Orders   DG Chest 2 View (Completed)       Follow up plan: Return if symptoms worsen or fail to improve.

## 2019-07-29 NOTE — Assessment & Plan Note (Signed)
Acute, ongoing.  UA negative in office, microscopic did show some casts.  ?kidney stone vs. Musculoskeletal strain due to ongoing pain from right thigh.  Educated on potential kidney stone provided; if severe nausea/vomiting and unable to keep fluids down and/or severe pain, go to ED.  For muscle strain, stop ibuprofen/Aleve and treat with bid Naproxen, heat, and stretches.  May consider physical therapy in near future if not better.

## 2019-07-29 NOTE — Assessment & Plan Note (Addendum)
Acute, ongoing.  UA negative in office, microscopic did show some casts.  ?kidney stone vs. Musculoskeletal strain due to ongoing pain from right thigh.  Educated on potential kidney stone provided; if severe nausea/vomiting and unable to keep fluids down and/or severe pain, go to ED.  For muscle strain, stop ibuprofen/Aleve and treat with bid Naproxen, heat, and stretches.  May consider physical therapy in near future if not better.

## 2019-08-10 ENCOUNTER — Ambulatory Visit: Payer: Commercial Managed Care - PPO | Admitting: Family Medicine

## 2019-08-10 ENCOUNTER — Encounter: Payer: Self-pay | Admitting: Family Medicine

## 2019-08-10 ENCOUNTER — Other Ambulatory Visit: Payer: Self-pay

## 2019-08-10 VITALS — BP 139/85 | HR 100 | Temp 97.9°F | Ht 69.59 in | Wt 249.0 lb

## 2019-08-10 DIAGNOSIS — S76219A Strain of adductor muscle, fascia and tendon of unspecified thigh, initial encounter: Secondary | ICD-10-CM | POA: Diagnosis not present

## 2019-08-10 DIAGNOSIS — E119 Type 2 diabetes mellitus without complications: Secondary | ICD-10-CM

## 2019-08-10 LAB — BAYER DCA HB A1C WAIVED: HB A1C (BAYER DCA - WAIVED): 6.7 % (ref <7.0)

## 2019-08-10 MED ORDER — OZEMPIC (1 MG/DOSE) 2 MG/1.5ML ~~LOC~~ SOPN: 1.0000 mg | PEN_INJECTOR | SUBCUTANEOUS | 1 refills | Status: DC

## 2019-08-10 MED ORDER — METFORMIN HCL ER (OSM) 1000 MG PO TB24: 1000.0000 mg | ORAL_TABLET | Freq: Every day | ORAL | 1 refills | Status: DC

## 2019-08-10 NOTE — Patient Instructions (Signed)
Adductor Muscle Strain With Rehab Ask your health care provider which exercises are safe for you. Do exercises exactly as told by your health care provider and adjust them as directed. It is normal to feel mild stretching, pulling, tightness, or mild discomfort as you do these exercises. Stop right away if you feel sudden pain or your pain gets worse. Do not begin these exercises until told by your health care provider. Strengthening exercises These exercises build strength and endurance in your thighs. Endurance is the ability to use your muscles for a long time, even after your muscles get tired. Hip adductor isometrics  This exercise is sometimes called inner thigh squeeze. 1. Sit on a firm chair that positions your knees at about the same height as your hips. 2. Place a large ball, firm pillow, or rolled-up bath towel between your thighs. 3. Squeeze your thighs together, gradually building tension. 4. Hold for __________ seconds. 5. Release the tension gradually. Allow your inner thigh muscles to relax completely before you start the next repetition. Repeat __________ times. Complete this exercise __________ times a day. Hip adduction  This exercise is sometimes called side lying straight leg raises. 1. Lie on your side so your head, shoulder, knee, and hip are in a straight line with each other. To help maintain your balance, you may put the foot of your top leg in front of the leg that is on the floor. Your left / right leg should be on the bottom. 2. Roll your hips slightly forward so your hips are stacked directly over each other and your left / right knee is facing forward. 3. Tense the muscles of your inner thigh and lift your bottom leg 4-6 inches (10-15 cm). 4. Hold this position for __________ seconds. 5. Slowly lower your leg to the starting position. 6. Allow your muscles to relax completely before you start the next repetition. Repeat __________ times. Complete this exercise  __________ times a day. Hip extension  This exercise is sometimes called prone (on your belly) straight leg raises. 1. Lie on your belly on a bed or a firm surface with a pillow under your hips. 2. Tense your buttock muscles and lift your left / right thigh off the bed. Your left / right knee can be bent or straight, but do not let your back arch. 3. Hold this position for __________ seconds. 4. Slowly return to the starting position. 5. Allow your muscles to relax completely before you start the next repetition. Repeat __________ times. Complete this exercise __________ times a day. Balance exercises These exercises improve or maintain your balance. Balance is important in preventing falls. Single-leg balance 1. Stand near a railing or by a door frame that you can hold onto as needed. 2. Stand on your left / right foot. Keep your big toe down on the floor and try to keep your arch lifted. 3. If this is too easy, you can stand with your eyes closed or stand on a pillow. 4. Hold this position for __________ seconds. Repeat __________ times. Complete this exercise __________ times a day. Side lunges 1. Stand with your feet together. 2. Keeping one foot in place, step to the side with your other foot about __________ inches (__________ cm). Do not step so far that you feel discomfort in your middle thigh. 3. Push off from your stepping foot to return to the starting position. Repeat __________ times. Complete this exercise __________ times a day. This information is not intended to replace  advice given to you by your health care provider. Make sure you discuss any questions you have with your health care provider. Document Revised: 08/11/2018 Document Reviewed: 01/20/2018 Elsevier Patient Education  [place].

## 2019-08-10 NOTE — Assessment & Plan Note (Signed)
Doing well with A1c of 6.7- continue current regimen. Recheck 6 months. Call with any concerns.

## 2019-08-10 NOTE — Progress Notes (Signed)
BP 139/85 (BP Location: Left Arm, Patient Position: Sitting, Cuff Size: Normal)   Pulse 100   Temp 97.9 F (36.6 C) (Oral)   Ht 5' 9.59" (1.768 m)   Wt 249 lb (112.9 kg)   SpO2 96%   BMI 36.15 kg/m    Subjective:    Patient ID: Casey Cole, male    DOB: [birth year], 44 y.o.   MRN: [id number]  HPI: Casey Cole is a 44 y.o. male  Chief Complaint  Patient presents with  . Diabetes  . Leg Pain   DIABETES Hypoglycemic episodes:no Polydipsia/polyuria: no Visual disturbance: no Chest pain: no Paresthesias: no Glucose Monitoring: no  Accucheck frequency: Not Checking Taking Insulin?: no Blood Pressure Monitoring: not checking Retinal Examination: Not up to Date Foot Exam: Up to Date Diabetic Education: Completed Pneumovax: Up to Date Influenza: Up to Date Aspirin: no  Has been having leg pain with twisting. Shooting pain that runs up towards the groin. Has been going on about a month. Doesn't remember doing anything. No numbness or tingling. Really hurts to put his sock on. No other concerns or complaints at this time.   Relevant past medical, surgical, family and social history reviewed and updated as indicated. Interim medical history since our last visit reviewed. Allergies and medications reviewed and updated.  Review of Systems  Constitutional: Negative.   Respiratory: Negative.   Cardiovascular: Negative.   Gastrointestinal: Negative.   Musculoskeletal: Positive for arthralgias and myalgias. Negative for back pain, gait problem, joint swelling, neck pain and neck stiffness.  Skin: Negative.   Neurological: Negative.   Psychiatric/Behavioral: Negative.     Per HPI unless specifically indicated above     Objective:    BP 139/85 (BP Location: Left Arm, Patient Position: Sitting, Cuff Size: Normal)   Pulse 100   Temp 97.9 F (36.6 C) (Oral)   Ht 5' 9.59" (1.768 m)   Wt 249 lb (112.9 kg)   SpO2 96%   BMI 36.15 kg/m   Wt Readings from Last 3 Encounters:   08/10/19 249 lb (112.9 kg)  07/29/19 250 lb (113.4 kg)  07/15/19 251 lb (113.9 kg)    Physical Exam Vitals and nursing note reviewed.  Constitutional:      General: He is not in acute distress.    Appearance: Normal appearance. He is not ill-appearing, toxic-appearing or diaphoretic.  HENT:     Head: Normocephalic and atraumatic.     Right Ear: External ear normal.     Left Ear: External ear normal.     Nose: Nose normal.     Mouth/Throat:     Mouth: Mucous membranes are moist.     Pharynx: Oropharynx is clear.  Eyes:     General: No scleral icterus.       Right eye: No discharge.        Left eye: No discharge.     Extraocular Movements: Extraocular movements intact.     Conjunctiva/sclera: Conjunctivae normal.     Pupils: Pupils are equal, round, and reactive to light.  Cardiovascular:     Rate and Rhythm: Normal rate and regular rhythm.     Pulses: Normal pulses.     Heart sounds: Normal heart sounds. No murmur. No friction rub. No gallop.   Pulmonary:     Effort: Pulmonary effort is normal. No respiratory distress.     Breath sounds: Normal breath sounds. No stridor. No wheezing, rhonchi or rales.  Chest:     Chest wall: No tenderness.  Musculoskeletal:        General: Tenderness (swelling and tenderness on R adductor) present. Normal range of motion.     Cervical back: Normal range of motion and neck supple.  Skin:    General: Skin is warm and dry.     Capillary Refill: Capillary refill takes less than 2 seconds.     Coloration: Skin is not jaundiced or pale.     Findings: No bruising, erythema, lesion or rash.  Neurological:     General: No focal deficit present.     Mental Status: He is alert and oriented to person, place, and time. Mental status is at baseline.  Psychiatric:        Mood and Affect: Mood normal.        Behavior: Behavior normal.        Thought Content: Thought content normal.        Judgment: Judgment normal.     Results for orders placed  or performed in visit on 07/29/19  Microscopic Examination   URINE  Result Value Ref Range   WBC, UA 0-5 0 - 5 /hpf   RBC None seen 0 - 2 /hpf   Epithelial Cells (non renal) 0-10 0 - 10 /hpf   Casts Present None seen /lpf   Cast Type Granular casts (A) N/A   Crystals Present N/A   Crystal Type Amorphous Sediment N/A   Bacteria, UA None seen None seen/Few  UA/M w/rflx Culture, Routine   Specimen: Urine   URINE  Result Value Ref Range   Specific Gravity, UA 1.020 1.005 - 1.030   pH, UA 7.0 5.0 - 7.5   Color, UA Yellow Yellow   Appearance Ur Hazy (A) Clear   Leukocytes,UA Negative Negative   Protein,UA Negative Negative/Trace   Glucose, UA Negative Negative   Ketones, UA Negative Negative   RBC, UA Negative Negative   Bilirubin, UA Negative Negative   Urobilinogen, Ur 1.0 0.2 - 1.0 mg/dL   Nitrite, UA Negative Negative   Microscopic Examination See below:       Assessment & Plan:   Problem List Items Addressed This Visit      Endocrine   Diabetes mellitus without complication ([place]) - Primary    Doing well with A1c of 6.7- continue current regimen. Recheck 6 months. Call with any concerns.       Relevant Medications   Semaglutide, 1 MG/DOSE, (OZEMPIC, 1 MG/DOSE,) 2 MG/1.5ML SOPN   metformin (FORTAMET) 1000 MG (OSM) 24 hr tablet   Other Relevant Orders   Bayer DCA Hb A1c Waived    Other Visit Diagnoses    Strain of adductor muscle of thigh       Will treat with flexeril and stretches. If not better by Friday, will refer to PT.        Follow up plan: Return in about 6 months (around 02/09/2020).

## 2019-08-17 ENCOUNTER — Telehealth: Payer: Self-pay | Admitting: Family Medicine

## 2019-08-17 NOTE — Telephone Encounter (Signed)
Copied from [place] [id number]. Topic: General - Other >> Aug 17, 2019  4:02 PM Yvette Rack wrote: Reason for CRM: Shu with [facility] stated the Rx for Semaglutide, 1 MG/DOSE, (OZEMPIC, 1 MG/DOSE,) 2 MG/1.5ML SOPN has been discontinued and the pen now comes in 4 MG. Shu requests new Rx for 4 MG be sent

## 2019-08-17 NOTE — Telephone Encounter (Signed)
Routing to provider to advise.  

## 2019-08-17 NOTE — Telephone Encounter (Signed)
Our system has not been updated to sent that electronically. Please call in 1mg  weekly on 4mg  pens #3 pens with 1 refill. Thanks!

## 2019-08-17 NOTE — Telephone Encounter (Signed)
Called pharmacy. They state that the 1 mg pens have been discontinued and now come in 4 mg pens. Pharmacy states that the patient will still be able to take the 1 mg dose weekly.

## 2019-08-17 NOTE — Telephone Encounter (Signed)
I dont know what this means- can we get some clarification? Is this a 4mg  pen? I'm not going to increase him from 1mg  to 4mg  weekly- that's not recommended

## 2019-08-18 NOTE — Telephone Encounter (Signed)
Medication called into the pharmacy.

## 2019-09-19 NOTE — Progress Notes (Signed)
BP 132/87 (BP Location: Left Arm, Patient Position: Sitting, Cuff Size: Normal)   Pulse 96   Temp 98 F (36.7 C) (Oral)   Ht 5' 9.49" (1.765 m)   Wt 242 lb (109.8 kg)   SpO2 97%   BMI 35.24 kg/m    Subjective:    Patient ID: Casey Cole, male    DOB: 08/03/1975, 44 y.o.   MRN: [id number]  HPI: Casey Cole is a 44 y.o. male presenting on 09/20/2019 for comprehensive medical examination. Current medical complaints include:  HYPERTENSION / HYPERLIPIDEMIA Satisfied with current treatment? yes Duration of hypertension: chronic BP monitoring frequency: not checking BP medication side effects: not on anything Duration of hyperlipidemia: chronic Cholesterol medication side effects: no Cholesterol supplements: none Past cholesterol medications: atorvastatin Medication compliance: excellent compliance Aspirin: no Recent stressors: no Recurrent headaches: no Visual changes: no Palpitations: no Dyspnea: no Chest pain: no Lower extremity edema: no Dizzy/lightheaded: no  DIABETES Hypoglycemic episodes:no Polydipsia/polyuria: no Visual disturbance: no Chest pain: no Paresthesias: no Glucose Monitoring: no  Accucheck frequency: Not Checking Taking Insulin?: no Blood Pressure Monitoring: not checking Retinal Examination: Not up to Date Foot Exam: Up to Date Diabetic Education: Completed Pneumovax: Not up to Date Influenza: Not up to Date Aspirin: no  DEPRESSION Mood status: controlled Satisfied with current treatment?: yes Symptom severity: mild  Duration of current treatment : not on anything Depressed mood: no Anxious mood: no Anhedonia: no Significant weight loss or gain: no Insomnia: no  Fatigue: no Feelings of worthlessness or guilt: no Impaired concentration/indecisiveness: no Suicidal ideations: no Hopelessness: no Crying spells: no Depression screen [facility] 2/9 09/20/2019 05/10/2019 10/26/2018 01/15/2018 10/30/2017  Decreased Interest 0 1 1 1 1   Down,  Depressed, Hopeless 0 0 1 1 0  PHQ - 2 Score 0 1 2 2 1   Altered sleeping 0 1 0 2 2  Tired, decreased energy 0 1 1 2 2   Change in appetite 0 1 1 2  0  Feeling bad or failure about yourself  0 0 0 2 1  Trouble concentrating 0 1 1 2 2   Moving slowly or fidgety/restless 0 0 0 1 0  Suicidal thoughts 0 0 0 0 0  PHQ-9 Score 0 5 5 13 8   Difficult doing work/chores Not difficult at all Not difficult at all Somewhat difficult - Somewhat difficult  Some recent data might be hidden   Interim Problems from his last visit: no  Depression Screen done today and results listed below:  Depression screen [facility] 2/9 09/20/2019 05/10/2019 10/26/2018 01/15/2018 10/30/2017  Decreased Interest 0 1 1 1 1   Down, Depressed, Hopeless 0 0 1 1 0  PHQ - 2 Score 0 1 2 2 1   Altered sleeping 0 1 0 2 2  Tired, decreased energy 0 1 1 2 2   Change in appetite 0 1 1 2  0  Feeling bad or failure about yourself  0 0 0 2 1  Trouble concentrating 0 1 1 2 2   Moving slowly or fidgety/restless 0 0 0 1 0  Suicidal thoughts 0 0 0 0 0  PHQ-9 Score 0 5 5 13 8   Difficult doing work/chores Not difficult at all Not difficult at all Somewhat difficult - Somewhat difficult  Some recent data might be hidden    Past Medical History:  Past Medical History:  Diagnosis Date  . Abscess of superficial perineal space 08/23/2015  . Depression 10/27/2015  . Diabetes mellitus without complication ([place])   . Hyperlipidemia   . Hypertension   .  Recurrent major depressive disorder, in full remission ([place]) 10/26/2018  . Sleep apnea     Surgical History:  Past Surgical History:  Procedure Laterality Date  . CHOLECYSTECTOMY    . INCISE AND DRAIN ABCESS Left 07-12-15   left buttock  . INCISION AND DRAINAGE PERIRECTAL ABSCESS N/A 08/23/2015   Procedure: IRRIGATION AND DEBRIDEMENT PERIRECTAL ABSCESS;  Surgeon: Robert Bellow, MD;  Location: [facility] ORS;  Service: General;  Laterality: N/A;  Anal Scope  . VASECTOMY      Medications:  Current Outpatient  Medications on File Prior to Visit  Medication Sig  . Guselkumab (TREMFYA [place]) Inject into the skin.  Marland Kitchen metformin (FORTAMET) 1000 MG (OSM) 24 hr tablet Take 1 tablet (1,000 mg total) by mouth daily with breakfast.  . ONE TOUCH ULTRA TEST test strip USE AS DIRECTED ONCE DAILY  . Semaglutide, 1 MG/DOSE, (OZEMPIC, 1 MG/DOSE,) 2 MG/1.5ML SOPN Inject 1 mg into the skin once a week.  Marland Kitchen atorvastatin (LIPITOR) 20 MG tablet Take 1 tablet 1-2x a week (Patient not taking: Reported on 08/10/2019)  . cyclobenzaprine (FLEXERIL) 10 MG tablet Take 1 tablet (10 mg total) by mouth 3 (three) times daily as needed for muscle spasms. (Patient not taking: Reported on 09/20/2019)  . meloxicam (MOBIC) 15 MG tablet meloxicam 15 mg tablet  TAKE 1 TABLET BY MOUTH EVERY DAY  . naproxen (NAPROSYN) 500 MG tablet Take 1 tablet (500 mg total) by mouth 2 (two) times daily with a meal. (Patient not taking: Reported on 09/20/2019)   No current facility-administered medications on file prior to visit.    Allergies:  No Known Allergies  Social History:  Social History   Socioeconomic History  . Marital status: Married    Spouse name: Not on file  . Number of children: Not on file  . Years of education: Not on file  . Highest education level: Not on file  Occupational History  . Not on file  Tobacco Use  . Smoking status: Current Every Day Smoker    Packs/day: 1.00    Types: Cigarettes  . Smokeless tobacco: Never Used  Substance and Sexual Activity  . Alcohol use: Not Currently  . Drug use: No  . Sexual activity: Yes  Other Topics Concern  . Not on file  Social History Narrative  . Not on file   Social Determinants of Health   Financial Resource Strain:   . Difficulty of Paying Living Expenses:   Food Insecurity:   . Worried About Charity fundraiser in the Last Year:   . Arboriculturist in the Last Year:   Transportation Needs:   . Film/video editor (Medical):   Marland Kitchen Lack of Transportation (Non-Medical):    Physical Activity:   . Days of Exercise per Week:   . Minutes of Exercise per Session:   Stress:   . Feeling of Stress :   Social Connections:   . Frequency of Communication with Friends and Family:   . Frequency of Social Gatherings with Friends and Family:   . Attends Religious Services:   . Active Member of Clubs or Organizations:   . Attends Archivist Meetings:   Marland Kitchen Marital Status:   Intimate Partner Violence:   . Fear of Current or Ex-Partner:   . Emotionally Abused:   Marland Kitchen Physically Abused:   . Sexually Abused:    Social History   Tobacco Use  Smoking Status Current Every Day Smoker  . Packs/day: 1.00  .  Types: Cigarettes  Smokeless Tobacco Never Used   Social History   Substance and Sexual Activity  Alcohol Use Not Currently    Family History:  Family History  Problem Relation Age of Onset  . Hypertension Father   . Stroke Paternal Grandfather   . Heart attack Paternal Grandfather     Past medical history, surgical history, medications, allergies, family history and social history reviewed with patient today and changes made to appropriate areas of the chart.   Review of Systems  Constitutional: Negative.   HENT: Negative.   Eyes: Negative.   Respiratory: Negative.   Cardiovascular: Negative.   Gastrointestinal: Positive for heartburn (with food choices, resolves with OTC). Negative for abdominal pain, blood in stool, constipation, diarrhea, melena, nausea and vomiting.  Genitourinary: Negative.   Musculoskeletal: Positive for myalgias. Negative for back pain, falls, joint pain and neck pain.  Skin: Negative.   Neurological: Negative.   Endo/Heme/Allergies: Positive for environmental allergies. Negative for polydipsia. Does not bruise/bleed easily.  Psychiatric/Behavioral: Negative.     All other ROS negative except what is listed above and in the HPI.      Objective:    BP 132/87 (BP Location: Left Arm, Patient Position: Sitting, Cuff  Size: Normal)   Pulse 96   Temp 98 F (36.7 C) (Oral)   Ht 5' 9.49" (1.765 m)   Wt 242 lb (109.8 kg)   SpO2 97%   BMI 35.24 kg/m   Wt Readings from Last 3 Encounters:  09/20/19 242 lb (109.8 kg)  08/10/19 249 lb (112.9 kg)  07/29/19 250 lb (113.4 kg)    Physical Exam Vitals and nursing note reviewed.  Constitutional:      General: He is not in acute distress.    Appearance: Normal appearance. He is obese. He is not ill-appearing, toxic-appearing or diaphoretic.  HENT:     Head: Normocephalic and atraumatic.     Right Ear: Tympanic membrane, ear canal and external ear normal. There is no impacted cerumen.     Left Ear: Tympanic membrane, ear canal and external ear normal. There is no impacted cerumen.     Nose: Nose normal. No congestion or rhinorrhea.     Mouth/Throat:     Mouth: Mucous membranes are moist.     Pharynx: Oropharynx is clear. No oropharyngeal exudate or posterior oropharyngeal erythema.  Eyes:     General: No scleral icterus.       Right eye: No discharge.        Left eye: No discharge.     Extraocular Movements: Extraocular movements intact.     Conjunctiva/sclera: Conjunctivae normal.     Pupils: Pupils are equal, round, and reactive to light.  Neck:     Vascular: No carotid bruit.  Cardiovascular:     Rate and Rhythm: Normal rate and regular rhythm.     Pulses: Normal pulses.     Heart sounds: No murmur. No friction rub. No gallop.   Pulmonary:     Effort: Pulmonary effort is normal. No respiratory distress.     Breath sounds: Normal breath sounds. No stridor. No wheezing, rhonchi or rales.  Chest:     Chest wall: No tenderness.  Abdominal:     General: Abdomen is flat. Bowel sounds are normal. There is no distension.     Palpations: Abdomen is soft. There is no mass.     Tenderness: There is no abdominal tenderness. There is no right CVA tenderness, left CVA tenderness, guarding or rebound.  Hernia: No hernia is present.  Genitourinary:     Comments: Genital exam deferred with shared decision making Musculoskeletal:        General: No swelling, tenderness, deformity or signs of injury.     Cervical back: Normal range of motion and neck supple. No rigidity. No muscular tenderness.     Right lower leg: No edema.     Left lower leg: No edema.  Lymphadenopathy:     Cervical: No cervical adenopathy.  Skin:    General: Skin is warm and dry.     Capillary Refill: Capillary refill takes less than 2 seconds.     Coloration: Skin is not jaundiced or pale.     Findings: No bruising, erythema, lesion or rash.  Neurological:     General: No focal deficit present.     Mental Status: He is alert and oriented to person, place, and time.     Cranial Nerves: No cranial nerve deficit.     Sensory: No sensory deficit.     Motor: No weakness.     Coordination: Coordination normal.     Gait: Gait normal.     Deep Tendon Reflexes: Reflexes normal.  Psychiatric:        Mood and Affect: Mood normal.        Behavior: Behavior normal.        Thought Content: Thought content normal.        Judgment: Judgment normal.     Results for orders placed or performed in visit on 08/10/19  Bayer DCA Hb A1c Waived  Result Value Ref Range   HB A1C (BAYER DCA - WAIVED) 6.7 <7.0 %      Assessment & Plan:   Problem List Items Addressed This Visit      Endocrine   Diabetes mellitus without complication ([place])    Doing great with a1c of 6.6. Will continue current regimen. Continue to monitor. Call with any concerns.       Relevant Orders   Bayer DCA Hb A1c Waived   Comprehensive metabolic panel   Microalbumin, Urine Waived   Urinalysis, Routine w reflex microscopic     Genitourinary   Benign hypertensive renal disease    Doing well off medicine. Continue diet and exercise. Continue to monitor. Call with any concerns.       Relevant Orders   Comprehensive metabolic panel   Microalbumin, Urine Waived   TSH     Other   Hyperlipidemia     Under good control on current regimen. Continue current regimen. Continue to monitor. Call with any concerns. Refills given. Labs drawn today.       Relevant Orders   Comprehensive metabolic panel   Lipid Panel w/o Chol/HDL Ratio   RESOLVED: Recurrent major depressive disorder, in full remission ([place])    Doing well off medicine. Has been under good control. Will resolve off his list and add to history.      Relevant Orders   Comprehensive metabolic panel    Other Visit Diagnoses    Routine general medical examination at a health care facility    -  Primary   Vaccines up to date. Screening labs checked today. Continue diet and exercise. Call with any concerns. Continue to monitor.       LABORATORY TESTING:  Health maintenance labs ordered today as discussed above.   IMMUNIZATIONS:   - Tdap: Tetanus vaccination status reviewed: last tetanus booster within 10 years. - Influenza: Up to date - Pneumovax: Up  to date - Prevnar: Not applicable  PATIENT COUNSELING:    Sexuality: Discussed sexually transmitted diseases, partner selection, use of condoms, avoidance of unintended pregnancy  and contraceptive alternatives.   Advised to avoid cigarette smoking.  I discussed with the patient that most people either abstain from alcohol or drink within safe limits (<=14/week and <=4 drinks/occasion for males, <=7/weeks and <= 3 drinks/occasion for females) and that the risk for alcohol disorders and other health effects rises proportionally with the number of drinks per week and how often a drinker exceeds daily limits.  Discussed cessation/primary prevention of drug use and availability of treatment for abuse.   Diet: Encouraged to adjust caloric intake to maintain  or achieve ideal body weight, to reduce intake of dietary saturated fat and total fat, to limit sodium intake by avoiding high sodium foods and not adding table salt, and to maintain adequate dietary potassium and calcium  preferably from fresh fruits, vegetables, and low-fat dairy products.    stressed the importance of regular exercise  Injury prevention: Discussed safety belts, safety helmets, smoke detector, smoking near bedding or upholstery.   Dental health: Discussed importance of regular tooth brushing, flossing, and dental visits.   Follow up plan: NEXT PREVENTATIVE PHYSICAL DUE IN 1 YEAR. Return in about 6 months (around 03/22/2020).

## 2019-09-20 ENCOUNTER — Other Ambulatory Visit: Payer: Self-pay

## 2019-09-20 ENCOUNTER — Ambulatory Visit (INDEPENDENT_AMBULATORY_CARE_PROVIDER_SITE_OTHER): Payer: Commercial Managed Care - PPO | Admitting: Family Medicine

## 2019-09-20 ENCOUNTER — Encounter: Payer: Self-pay | Admitting: Family Medicine

## 2019-09-20 VITALS — BP 132/87 | HR 96 | Temp 98.0°F | Ht 69.49 in | Wt 242.0 lb

## 2019-09-20 DIAGNOSIS — E782 Mixed hyperlipidemia: Secondary | ICD-10-CM | POA: Diagnosis not present

## 2019-09-20 DIAGNOSIS — I129 Hypertensive chronic kidney disease with stage 1 through stage 4 chronic kidney disease, or unspecified chronic kidney disease: Secondary | ICD-10-CM

## 2019-09-20 DIAGNOSIS — F3342 Major depressive disorder, recurrent, in full remission: Secondary | ICD-10-CM | POA: Diagnosis not present

## 2019-09-20 DIAGNOSIS — E119 Type 2 diabetes mellitus without complications: Secondary | ICD-10-CM | POA: Diagnosis not present

## 2019-09-20 DIAGNOSIS — Z0000 Encounter for general adult medical examination without abnormal findings: Secondary | ICD-10-CM

## 2019-09-20 LAB — URINALYSIS, ROUTINE W REFLEX MICROSCOPIC
Bilirubin, UA: NEGATIVE
Glucose, UA: NEGATIVE
Ketones, UA: NEGATIVE
Leukocytes,UA: NEGATIVE
Nitrite, UA: NEGATIVE
Protein,UA: NEGATIVE
RBC, UA: NEGATIVE
Specific Gravity, UA: 1.02 (ref 1.005–1.030)
Urobilinogen, Ur: 1 mg/dL (ref 0.2–1.0)
pH, UA: 6 (ref 5.0–7.5)

## 2019-09-20 LAB — MICROALBUMIN, URINE WAIVED
Creatinine, Urine Waived: 200 mg/dL (ref 10–300)
Microalb, Ur Waived: 30 mg/L — ABNORMAL HIGH (ref 0–19)
Microalb/Creat Ratio: <30 mg/g (ref <30)

## 2019-09-20 LAB — BAYER DCA HB A1C WAIVED: HB A1C (BAYER DCA - WAIVED): 6.6 % (ref <7.0)

## 2019-09-20 NOTE — Assessment & Plan Note (Signed)
Doing well off medicine. Continue diet and exercise. Continue to monitor. Call with any concerns.

## 2019-09-20 NOTE — Assessment & Plan Note (Signed)
Doing great with a1c of 6.6. Will continue current regimen. Continue to monitor. Call with any concerns.

## 2019-09-20 NOTE — Assessment & Plan Note (Signed)
Under good control on current regimen. Continue current regimen. Continue to monitor. Call with any concerns. Refills given. Labs drawn today.   

## 2019-09-20 NOTE — Patient Instructions (Signed)

## 2019-09-20 NOTE — Assessment & Plan Note (Signed)
Doing well off medicine. Has been under good control. Will resolve off his list and add to history.

## 2019-09-21 LAB — COMPREHENSIVE METABOLIC PANEL
ALT: 23 IU/L (ref 0–44)
AST: 19 IU/L (ref 0–40)
Albumin/Globulin Ratio: 1.7 (ref 1.2–2.2)
Albumin: 4.5 g/dL (ref 4.0–5.0)
Alkaline Phosphatase: 76 IU/L (ref 48–121)
BUN/Creatinine Ratio: 9 (ref 9–20)
BUN: 9 mg/dL (ref 6–24)
Bilirubin Total: 1 mg/dL (ref 0.0–1.2)
CO2: 22 mmol/L (ref 20–29)
Calcium: 9.3 mg/dL (ref 8.7–10.2)
Chloride: 101 mmol/L (ref 96–106)
Creatinine, Ser: 1.04 mg/dL (ref 0.76–1.27)
GFR calc Af Amer: 102 mL/min/{1.73_m2} (ref >59)
GFR calc non Af Amer: 88 mL/min/{1.73_m2} (ref >59)
Globulin, Total: 2.7 g/dL (ref 1.5–4.5)
Glucose: 123 mg/dL — ABNORMAL HIGH (ref 65–99)
Potassium: 4.2 mmol/L (ref 3.5–5.2)
Sodium: 136 mmol/L (ref 134–144)
Total Protein: 7.2 g/dL (ref 6.0–8.5)

## 2019-09-21 LAB — LIPID PANEL W/O CHOL/HDL RATIO
Cholesterol, Total: 213 mg/dL — ABNORMAL HIGH (ref 100–199)
HDL: 37 mg/dL — ABNORMAL LOW (ref >39)
LDL Chol Calc (NIH): 140 mg/dL — ABNORMAL HIGH (ref 0–99)
Triglycerides: 199 mg/dL — ABNORMAL HIGH (ref 0–149)
VLDL Cholesterol Cal: 36 mg/dL (ref 5–40)

## 2019-09-21 LAB — TSH: TSH: 0.777 u[IU]/mL (ref 0.450–4.500)

## 2020-02-10 ENCOUNTER — Ambulatory Visit: Payer: Commercial Managed Care - PPO | Admitting: Family Medicine

## 2020-02-25 ENCOUNTER — Ambulatory Visit: Payer: Commercial Managed Care - PPO | Admitting: Family Medicine

## 2020-03-24 ENCOUNTER — Other Ambulatory Visit: Payer: Self-pay

## 2020-03-24 ENCOUNTER — Encounter: Payer: Self-pay | Admitting: Family Medicine

## 2020-03-24 ENCOUNTER — Ambulatory Visit: Payer: BC Managed Care – PPO | Admitting: Family Medicine

## 2020-03-24 VITALS — BP 131/82 | HR 101 | Temp 98.4°F | Ht 69.41 in | Wt 252.0 lb

## 2020-03-24 DIAGNOSIS — I129 Hypertensive chronic kidney disease with stage 1 through stage 4 chronic kidney disease, or unspecified chronic kidney disease: Secondary | ICD-10-CM

## 2020-03-24 DIAGNOSIS — E1129 Type 2 diabetes mellitus with other diabetic kidney complication: Secondary | ICD-10-CM | POA: Diagnosis not present

## 2020-03-24 DIAGNOSIS — R809 Proteinuria, unspecified: Secondary | ICD-10-CM

## 2020-03-24 DIAGNOSIS — E782 Mixed hyperlipidemia: Secondary | ICD-10-CM

## 2020-03-24 LAB — BAYER DCA HB A1C WAIVED: HB A1C (BAYER DCA - WAIVED): 7.6 % — ABNORMAL HIGH (ref <7.0)

## 2020-03-24 MED ORDER — OZEMPIC (1 MG/DOSE) 2 MG/1.5ML ~~LOC~~ SOPN: 1.0000 mg | PEN_INJECTOR | SUBCUTANEOUS | 1 refills | Status: DC

## 2020-03-24 MED ORDER — METFORMIN HCL ER (OSM) 1000 MG PO TB24: 1000.0000 mg | ORAL_TABLET | Freq: Every day | ORAL | 1 refills | Status: DC

## 2020-03-24 NOTE — Progress Notes (Signed)
BP 131/82   Pulse (!) 101   Temp 98.4 F (36.9 C) (Oral)   Ht 5' 9.41" (1.763 m)   Wt 252 lb (114.3 kg)   SpO2 96%   BMI 36.78 kg/m    Subjective:    Patient ID: Casey Cole, male    DOB: 09/21/1975, 44 y.o.   MRN: [id number]  HPI: Casey Cole is a 44 y.o. male  Chief Complaint  Patient presents with  . Diabetes  . area of concern    Left lower leg just below knee has been hurting like he hit something for about a few days.   DIABETES Hypoglycemic episodes:no Polydipsia/polyuria: no Visual disturbance: no Chest pain: no Paresthesias: no Glucose Monitoring: no  Accucheck frequency: Not Checking  Fasting glucose: Taking Insulin?: no Blood Pressure Monitoring: not checking Retinal Examination: Not up to Date Foot Exam: Not up to Date Diabetic Education: Completed Pneumovax: Up to Date Influenza: Up to Date Aspirin: yes  HYPERTENSION / HYPERLIPIDEMIA Satisfied with current treatment? yes Duration of hypertension: chronic BP monitoring frequency: not checking BP medication side effects: no Duration of hyperlipidemia: chronic Cholesterol medication side effects: no Cholesterol supplements: none Past cholesterol medications: atorvastatin Medication compliance: poor compliance Aspirin: no Recent stressors: no Recurrent headaches: no Visual changes: no Palpitations: no Dyspnea: no Chest pain: no Lower extremity edema: no Dizzy/lightheaded: no  Relevant past medical, surgical, family and social history reviewed and updated as indicated. Interim medical history since our last visit reviewed. Allergies and medications reviewed and updated.  Review of Systems  Constitutional: Negative.   Respiratory: Negative.   Cardiovascular: Negative.   Gastrointestinal: Negative.   Musculoskeletal: Negative.   Neurological: Negative.   Psychiatric/Behavioral: Negative.     Per HPI unless specifically indicated above     Objective:    BP 131/82   Pulse (!) 101    Temp 98.4 F (36.9 C) (Oral)   Ht 5' 9.41" (1.763 m)   Wt 252 lb (114.3 kg)   SpO2 96%   BMI 36.78 kg/m   Wt Readings from Last 3 Encounters:  03/24/20 252 lb (114.3 kg)  09/20/19 242 lb (109.8 kg)  08/10/19 249 lb (112.9 kg)    Physical Exam Vitals and nursing note reviewed.  Constitutional:      General: He is not in acute distress.    Appearance: Normal appearance. He is not ill-appearing, toxic-appearing or diaphoretic.  HENT:     Head: Normocephalic and atraumatic.     Right Ear: External ear normal.     Left Ear: External ear normal.     Nose: Nose normal.     Mouth/Throat:     Mouth: Mucous membranes are moist.     Pharynx: Oropharynx is clear.  Eyes:     General: No scleral icterus.       Right eye: No discharge.        Left eye: No discharge.     Extraocular Movements: Extraocular movements intact.     Conjunctiva/sclera: Conjunctivae normal.     Pupils: Pupils are equal, round, and reactive to light.  Cardiovascular:     Rate and Rhythm: Normal rate and regular rhythm.     Pulses: Normal pulses.     Heart sounds: Normal heart sounds. No murmur heard.  No friction rub. No gallop.   Pulmonary:     Effort: Pulmonary effort is normal. No respiratory distress.     Breath sounds: Normal breath sounds. No stridor. No wheezing, rhonchi or rales.  Chest:     Chest wall: No tenderness.  Musculoskeletal:        General: Normal range of motion.     Cervical back: Normal range of motion and neck supple.  Skin:    General: Skin is warm and dry.     Capillary Refill: Capillary refill takes less than 2 seconds.     Coloration: Skin is not jaundiced or pale.     Findings: No bruising, erythema, lesion or rash.  Neurological:     General: No focal deficit present.     Mental Status: He is alert and oriented to person, place, and time. Mental status is at baseline.  Psychiatric:        Mood and Affect: Mood normal.        Behavior: Behavior normal.        Thought  Content: Thought content normal.        Judgment: Judgment normal.     Results for orders placed or performed in visit on 09/20/19  Bayer DCA Hb A1c Waived  Result Value Ref Range   HB A1C (BAYER DCA - WAIVED) 6.6 <7.0 %  Comprehensive metabolic panel  Result Value Ref Range   Glucose 123 (H) 65 - 99 mg/dL   BUN 9 6 - 24 mg/dL   Creatinine, Ser 1.04 0.76 - 1.27 mg/dL   GFR calc non Af Amer 88 >59 mL/min/1.73   GFR calc Af Amer 102 >59 mL/min/1.73   BUN/Creatinine Ratio 9 9 - 20   Sodium 136 134 - 144 mmol/L   Potassium 4.2 3.5 - 5.2 mmol/L   Chloride 101 96 - 106 mmol/L   CO2 22 20 - 29 mmol/L   Calcium 9.3 8.7 - 10.2 mg/dL   Total Protein 7.2 6.0 - 8.5 g/dL   Albumin 4.5 4.0 - 5.0 g/dL   Globulin, Total 2.7 1.5 - 4.5 g/dL   Albumin/Globulin Ratio 1.7 1.2 - 2.2   Bilirubin Total 1.0 0.0 - 1.2 mg/dL   Alkaline Phosphatase 76 48 - 121 IU/L   AST 19 0 - 40 IU/L   ALT 23 0 - 44 IU/L  Lipid Panel w/o Chol/HDL Ratio  Result Value Ref Range   Cholesterol, Total 213 (H) 100 - 199 mg/dL   Triglycerides 199 (H) 0 - 149 mg/dL   HDL 37 (L) >39 mg/dL   VLDL Cholesterol Cal 36 5 - 40 mg/dL   LDL Chol Calc (NIH) 140 (H) 0 - 99 mg/dL  Microalbumin, Urine Waived  Result Value Ref Range   Microalb, Ur Waived 30 (H) 0 - 19 mg/L   Creatinine, Urine Waived 200 10 - 300 mg/dL   Microalb/Creat Ratio <30 <30 mg/g  TSH  Result Value Ref Range   TSH 0.777 0.450 - 4.500 uIU/mL  Urinalysis, Routine w reflex microscopic  Result Value Ref Range   Specific Gravity, UA 1.020 1.005 - 1.030   pH, UA 6.0 5.0 - 7.5   Color, UA Yellow Yellow   Appearance Ur Clear Clear   Leukocytes,UA Negative Negative   Protein,UA Negative Negative/Trace   Glucose, UA Negative Negative   Ketones, UA Negative Negative   RBC, UA Negative Negative   Bilirubin, UA Negative Negative   Urobilinogen, Ur 1.0 0.2 - 1.0 mg/dL   Nitrite, UA Negative Negative      Assessment & Plan:   Problem List Items Addressed This  Visit      Endocrine   DM (diabetes mellitus), type 2 with renal complications (  [place]) - Primary    Going up with A1c of 7.6 from 6.6. Had been off his medicine for a while. Restart and work on diet and exercise. Recheck 3 months. Call with any concerns.       Relevant Medications   metformin (FORTAMET) 1000 MG (OSM) 24 hr tablet   Semaglutide, 1 MG/DOSE, (OZEMPIC, 1 MG/DOSE,) 2 MG/1.5ML SOPN   Other Relevant Orders   Bayer DCA Hb A1c Waived   Comprehensive metabolic panel     Genitourinary   Benign hypertensive renal disease    Under good control on current regimen. Continue current regimen. Continue to monitor. Call with any concerns. Refills given. Labs drawn today.       Relevant Orders   Comprehensive metabolic panel     Other   Hyperlipidemia    Under good control on current regimen. Continue current regimen. Continue to monitor. Call with any concerns. Refills given. Labs drawn today.       Relevant Orders   Comprehensive metabolic panel   Lipid Panel w/o Chol/HDL Ratio       Follow up plan: Return in about 3 months (around 06/24/2020).

## 2020-03-24 NOTE — Assessment & Plan Note (Signed)
Going up with A1c of 7.6 from 6.6. Had been off his medicine for a while. Restart and work on diet and exercise. Recheck 3 months. Call with any concerns.

## 2020-03-24 NOTE — Assessment & Plan Note (Signed)
Under good control on current regimen. Continue current regimen. Continue to monitor. Call with any concerns. Refills given. Labs drawn today.   

## 2020-03-25 LAB — LIPID PANEL W/O CHOL/HDL RATIO
Cholesterol, Total: 215 mg/dL — ABNORMAL HIGH (ref 100–199)
HDL: 33 mg/dL — ABNORMAL LOW (ref >39)
LDL Chol Calc (NIH): 120 mg/dL — ABNORMAL HIGH (ref 0–99)
Triglycerides: 353 mg/dL — ABNORMAL HIGH (ref 0–149)
VLDL Cholesterol Cal: 62 mg/dL — ABNORMAL HIGH (ref 5–40)

## 2020-03-25 LAB — COMPREHENSIVE METABOLIC PANEL
ALT: 22 IU/L (ref 0–44)
AST: 14 IU/L (ref 0–40)
Albumin/Globulin Ratio: 1.6 (ref 1.2–2.2)
Albumin: 4.4 g/dL (ref 4.0–5.0)
Alkaline Phosphatase: 82 IU/L (ref 44–121)
BUN/Creatinine Ratio: 10 (ref 9–20)
BUN: 10 mg/dL (ref 6–24)
Bilirubin Total: 0.7 mg/dL (ref 0.0–1.2)
CO2: 25 mmol/L (ref 20–29)
Calcium: 9.3 mg/dL (ref 8.7–10.2)
Chloride: 99 mmol/L (ref 96–106)
Creatinine, Ser: 1.01 mg/dL (ref 0.76–1.27)
GFR calc Af Amer: 105 mL/min/{1.73_m2} (ref >59)
GFR calc non Af Amer: 91 mL/min/{1.73_m2} (ref >59)
Globulin, Total: 2.7 g/dL (ref 1.5–4.5)
Glucose: 174 mg/dL — ABNORMAL HIGH (ref 65–99)
Potassium: 4 mmol/L (ref 3.5–5.2)
Sodium: 137 mmol/L (ref 134–144)
Total Protein: 7.1 g/dL (ref 6.0–8.5)

## 2020-03-27 ENCOUNTER — Telehealth: Payer: Self-pay

## 2020-03-27 NOTE — Telephone Encounter (Signed)
PA started for Metformin HCI ER 1000 mg ER through cover my meds. [id number]

## 2020-03-27 NOTE — Telephone Encounter (Signed)
PA for Ozempic has been approved from 03/27/20 through 03/26/21

## 2020-03-27 NOTE — Telephone Encounter (Signed)
PA started for Ozempic through cover my meds.  Key: [id number]

## 2020-04-05 NOTE — Telephone Encounter (Signed)
PA for Metformin HCI ER (OSM) 1000 mg er tabs has been approved

## 2020-04-14 ENCOUNTER — Encounter: Payer: Self-pay | Admitting: Family Medicine

## 2020-04-14 ENCOUNTER — Telehealth (INDEPENDENT_AMBULATORY_CARE_PROVIDER_SITE_OTHER): Payer: BC Managed Care – PPO | Admitting: Family Medicine

## 2020-04-14 VITALS — HR 103

## 2020-04-14 DIAGNOSIS — J069 Acute upper respiratory infection, unspecified: Secondary | ICD-10-CM | POA: Diagnosis not present

## 2020-04-14 MED ORDER — HYDROCOD POLST-CPM POLST ER 10-8 MG/5ML PO SUER: 5.0000 mL | Freq: Two times a day (BID) | ORAL | 0 refills | Status: DC | PRN

## 2020-04-14 MED ORDER — PREDNISONE 50 MG PO TABS: 50.0000 mg | ORAL_TABLET | Freq: Every day | ORAL | 0 refills | Status: DC

## 2020-04-14 MED ORDER — AZITHROMYCIN 250 MG PO TABS: ORAL_TABLET | ORAL | 0 refills | Status: DC

## 2020-04-14 NOTE — Progress Notes (Signed)
Pulse (!) 103    Subjective:    Patient ID: Casey Cole, male    DOB: Jul 21, 1975, 44 y.o.   MRN: [id number]  HPI: Divonte Senger is a 44 y.o. male  Chief Complaint  Patient presents with  . Sinusitis    Pt states his throat is scratchy for 2 days, coughing, sneezing. Pt states he did at home covid test it was negative   UPPER RESPIRATORY TRACT INFECTION- negative COVID test at home Duration: 2 days Worst symptom: congestion Fever: no Cough: yes Shortness of breath: yes Wheezing: no Chest pain: yes, with cough Chest tightness: yes Chest congestion: yes Nasal congestion: yes Runny nose: no Post nasal drip: yes Sneezing: yes Sore throat: yes Swollen glands: no Sinus pressure: yes Headache: no Face pain: no Toothache: no Ear pain: no  Ear pressure: yes bilateral Eyes red/itching:no Eye drainage/crusting: no  Vomiting: no Rash: no Fatigue: yes Sick contacts: no Strep contacts: no  Context: worse Recurrent sinusitis: no Relief with OTC cold/cough medications: no  Treatments attempted: cold/sinus   Relevant past medical, surgical, family and social history reviewed and updated as indicated. Interim medical history since our last visit reviewed. Allergies and medications reviewed and updated.  Review of Systems  Constitutional: Positive for fatigue. Negative for activity change, appetite change, chills, diaphoresis, fever and unexpected weight change.  HENT: Positive for congestion, postnasal drip, rhinorrhea, sinus pressure and sore throat. Negative for dental problem, drooling, ear discharge, ear pain, facial swelling, hearing loss, mouth sores, nosebleeds, sinus pain, sneezing, tinnitus, trouble swallowing and voice change.   Respiratory: Positive for cough, chest tightness and shortness of breath. Negative for apnea, choking, wheezing and stridor.   Cardiovascular: Negative.   Gastrointestinal: Negative.   Psychiatric/Behavioral: Negative.     Per HPI unless  specifically indicated above     Objective:    Pulse (!) 103   Wt Readings from Last 3 Encounters:  03/24/20 252 lb (114.3 kg)  09/20/19 242 lb (109.8 kg)  08/10/19 249 lb (112.9 kg)    Physical Exam Constitutional:      General: He is not in acute distress.    Appearance: Normal appearance. He is well-developed. He is obese. He is not ill-appearing, toxic-appearing or diaphoretic.  HENT:     Head: Normocephalic and atraumatic.     Right Ear: Hearing and external ear normal.     Left Ear: Hearing and external ear normal.     Nose: Nose normal.  Eyes:     General: Lids are normal. No scleral icterus.       Right eye: No discharge.        Left eye: No discharge.     Extraocular Movements: Extraocular movements intact.     Conjunctiva/sclera: Conjunctivae normal.     Pupils: Pupils are equal, round, and reactive to light.  Pulmonary:     Effort: Pulmonary effort is normal. No respiratory distress.  Musculoskeletal:        General: Normal range of motion.     Cervical back: Normal range of motion.  Skin:    Coloration: Skin is not jaundiced or pale.     Findings: No bruising, erythema, lesion or rash.  Neurological:     General: No focal deficit present.     Mental Status: He is alert and oriented to person, place, and time.  Psychiatric:        Mood and Affect: Mood normal.        Speech: Speech normal.  Behavior: Behavior normal.        Thought Content: Thought content normal.        Judgment: Judgment normal.     Results for orders placed or performed in visit on 03/24/20  Bayer DCA Hb A1c Waived  Result Value Ref Range   HB A1C (BAYER DCA - WAIVED) 7.6 (H) <7.0 %  Comprehensive metabolic panel  Result Value Ref Range   Glucose 174 (H) 65 - 99 mg/dL   BUN 10 6 - 24 mg/dL   Creatinine, Ser 1.01 0.76 - 1.27 mg/dL   GFR calc non Af Amer 91 >59 mL/min/1.73   GFR calc Af Amer 105 >59 mL/min/1.73   BUN/Creatinine Ratio 10 9 - 20   Sodium 137 134 - 144 mmol/L    Potassium 4.0 3.5 - 5.2 mmol/L   Chloride 99 96 - 106 mmol/L   CO2 25 20 - 29 mmol/L   Calcium 9.3 8.7 - 10.2 mg/dL   Total Protein 7.1 6.0 - 8.5 g/dL   Albumin 4.4 4.0 - 5.0 g/dL   Globulin, Total 2.7 1.5 - 4.5 g/dL   Albumin/Globulin Ratio 1.6 1.2 - 2.2   Bilirubin Total 0.7 0.0 - 1.2 mg/dL   Alkaline Phosphatase 82 44 - 121 IU/L   AST 14 0 - 40 IU/L   ALT 22 0 - 44 IU/L  Lipid Panel w/o Chol/HDL Ratio  Result Value Ref Range   Cholesterol, Total 215 (H) 100 - 199 mg/dL   Triglycerides 353 (H) 0 - 149 mg/dL   HDL 33 (L) >39 mg/dL   VLDL Cholesterol Cal 62 (H) 5 - 40 mg/dL   LDL Chol Calc (NIH) 120 (H) 0 - 99 mg/dL      Assessment & Plan:   Problem List Items Addressed This Visit   None   Visit Diagnoses    Viral upper respiratory tract infection    -  Primary   Will treat with prednisone and tussionex. COVID negative at home. If not better in 5 days, start z-pack- sent in. Call with any concerns or if not better.   Relevant Medications   azithromycin (ZITHROMAX) 250 MG tablet       Follow up plan: Return if symptoms worsen or fail to improve.   . This visit was completed via MyChart due to the restrictions of the COVID-19 pandemic. All issues as above were discussed and addressed. Physical exam was done as above through visual confirmation on MyChart. If it was felt that the patient should be evaluated in the office, they were directed there. The patient verbally consented to this visit. . Location of the patient: home . Location of the provider: work . Those involved with this call:  . Provider: Park Liter, DO . CMA: Louanna Raw, [place] . Front Desk/Registration: Jill Side  . Time spent on call: 15 minutes with patient face to face via video conference. More than 50% of this time was spent in counseling and coordination of care. 23 minutes total spent in review of patient's record and preparation of their chart.

## 2020-05-22 DIAGNOSIS — Z20822 Contact with and (suspected) exposure to covid-19: Secondary | ICD-10-CM | POA: Diagnosis not present

## 2020-05-22 DIAGNOSIS — Z03818 Encounter for observation for suspected exposure to other biological agents ruled out: Secondary | ICD-10-CM | POA: Diagnosis not present

## 2020-06-26 ENCOUNTER — Ambulatory Visit: Payer: BC Managed Care – PPO | Admitting: Family Medicine

## 2020-06-26 DIAGNOSIS — L400 Psoriasis vulgaris: Secondary | ICD-10-CM | POA: Diagnosis not present

## 2020-06-26 DIAGNOSIS — L814 Other melanin hyperpigmentation: Secondary | ICD-10-CM | POA: Diagnosis not present

## 2020-07-03 ENCOUNTER — Encounter: Payer: Self-pay | Admitting: Family Medicine

## 2020-07-03 ENCOUNTER — Ambulatory Visit: Payer: BC Managed Care – PPO | Admitting: Family Medicine

## 2020-07-03 ENCOUNTER — Other Ambulatory Visit: Payer: Self-pay

## 2020-07-03 VITALS — BP 146/98 | HR 105 | Temp 98.8°F | Wt 247.8 lb

## 2020-07-03 DIAGNOSIS — E1129 Type 2 diabetes mellitus with other diabetic kidney complication: Secondary | ICD-10-CM

## 2020-07-03 DIAGNOSIS — R809 Proteinuria, unspecified: Secondary | ICD-10-CM | POA: Diagnosis not present

## 2020-07-03 LAB — BAYER DCA HB A1C WAIVED: HB A1C (BAYER DCA - WAIVED): 7 % — ABNORMAL HIGH (ref <7.0)

## 2020-07-03 NOTE — Progress Notes (Signed)
BP (!) 146/98   Pulse (!) 105   Temp 98.8 F (37.1 C)   Wt 247 lb 12.8 oz (112.4 kg)   SpO2 96%   BMI 36.16 kg/m    Subjective:    Patient ID: Casey Cole, male    DOB: [birth year], 45 y.o.   MRN: [id number]  HPI: Casey Cole is a 45 y.o. male  Chief Complaint  Patient presents with  . Diabetes   DIABETES Hypoglycemic episodes:no Polydipsia/polyuria: no Visual disturbance: no Chest pain: no Paresthesias: no Glucose Monitoring: no  Accucheck frequency: very rarely  Fasting glucose: 140-150 Taking Insulin?: no Blood Pressure Monitoring: not checking Retinal Examination: Not up to Date Foot Exam: Up to Date Diabetic Education: Completed Pneumovax: Up to Date Influenza: Not up to Date Aspirin: no  Relevant past medical, surgical, family and social history reviewed and updated as indicated. Interim medical history since our last visit reviewed. Allergies and medications reviewed and updated.  Review of Systems  Constitutional: Negative.   Respiratory: Negative.   Cardiovascular: Negative.   Gastrointestinal: Negative.   Musculoskeletal: Negative.   Psychiatric/Behavioral: Negative.     Per HPI unless specifically indicated above     Objective:    BP (!) 146/98   Pulse (!) 105   Temp 98.8 F (37.1 C)   Wt 247 lb 12.8 oz (112.4 kg)   SpO2 96%   BMI 36.16 kg/m   Wt Readings from Last 3 Encounters:  07/03/20 247 lb 12.8 oz (112.4 kg)  03/24/20 252 lb (114.3 kg)  09/20/19 242 lb (109.8 kg)    Physical Exam Vitals and nursing note reviewed.  Constitutional:      General: He is not in acute distress.    Appearance: Normal appearance. He is not ill-appearing, toxic-appearing or diaphoretic.  HENT:     Head: Normocephalic and atraumatic.     Right Ear: External ear normal.     Left Ear: External ear normal.     Nose: Nose normal.     Mouth/Throat:     Mouth: Mucous membranes are moist.     Pharynx: Oropharynx is clear.  Eyes:     General: No  scleral icterus.       Right eye: No discharge.        Left eye: No discharge.     Extraocular Movements: Extraocular movements intact.     Conjunctiva/sclera: Conjunctivae normal.     Pupils: Pupils are equal, round, and reactive to light.  Cardiovascular:     Rate and Rhythm: Normal rate and regular rhythm.     Pulses: Normal pulses.     Heart sounds: Normal heart sounds. No murmur heard. No friction rub. No gallop.   Pulmonary:     Effort: Pulmonary effort is normal. No respiratory distress.     Breath sounds: Normal breath sounds. No stridor. No wheezing, rhonchi or rales.  Chest:     Chest wall: No tenderness.  Musculoskeletal:        General: Normal range of motion.     Cervical back: Normal range of motion and neck supple.  Skin:    General: Skin is warm and dry.     Capillary Refill: Capillary refill takes less than 2 seconds.     Coloration: Skin is not jaundiced or pale.     Findings: No bruising, erythema, lesion or rash.  Neurological:     General: No focal deficit present.     Mental Status: He is alert and oriented to  person, place, and time. Mental status is at baseline.  Psychiatric:        Mood and Affect: Mood normal.        Behavior: Behavior normal.        Thought Content: Thought content normal.        Judgment: Judgment normal.     Results for orders placed or performed in visit on 03/24/20  Bayer DCA Hb A1c Waived  Result Value Ref Range   HB A1C (BAYER DCA - WAIVED) 7.6 (H) <7.0 %  Comprehensive metabolic panel  Result Value Ref Range   Glucose 174 (H) 65 - 99 mg/dL   BUN 10 6 - 24 mg/dL   Creatinine, Ser 1.01 0.76 - 1.27 mg/dL   GFR calc non Af Amer 91 >59 mL/min/1.73   GFR calc Af Amer 105 >59 mL/min/1.73   BUN/Creatinine Ratio 10 9 - 20   Sodium 137 134 - 144 mmol/L   Potassium 4.0 3.5 - 5.2 mmol/L   Chloride 99 96 - 106 mmol/L   CO2 25 20 - 29 mmol/L   Calcium 9.3 8.7 - 10.2 mg/dL   Total Protein 7.1 6.0 - 8.5 g/dL   Albumin 4.4 4.0 -  5.0 g/dL   Globulin, Total 2.7 1.5 - 4.5 g/dL   Albumin/Globulin Ratio 1.6 1.2 - 2.2   Bilirubin Total 0.7 0.0 - 1.2 mg/dL   Alkaline Phosphatase 82 44 - 121 IU/L   AST 14 0 - 40 IU/L   ALT 22 0 - 44 IU/L  Lipid Panel w/o Chol/HDL Ratio  Result Value Ref Range   Cholesterol, Total 215 (H) 100 - 199 mg/dL   Triglycerides 353 (H) 0 - 149 mg/dL   HDL 33 (L) >39 mg/dL   VLDL Cholesterol Cal 62 (H) 5 - 40 mg/dL   LDL Chol Calc (NIH) 120 (H) 0 - 99 mg/dL      Assessment & Plan:   Problem List Items Addressed This Visit      Endocrine   DM (diabetes mellitus), type 2 with renal complications ([place]) - Primary    Doing great with A1c of 7.0. Continue current regimen. Call with any concerns. Recheck 3 months.       Relevant Orders   Bayer DCA Hb A1c Waived       Follow up plan: Return in about 3 months (around 09/30/2020) for physical.

## 2020-07-03 NOTE — Assessment & Plan Note (Signed)
Doing great with A1c of 7.0. Continue current regimen. Call with any concerns. Recheck 3 months.

## 2020-07-11 DIAGNOSIS — M5412 Radiculopathy, cervical region: Secondary | ICD-10-CM | POA: Diagnosis not present

## 2020-07-18 ENCOUNTER — Encounter: Payer: Self-pay | Admitting: Family Medicine

## 2020-07-19 NOTE — Telephone Encounter (Signed)
Called pt advised of Jolene's message he states that he will decide and call us back as to his boss was calling

## 2020-07-19 NOTE — Telephone Encounter (Signed)
Please advise pt didn't mention sore throat or headache in message    Copied from [place] [id number]. Topic: General - Inquiry >> Jul 19, 2020  8:56 AM Scherrie Gerlach wrote: Reason for CRM: pt called this am to make appt.  I see in the chart he sent mychart message about a drug reaction he may be experiencing.  When I went through the DT, pt told me he had sore throat and headache.  I advised him this would need to be a virtual visit due to his sx.  Pt said "well that won't do me a bit of good" and hung up on me.

## 2020-08-03 ENCOUNTER — Other Ambulatory Visit: Payer: Self-pay

## 2020-08-03 ENCOUNTER — Ambulatory Visit: Payer: BC Managed Care – PPO | Admitting: Family Medicine

## 2020-08-03 ENCOUNTER — Encounter: Payer: Self-pay | Admitting: Family Medicine

## 2020-08-03 VITALS — BP 113/72 | HR 108 | Temp 98.0°F | Wt 243.4 lb

## 2020-08-03 DIAGNOSIS — R82998 Other abnormal findings in urine: Secondary | ICD-10-CM | POA: Diagnosis not present

## 2020-08-03 DIAGNOSIS — R809 Proteinuria, unspecified: Secondary | ICD-10-CM

## 2020-08-03 DIAGNOSIS — E1129 Type 2 diabetes mellitus with other diabetic kidney complication: Secondary | ICD-10-CM

## 2020-08-03 LAB — CBC WITH DIFFERENTIAL/PLATELET
Hematocrit: 42.4 % (ref 37.5–51.0)
Hemoglobin: 14.3 g/dL (ref 13.0–17.7)
Lymphocytes Absolute: 6.7 10*3/uL — ABNORMAL HIGH (ref 0.7–3.1)
Lymphs: 63 %
MCH: 31.2 pg (ref 26.6–33.0)
MCHC: 33.7 g/dL (ref 31.5–35.7)
MCV: 92 fL (ref 79–97)
MID (Absolute): 0.8 10*3/uL (ref 0.1–1.6)
MID: 8 %
Neutrophils Absolute: 3.1 10*3/uL (ref 1.4–7.0)
Neutrophils: 29 %
Platelets: 116 10*3/uL — ABNORMAL LOW (ref 150–450)
RBC: 4.59 x10E6/uL (ref 4.14–5.80)
RDW: 14.1 % (ref 11.6–15.4)
WBC: 10.6 10*3/uL (ref 3.4–10.8)

## 2020-08-03 LAB — MICROSCOPIC EXAMINATION
Bacteria, UA: NONE SEEN
Epithelial Cells (non renal): NONE SEEN /hpf (ref 0–10)
RBC, Urine: NONE SEEN /hpf (ref 0–2)
WBC, UA: NONE SEEN /hpf (ref 0–5)

## 2020-08-03 LAB — URINALYSIS, ROUTINE W REFLEX MICROSCOPIC
Bilirubin, UA: NEGATIVE
Ketones, UA: NEGATIVE
Leukocytes,UA: NEGATIVE
Nitrite, UA: NEGATIVE
RBC, UA: NEGATIVE
Specific Gravity, UA: 1.025 (ref 1.005–1.030)
Urobilinogen, Ur: 8 mg/dL — ABNORMAL HIGH (ref 0.2–1.0)
pH, UA: 6 (ref 5.0–7.5)

## 2020-08-03 LAB — BAYER DCA HB A1C WAIVED: HB A1C (BAYER DCA - WAIVED): 8.4 % — ABNORMAL HIGH (ref <7.0)

## 2020-08-03 NOTE — Addendum Note (Signed)
Addended by: Valerie Roys on: 08/03/2020 02:24 PM   Modules accepted: Orders

## 2020-08-03 NOTE — Assessment & Plan Note (Signed)
Acutely up with A1c of 8.4 up from last month at 7.0. Likely due to the methylprednisolone. Will await kidney function- will likely increase his metformin if kidney function is OK.

## 2020-08-03 NOTE — Progress Notes (Signed)
BP 113/72   Pulse (!) 108   Temp 98 F (36.7 C)   Wt 243 lb 6.4 oz (110.4 kg)   SpO2 96%   BMI 35.52 kg/m    Subjective:    Patient ID: Casey Cole, male    DOB: 1976-01-24, 45 y.o.   MRN: [id number]  HPI: Casey Cole is a 45 y.o. male  Chief Complaint  Patient presents with  . dark urine    Patient states he noticed dark urine after starting methylprednisolone on March 8. Has a decreased appetite since then as well. Patient states he has a dry mouth, constantly thirsty.    Was started on methylprednisolone at [facility] about a month ago and had burning tongue and has been having dark urine since then.   URINARY SYMPTOMS Duration: 4 weeks Dysuria: no Urinary frequency: no Urgency: no Small volume voids: no Symptom severity: moderate Urinary incontinence: no Foul odor: no Hematuria: no Abdominal pain: no Back pain: no Suprapubic pain/pressure: no Flank pain: no Fever:  no - chills and sweats Vomiting: no Relief with cranberry juice: no Relief with pyridium: no Status: better/worse/stable Previous urinary tract infection: no Recurrent urinary tract infection: no History of sexually transmitted disease: no Penile discharge: no Treatments attempted: increasing fluids    Relevant past medical, surgical, family and social history reviewed and updated as indicated. Interim medical history since our last visit reviewed. Allergies and medications reviewed and updated.  Review of Systems  Constitutional: Negative.   Respiratory: Negative.   Cardiovascular: Negative.   Gastrointestinal: Negative.   Genitourinary: Positive for decreased urine volume. Negative for difficulty urinating, dysuria, enuresis, flank pain, frequency, genital sores, hematuria, penile discharge, penile pain, penile swelling, scrotal swelling, testicular pain and urgency.  Musculoskeletal: Negative.   Neurological: Negative.   Psychiatric/Behavioral: Negative.     Per HPI unless  specifically indicated above     Objective:    BP 113/72   Pulse (!) 108   Temp 98 F (36.7 C)   Wt 243 lb 6.4 oz (110.4 kg)   SpO2 96%   BMI 35.52 kg/m   Wt Readings from Last 3 Encounters:  08/03/20 243 lb 6.4 oz (110.4 kg)  07/03/20 247 lb 12.8 oz (112.4 kg)  03/24/20 252 lb (114.3 kg)    Physical Exam Vitals and nursing note reviewed.  Constitutional:      General: He is not in acute distress.    Appearance: Normal appearance. He is not ill-appearing, toxic-appearing or diaphoretic.  HENT:     Head: Normocephalic and atraumatic.     Right Ear: External ear normal.     Left Ear: External ear normal.     Nose: Nose normal.     Mouth/Throat:     Mouth: Mucous membranes are moist.     Pharynx: Oropharynx is clear.  Eyes:     General: No scleral icterus.       Right eye: No discharge.        Left eye: No discharge.     Extraocular Movements: Extraocular movements intact.     Conjunctiva/sclera: Conjunctivae normal.     Pupils: Pupils are equal, round, and reactive to light.  Cardiovascular:     Rate and Rhythm: Normal rate and regular rhythm.     Pulses: Normal pulses.     Heart sounds: Normal heart sounds. No murmur heard. No friction rub. No gallop.   Pulmonary:     Effort: Pulmonary effort is normal. No respiratory distress.  Breath sounds: Normal breath sounds. No stridor. No wheezing, rhonchi or rales.  Chest:     Chest wall: No tenderness.  Musculoskeletal:        General: Normal range of motion.     Cervical back: Normal range of motion and neck supple.  Skin:    General: Skin is warm and dry.     Capillary Refill: Capillary refill takes less than 2 seconds.     Coloration: Skin is not jaundiced or pale.     Findings: No bruising, erythema, lesion or rash.  Neurological:     General: No focal deficit present.     Mental Status: He is alert and oriented to person, place, and time. Mental status is at baseline.  Psychiatric:        Mood and Affect:  Mood normal.        Behavior: Behavior normal.        Thought Content: Thought content normal.        Judgment: Judgment normal.     Results for orders placed or performed in visit on 07/03/20  Bayer DCA Hb A1c Waived  Result Value Ref Range   HB A1C (BAYER DCA - WAIVED) 7.0 (H) <7.0 %      Assessment & Plan:   Problem List Items Addressed This Visit      Endocrine   DM (diabetes mellitus), type 2 with renal complications ([place])    Acutely up with A1c of 8.4 up from last month at 7.0. Likely due to the methylprednisolone. Will await kidney function- will likely increase his metformin if kidney function is OK.       Other Visit Diagnoses    Dark urine    -  Primary   Concern for kidney function. Will increase fluids and await results. Call with any concerns.    Relevant Orders   Comprehensive metabolic panel   CBC with Differential/Platelet   Bayer DCA Hb A1c Waived   Urinalysis, Routine w reflex microscopic   Urine Culture       Follow up plan: Return as scheduled.

## 2020-08-04 ENCOUNTER — Other Ambulatory Visit: Payer: Self-pay | Admitting: Family Medicine

## 2020-08-04 DIAGNOSIS — R945 Abnormal results of liver function studies: Secondary | ICD-10-CM

## 2020-08-04 LAB — COMPREHENSIVE METABOLIC PANEL
ALT: 113 IU/L — ABNORMAL HIGH (ref 0–44)
AST: 70 IU/L — ABNORMAL HIGH (ref 0–40)
Albumin/Globulin Ratio: 1.6 (ref 1.2–2.2)
Albumin: 4.2 g/dL (ref 4.0–5.0)
Alkaline Phosphatase: 263 IU/L — ABNORMAL HIGH (ref 44–121)
BUN/Creatinine Ratio: 4 — ABNORMAL LOW (ref 9–20)
BUN: 5 mg/dL — ABNORMAL LOW (ref 6–24)
Bilirubin Total: 2.2 mg/dL — ABNORMAL HIGH (ref 0.0–1.2)
CO2: 23 mmol/L (ref 20–29)
Calcium: 9.1 mg/dL (ref 8.7–10.2)
Chloride: 101 mmol/L (ref 96–106)
Creatinine, Ser: 1.15 mg/dL (ref 0.76–1.27)
Globulin, Total: 2.7 g/dL (ref 1.5–4.5)
Glucose: 130 mg/dL — ABNORMAL HIGH (ref 65–99)
Potassium: 4.1 mmol/L (ref 3.5–5.2)
Sodium: 140 mmol/L (ref 134–144)
Total Protein: 6.9 g/dL (ref 6.0–8.5)
eGFR: 81 mL/min/{1.73_m2} (ref >59)

## 2020-08-04 MED ORDER — METFORMIN HCL ER (OSM) 1000 MG PO TB24: 1000.0000 mg | ORAL_TABLET | Freq: Two times a day (BID) | ORAL | 1 refills | Status: DC

## 2020-08-05 LAB — URINE CULTURE

## 2020-08-07 ENCOUNTER — Telehealth (INDEPENDENT_AMBULATORY_CARE_PROVIDER_SITE_OTHER): Payer: BC Managed Care – PPO | Admitting: Family Medicine

## 2020-08-07 ENCOUNTER — Encounter: Payer: Self-pay | Admitting: Family Medicine

## 2020-08-07 VITALS — Temp 100.0°F

## 2020-08-07 DIAGNOSIS — J029 Acute pharyngitis, unspecified: Secondary | ICD-10-CM | POA: Diagnosis not present

## 2020-08-07 DIAGNOSIS — J069 Acute upper respiratory infection, unspecified: Secondary | ICD-10-CM

## 2020-08-07 MED ORDER — LEVOCETIRIZINE DIHYDROCHLORIDE 5 MG PO TABS: 5.0000 mg | ORAL_TABLET | Freq: Every evening | ORAL | 3 refills | Status: DC

## 2020-08-07 MED ORDER — PREDNISONE 50 MG PO TABS: 50.0000 mg | ORAL_TABLET | Freq: Every day | ORAL | 0 refills | Status: DC

## 2020-08-07 NOTE — Progress Notes (Signed)
Temp 100 F (37.8 C) (Oral)    Subjective:    Patient ID: Casey Cole, male    DOB: 1976/02/06, 45 y.o.   MRN: [id number]  HPI: Casey Cole is a 45 y.o. male  Chief Complaint  Patient presents with  . URI    Pt states he has been having a sore throat, ear ache, and headache that started Friday. Not much cough or congestion per patient. No contact with COVID per patient.    UPPER RESPIRATORY TRACT INFECTION Duration: 3 days Worst symptom: sore throat and ear pain Fever: yes Cough: yes Shortness of breath: no Wheezing: no Chest pain: no Chest tightness: no Chest congestion: no Nasal congestion: no Runny nose: no Post nasal drip: no Sneezing: no Sore throat: yes Swollen glands: yes Sinus pressure: no Headache: no Face pain: no Toothache: no Ear pain: yes bilateral Ear pressure: yes bilateral Eyes red/itching:no Eye drainage/crusting: no  Vomiting: no Rash: no Fatigue: yes Sick contacts: no Strep contacts: no  Context: worse Recurrent sinusitis: no Relief with OTC cold/cough medications: no  Treatments attempted: anti-histamine   Relevant past medical, surgical, family and social history reviewed and updated as indicated. Interim medical history since our last visit reviewed. Allergies and medications reviewed and updated.  Review of Systems  Constitutional: Positive for fatigue and fever. Negative for activity change, appetite change, chills, diaphoresis and unexpected weight change.  HENT: Negative.   Respiratory: Negative.   Cardiovascular: Negative.   Gastrointestinal: Negative.   Musculoskeletal: Negative.   Psychiatric/Behavioral: Negative.     Per HPI unless specifically indicated above     Objective:    Temp 100 F (37.8 C) (Oral)   Wt Readings from Last 3 Encounters:  08/03/20 243 lb 6.4 oz (110.4 kg)  07/03/20 247 lb 12.8 oz (112.4 kg)  03/24/20 252 lb (114.3 kg)    Physical Exam Vitals and nursing note reviewed.  Constitutional:       General: He is not in acute distress.    Appearance: Normal appearance. He is not ill-appearing, toxic-appearing or diaphoretic.  HENT:     Head: Normocephalic and atraumatic.     Right Ear: External ear normal.     Left Ear: External ear normal.     Nose: Nose normal.     Mouth/Throat:     Mouth: Mucous membranes are moist.     Pharynx: Oropharynx is clear.  Eyes:     General: No scleral icterus.       Right eye: No discharge.        Left eye: No discharge.     Conjunctiva/sclera: Conjunctivae normal.     Pupils: Pupils are equal, round, and reactive to light.  Pulmonary:     Effort: Pulmonary effort is normal. No respiratory distress.     Comments: Speaking in full sentences Musculoskeletal:        General: Normal range of motion.     Cervical back: Normal range of motion.  Skin:    Coloration: Skin is not jaundiced or pale.     Findings: No bruising, erythema, lesion or rash.  Neurological:     Mental Status: He is alert and oriented to person, place, and time. Mental status is at baseline.  Psychiatric:        Mood and Affect: Mood normal.        Behavior: Behavior normal.        Thought Content: Thought content normal.        Judgment: Judgment normal.  Results for orders placed or performed in visit on 08/03/20  Urine Culture   Specimen: Urine   UR  Result Value Ref Range   Urine Culture, Routine Final report    Organism ID, Bacteria Comment   Microscopic Examination   Urine  Result Value Ref Range   WBC, UA None seen 0 - 5 /hpf   RBC None seen 0 - 2 /hpf   Epithelial Cells (non renal) None seen 0 - 10 /hpf   Mucus, UA Present Not Estab.   Bacteria, UA None seen None seen/Few  Comprehensive metabolic panel  Result Value Ref Range   Glucose 130 (H) 65 - 99 mg/dL   BUN 5 (L) 6 - 24 mg/dL   Creatinine, Ser 1.15 0.76 - 1.27 mg/dL   eGFR 81 >59 mL/min/1.73   BUN/Creatinine Ratio 4 (L) 9 - 20   Sodium 140 134 - 144 mmol/L   Potassium 4.1 3.5 - 5.2  mmol/L   Chloride 101 96 - 106 mmol/L   CO2 23 20 - 29 mmol/L   Calcium 9.1 8.7 - 10.2 mg/dL   Total Protein 6.9 6.0 - 8.5 g/dL   Albumin 4.2 4.0 - 5.0 g/dL   Globulin, Total 2.7 1.5 - 4.5 g/dL   Albumin/Globulin Ratio 1.6 1.2 - 2.2   Bilirubin Total 2.2 (H) 0.0 - 1.2 mg/dL   Alkaline Phosphatase 263 (H) 44 - 121 IU/L   AST 70 (H) 0 - 40 IU/L   ALT 113 (H) 0 - 44 IU/L  Bayer DCA Hb A1c Waived  Result Value Ref Range   HB A1C (BAYER DCA - WAIVED) 8.4 (H) <7.0 %  Urinalysis, Routine w reflex microscopic  Result Value Ref Range   Specific Gravity, UA 1.025 1.005 - 1.030   pH, UA 6.0 5.0 - 7.5   Color, UA Orange Yellow   Appearance Ur Clear Clear   Leukocytes,UA Negative Negative   Protein,UA 1+ (A) Negative/Trace   Glucose, UA Trace (A) Negative   Ketones, UA Negative Negative   RBC, UA Negative Negative   Bilirubin, UA Negative Negative   Urobilinogen, Ur 8.0 (H) 0.2 - 1.0 mg/dL   Nitrite, UA Negative Negative   Microscopic Examination See below:   CBC With Differential/Platelet  Result Value Ref Range   WBC 10.6 3.4 - 10.8 x10E3/uL   RBC 4.59 4.14 - 5.80 x10E6/uL   Hemoglobin 14.3 13.0 - 17.7 g/dL   Hematocrit 42.4 37.5 - 51.0 %   MCV 92 79 - 97 fL   MCH 31.2 26.6 - 33.0 pg   MCHC 33.7 31.5 - 35.7 g/dL   RDW 14.1 11.6 - 15.4 %   Platelets 116 (L) 150 - 450 x10E3/uL   Neutrophils 29 Not Estab. %   Lymphs 63 Not Estab. %   MID 8 Not Estab. %   Neutrophils Absolute 3.1 1.4 - 7.0 x10E3/uL   Lymphocytes Absolute 6.7 (H) 0.7 - 3.1 x10E3/uL   MID (Absolute) 0.8 0.1 - 1.6 X10E3/uL      Assessment & Plan:   Problem List Items Addressed This Visit   None   Visit Diagnoses    Upper respiratory tract infection, unspecified type    -  Primary   Will treat with prednisone and xyzal. Call if not getting better or getting worse. Continue to monitor.        Follow up plan: Return if symptoms worsen or fail to improve.   . This visit was completed via MyChart due to  the  restrictions of the COVID-19 pandemic. All issues as above were discussed and addressed. Physical exam was done as above through visual confirmation on MyChart. If it was felt that the patient should be evaluated in the office, they were directed there. The patient verbally consented to this visit. . Location of the patient: home . Location of the provider: work . Those involved with this call:  . Provider: Park Liter, DO . CMA: Yvonna Alanis, [place] . Front Desk/Registration: Jill Side  . Time spent on call: 15 minutes with patient face to face via video conference. More than 50% of this time was spent in counseling and coordination of care. 23 minutes total spent in review of patient's record and preparation of their chart.

## 2020-08-17 ENCOUNTER — Other Ambulatory Visit: Payer: BC Managed Care – PPO

## 2020-08-17 ENCOUNTER — Other Ambulatory Visit: Payer: Self-pay

## 2020-08-17 DIAGNOSIS — R945 Abnormal results of liver function studies: Secondary | ICD-10-CM | POA: Diagnosis not present

## 2020-08-18 LAB — COMPREHENSIVE METABOLIC PANEL
ALT: 55 IU/L — ABNORMAL HIGH (ref 0–44)
AST: 28 IU/L (ref 0–40)
Albumin/Globulin Ratio: 1.4 (ref 1.2–2.2)
Albumin: 4.3 g/dL (ref 4.0–5.0)
Alkaline Phosphatase: 153 IU/L — ABNORMAL HIGH (ref 44–121)
BUN/Creatinine Ratio: 12 (ref 9–20)
BUN: 10 mg/dL (ref 6–24)
Bilirubin Total: 1.5 mg/dL — ABNORMAL HIGH (ref 0.0–1.2)
CO2: 22 mmol/L (ref 20–29)
Calcium: 9.1 mg/dL (ref 8.7–10.2)
Chloride: 98 mmol/L (ref 96–106)
Creatinine, Ser: 0.86 mg/dL (ref 0.76–1.27)
Globulin, Total: 3.1 g/dL (ref 1.5–4.5)
Glucose: 211 mg/dL — ABNORMAL HIGH (ref 65–99)
Potassium: 4.3 mmol/L (ref 3.5–5.2)
Sodium: 137 mmol/L (ref 134–144)
Total Protein: 7.4 g/dL (ref 6.0–8.5)
eGFR: 110 mL/min/{1.73_m2} (ref >59)

## 2020-09-13 LAB — HM DIABETES EYE EXAM

## 2020-09-29 ENCOUNTER — Encounter: Payer: Self-pay | Admitting: Family Medicine

## 2020-09-29 ENCOUNTER — Ambulatory Visit: Payer: BC Managed Care – PPO | Admitting: Family Medicine

## 2020-09-29 ENCOUNTER — Telehealth: Payer: Self-pay

## 2020-09-29 ENCOUNTER — Other Ambulatory Visit: Payer: Self-pay

## 2020-09-29 VITALS — BP 103/68 | HR 109 | Temp 98.2°F | Wt 245.0 lb

## 2020-09-29 DIAGNOSIS — I129 Hypertensive chronic kidney disease with stage 1 through stage 4 chronic kidney disease, or unspecified chronic kidney disease: Secondary | ICD-10-CM

## 2020-09-29 DIAGNOSIS — E782 Mixed hyperlipidemia: Secondary | ICD-10-CM | POA: Diagnosis not present

## 2020-09-29 DIAGNOSIS — E1129 Type 2 diabetes mellitus with other diabetic kidney complication: Secondary | ICD-10-CM | POA: Diagnosis not present

## 2020-09-29 DIAGNOSIS — Z0000 Encounter for general adult medical examination without abnormal findings: Secondary | ICD-10-CM | POA: Diagnosis not present

## 2020-09-29 DIAGNOSIS — R809 Proteinuria, unspecified: Secondary | ICD-10-CM

## 2020-09-29 LAB — URINALYSIS, ROUTINE W REFLEX MICROSCOPIC
Bilirubin, UA: NEGATIVE
Ketones, UA: NEGATIVE
Leukocytes,UA: NEGATIVE
Nitrite, UA: NEGATIVE
RBC, UA: NEGATIVE
Specific Gravity, UA: 1.025 (ref 1.005–1.030)
Urobilinogen, Ur: 0.2 mg/dL (ref 0.2–1.0)
pH, UA: 6 (ref 5.0–7.5)

## 2020-09-29 LAB — MICROALBUMIN, URINE WAIVED
Creatinine, Urine Waived: 300 mg/dL (ref 10–300)
Microalb, Ur Waived: 30 mg/L — ABNORMAL HIGH (ref 0–19)
Microalb/Creat Ratio: <30 mg/g (ref <30)

## 2020-09-29 LAB — BAYER DCA HB A1C WAIVED: HB A1C (BAYER DCA - WAIVED): 7.7 % — ABNORMAL HIGH (ref <7.0)

## 2020-09-29 MED ORDER — OZEMPIC (1 MG/DOSE) 2 MG/1.5ML ~~LOC~~ SOPN: 1.0000 mg | PEN_INJECTOR | SUBCUTANEOUS | 1 refills | Status: DC

## 2020-09-29 NOTE — Telephone Encounter (Signed)
Left message at [facility] requesting patient's recent eye exam results.

## 2020-09-29 NOTE — Patient Instructions (Signed)

## 2020-09-29 NOTE — Assessment & Plan Note (Signed)
Better with A1c of 7.7 down from 8.4- would like to really work on diet and exercise and recheck 3 months. If still high, will increase ozempic. Call with any concerns. Continue to monitor. Refills given today.

## 2020-09-29 NOTE — Progress Notes (Signed)
BP 103/68   Pulse (!) 109   Temp 98.2 F (36.8 C)   Wt 245 lb (111.1 kg)   SpO2 95%   BMI 35.76 kg/m    Subjective:    Patient ID: Casey Cole, male    DOB: [birth year], 45 y.o.   MRN: [id number]  HPI: Kehinde Bowdish is a 45 y.o. male presenting on 09/29/2020 for comprehensive medical examination. Current medical complaints include:  DIABETES Hypoglycemic episodes:no Polydipsia/polyuria: no Visual disturbance: no Chest pain: no Paresthesias: no Glucose Monitoring: no  Accucheck frequency: Not Checking Taking Insulin?: no Blood Pressure Monitoring: not checking Retinal Examination: Up to Date Foot Exam: Up to Date Diabetic Education: Completed Pneumovax: Up to Date Influenza: Not up to Date Aspirin: no  HYPERTENSION / [place] Satisfied with current treatment? yes Duration of hypertension: chronic BP monitoring frequency: not checking BP range:  BP medication side effects: not on anything Duration of hyperlipidemia: chronic Cholesterol medication side effects: yes- muscle aches on higher dose Cholesterol supplements: none Past cholesterol medications: atorvastatin Medication compliance: excellent compliance Aspirin: no Recent stressors: no Recurrent headaches: no Visual changes: no Palpitations: no Dyspnea: no Chest pain: no Lower extremity edema: no Dizzy/lightheaded: no  Interim Problems from his last visit: no  Depression Screen done today and results listed below:  Depression screen [facility] 2/9 07/03/2020 03/24/2020 09/20/2019 05/10/2019 10/26/2018  Decreased Interest 0 0 0 1 1  Down, Depressed, Hopeless 0 0 0 0 1  PHQ - 2 Score 0 0 0 1 2  Altered sleeping 0 0 0 1 0  Tired, decreased energy 0 0 0 1 1  Change in appetite 0 0 0 1 1  Feeling bad or failure about yourself  0 0 0 0 0  Trouble concentrating 0 0 0 1 1  Moving slowly or fidgety/restless 0 0 0 0 0  Suicidal thoughts 0 0 0 0 0  PHQ-9 Score 0 0 0 5 5  Difficult doing work/chores Not difficult  at all - Not difficult at all Not difficult at all Somewhat difficult  Some recent data might be hidden    Past Medical History:  Past Medical History:  Diagnosis Date  . Abscess of superficial perineal space 08/23/2015  . Depression 10/27/2015  . Diabetes mellitus without complication ([place])   . Hyperlipidemia   . Hypertension   . Recurrent major depressive disorder, in full remission ([place]) 10/26/2018  . Sleep apnea     Surgical History:  Past Surgical History:  Procedure Laterality Date  . CHOLECYSTECTOMY    . INCISE AND DRAIN ABCESS Left 07-12-15   left buttock  . INCISION AND DRAINAGE PERIRECTAL ABSCESS N/A 08/23/2015   Procedure: IRRIGATION AND DEBRIDEMENT PERIRECTAL ABSCESS;  Surgeon: Robert Bellow, MD;  Location: [facility] ORS;  Service: General;  Laterality: N/A;  Anal Scope  . VASECTOMY      Medications:  Current Outpatient Medications on File Prior to Visit  Medication Sig  . atorvastatin (LIPITOR) 20 MG tablet Take 1 tablet 1-2x a week  . Guselkumab (TREMFYA [place]) Inject into the skin.  Marland Kitchen levocetirizine (XYZAL) 5 MG tablet Take 1 tablet (5 mg total) by mouth every evening.  . metformin (FORTAMET) 1000 MG (OSM) 24 hr tablet Take 1 tablet (1,000 mg total) by mouth 2 (two) times daily with a meal.  . ONE TOUCH ULTRA TEST test strip USE AS DIRECTED ONCE DAILY   No current facility-administered medications on file prior to visit.    Allergies:  Allergies  Allergen Reactions  .  Methylprednisolone     Patient states caused dark urine, chills, and tongue felt burnt     Social History:  Social History   Socioeconomic History  . Marital status: Married    Spouse name: Not on file  . Number of children: Not on file  . Years of education: Not on file  . Highest education level: Not on file  Occupational History  . Not on file  Tobacco Use  . Smoking status: Current Every Day Smoker    Packs/day: 1.00    Types: Cigarettes  . Smokeless tobacco: Never Used  Vaping Use   . Vaping Use: Never used  Substance and Sexual Activity  . Alcohol use: Not Currently  . Drug use: No  . Sexual activity: Yes  Other Topics Concern  . Not on file  Social History Narrative  . Not on file   Social Determinants of Health   Financial Resource Strain: Not on file  Food Insecurity: Not on file  Transportation Needs: Not on file  Physical Activity: Not on file  Stress: Not on file  Social Connections: Not on file  Intimate Partner Violence: Not on file   Social History   Tobacco Use  Smoking Status Current Every Day Smoker  . Packs/day: 1.00  . Types: Cigarettes  Smokeless Tobacco Never Used   Social History   Substance and Sexual Activity  Alcohol Use Not Currently    Family History:  Family History  Problem Relation Age of Onset  . Hypertension Father   . Stroke Paternal Grandfather   . Heart attack Paternal Grandfather     Past medical history, surgical history, medications, allergies, family history and social history reviewed with patient today and changes made to appropriate areas of the chart.   Review of Systems  Constitutional: Negative.   HENT: Negative.   Eyes: Negative.   Respiratory: Negative.   Cardiovascular: Negative.   Gastrointestinal: Negative.   Genitourinary: Negative.   Musculoskeletal: Negative.   Skin: Negative.   Neurological: Negative.   Endo/Heme/Allergies: Negative.   Psychiatric/Behavioral: Negative.    All other ROS negative except what is listed above and in the HPI.      Objective:    BP 103/68   Pulse (!) 109   Temp 98.2 F (36.8 C)   Wt 245 lb (111.1 kg)   SpO2 95%   BMI 35.76 kg/m   Wt Readings from Last 3 Encounters:  09/29/20 245 lb (111.1 kg)  08/03/20 243 lb 6.4 oz (110.4 kg)  07/03/20 247 lb 12.8 oz (112.4 kg)    Physical Exam Vitals and nursing note reviewed.  Constitutional:      General: He is not in acute distress.    Appearance: Normal appearance. He is obese. He is not  ill-appearing, toxic-appearing or diaphoretic.  HENT:     Head: Normocephalic and atraumatic.     Right Ear: Tympanic membrane, ear canal and external ear normal. There is no impacted cerumen.     Left Ear: Tympanic membrane, ear canal and external ear normal. There is no impacted cerumen.     Nose: Nose normal. No congestion or rhinorrhea.     Mouth/Throat:     Mouth: Mucous membranes are moist.     Pharynx: Oropharynx is clear. No oropharyngeal exudate or posterior oropharyngeal erythema.  Eyes:     General: No scleral icterus.       Right eye: No discharge.        Left eye: No discharge.  Extraocular Movements: Extraocular movements intact.     Conjunctiva/sclera: Conjunctivae normal.     Pupils: Pupils are equal, round, and reactive to light.  Neck:     Vascular: No carotid bruit.  Cardiovascular:     Rate and Rhythm: Normal rate and regular rhythm.     Pulses: Normal pulses.     Heart sounds: No murmur heard. No friction rub. No gallop.   Pulmonary:     Effort: Pulmonary effort is normal. No respiratory distress.     Breath sounds: Normal breath sounds. No stridor. No wheezing, rhonchi or rales.  Chest:     Chest wall: No tenderness.  Abdominal:     General: Abdomen is flat. Bowel sounds are normal. There is no distension.     Palpations: Abdomen is soft. There is no mass.     Tenderness: There is no abdominal tenderness. There is no right CVA tenderness, left CVA tenderness, guarding or rebound.     Hernia: A hernia (umbilical) is present.  Genitourinary:    Comments: Genital exam deferred with shared decision making Musculoskeletal:        General: No swelling, tenderness, deformity or signs of injury.     Cervical back: Normal range of motion and neck supple. No rigidity. No muscular tenderness.     Right lower leg: No edema.     Left lower leg: No edema.  Lymphadenopathy:     Cervical: No cervical adenopathy.  Skin:    General: Skin is warm and dry.      Capillary Refill: Capillary refill takes less than 2 seconds.     Coloration: Skin is not jaundiced or pale.     Findings: No bruising, erythema, lesion or rash.  Neurological:     General: No focal deficit present.     Mental Status: He is alert and oriented to person, place, and time.     Cranial Nerves: No cranial nerve deficit.     Sensory: No sensory deficit.     Motor: No weakness.     Coordination: Coordination normal.     Gait: Gait normal.     Deep Tendon Reflexes: Reflexes normal.  Psychiatric:        Mood and Affect: Mood normal.        Behavior: Behavior normal.        Thought Content: Thought content normal.        Judgment: Judgment normal.     Results for orders placed or performed in visit on 08/17/20  Comprehensive metabolic panel  Result Value Ref Range   Glucose 211 (H) 65 - 99 mg/dL   BUN 10 6 - 24 mg/dL   Creatinine, Ser 6.92 0.76 - 1.27 mg/dL   eGFR 390 >74 DD/TCM/6.59   BUN/Creatinine Ratio 12 9 - 20   Sodium 137 134 - 144 mmol/L   Potassium 4.3 3.5 - 5.2 mmol/L   Chloride 98 96 - 106 mmol/L   CO2 22 20 - 29 mmol/L   Calcium 9.1 8.7 - 10.2 mg/dL   Total Protein 7.4 6.0 - 8.5 g/dL   Albumin 4.3 4.0 - 5.0 g/dL   Globulin, Total 3.1 1.5 - 4.5 g/dL   Albumin/Globulin Ratio 1.4 1.2 - 2.2   Bilirubin Total 1.5 (H) 0.0 - 1.2 mg/dL   Alkaline Phosphatase 153 (H) 44 - 121 IU/L   AST 28 0 - 40 IU/L   ALT 55 (H) 0 - 44 IU/L      Assessment & Plan:  Problem List Items Addressed This Visit      Endocrine   DM (diabetes mellitus), type 2 with renal complications ([place])    Better with A1c of 7.7 down from 8.4- would like to really work on diet and exercise and recheck 3 months. If still high, will increase ozempic. Call with any concerns. Continue to monitor. Refills given today.      Relevant Medications   Semaglutide, 1 MG/DOSE, (OZEMPIC, 1 MG/DOSE,) 2 MG/1.5ML SOPN   Other Relevant Orders   Comprehensive metabolic panel   CBC with  Differential/Platelet   Urinalysis, Routine w reflex microscopic   Bayer DCA Hb A1c Waived   Microalbumin, Urine Waived     Genitourinary   Benign hypertensive renal disease    Under good control on current regimen. Continue current regimen. Continue to monitor. Call with any concerns. Labs drawn today.       Relevant Orders   Comprehensive metabolic panel   CBC with Differential/Platelet   TSH   Microalbumin, Urine Waived     Other   Hyperlipidemia    Under good control on current regimen. Continue current regimen. Continue to monitor. Call with any concerns. Labs drawn today.       Relevant Orders   Comprehensive metabolic panel   CBC with Differential/Platelet   Lipid Panel w/o Chol/HDL Ratio    Other Visit Diagnoses    Routine general medical examination at a health care facility    -  Primary   Vaccines up to date. Screening labs checked today. Continue diet and exercise. Call with any concerns.    Relevant Orders   Comprehensive metabolic panel   CBC with Differential/Platelet   Lipid Panel w/o Chol/HDL Ratio   TSH   Urinalysis, Routine w reflex microscopic   Bayer DCA Hb A1c Waived   Microalbumin, Urine Waived       Discussed aspirin prophylaxis for myocardial infarction prevention and decision was it was not indicated  LABORATORY TESTING:  Health maintenance labs ordered today as discussed above.   IMMUNIZATIONS:   - Tdap: Tetanus vaccination status reviewed: last tetanus booster within 10 years. - Influenza: Postponed to flu season - Pneumovax: Up to date - Prevnar: Not applicable - COVID: Up to date   PATIENT COUNSELING:    Sexuality: Discussed sexually transmitted diseases, partner selection, use of condoms, avoidance of unintended pregnancy  and contraceptive alternatives.   Advised to avoid cigarette smoking.  I discussed with the patient that most people either abstain from alcohol or drink within safe limits (<=14/week and <=4  drinks/occasion for males, <=7/weeks and <= 3 drinks/occasion for females) and that the risk for alcohol disorders and other health effects rises proportionally with the number of drinks per week and how often a drinker exceeds daily limits.  Discussed cessation/primary prevention of drug use and availability of treatment for abuse.   Diet: Encouraged to adjust caloric intake to maintain  or achieve ideal body weight, to reduce intake of dietary saturated fat and total fat, to limit sodium intake by avoiding high sodium foods and not adding table salt, and to maintain adequate dietary potassium and calcium preferably from fresh fruits, vegetables, and low-fat dairy products.    stressed the importance of regular exercise  Injury prevention: Discussed safety belts, safety helmets, smoke detector, smoking near bedding or upholstery.   Dental health: Discussed importance of regular tooth brushing, flossing, and dental visits.   Follow up plan: NEXT PREVENTATIVE PHYSICAL DUE IN 1 YEAR. Return in about  3 months (around 12/30/2020).

## 2020-09-29 NOTE — Assessment & Plan Note (Signed)
Under good control on current regimen. Continue current regimen. Continue to monitor. Call with any concerns. Labs drawn today.  

## 2020-09-29 NOTE — Telephone Encounter (Signed)
-----   Message from Valerie Roys, [place] sent at 09/29/2020  3:15 PM EDT ----- Ellin Mayhew eye exam April or May-can we get please?

## 2020-09-30 LAB — COMPREHENSIVE METABOLIC PANEL
ALT: 20 IU/L (ref 0–44)
AST: 13 IU/L (ref 0–40)
Albumin/Globulin Ratio: 1.6 (ref 1.2–2.2)
Albumin: 4.3 g/dL (ref 4.0–5.0)
Alkaline Phosphatase: 81 IU/L (ref 44–121)
BUN/Creatinine Ratio: 6 — ABNORMAL LOW (ref 9–20)
BUN: 6 mg/dL (ref 6–24)
Bilirubin Total: 1 mg/dL (ref 0.0–1.2)
CO2: 24 mmol/L (ref 20–29)
Calcium: 9.4 mg/dL (ref 8.7–10.2)
Chloride: 97 mmol/L (ref 96–106)
Creatinine, Ser: 1.05 mg/dL (ref 0.76–1.27)
Globulin, Total: 2.7 g/dL (ref 1.5–4.5)
Glucose: 243 mg/dL — ABNORMAL HIGH (ref 65–99)
Potassium: 4 mmol/L (ref 3.5–5.2)
Sodium: 136 mmol/L (ref 134–144)
Total Protein: 7 g/dL (ref 6.0–8.5)
eGFR: 90 mL/min/{1.73_m2} (ref >59)

## 2020-09-30 LAB — LIPID PANEL W/O CHOL/HDL RATIO
Cholesterol, Total: 194 mg/dL (ref 100–199)
HDL: 31 mg/dL — ABNORMAL LOW (ref >39)
LDL Chol Calc (NIH): 87 mg/dL (ref 0–99)
Triglycerides: 462 mg/dL — ABNORMAL HIGH (ref 0–149)
VLDL Cholesterol Cal: 76 mg/dL — ABNORMAL HIGH (ref 5–40)

## 2020-09-30 LAB — CBC WITH DIFFERENTIAL/PLATELET
Basophils Absolute: 0.1 10*3/uL (ref 0.0–0.2)
Basos: 1 %
EOS (ABSOLUTE): 0.2 10*3/uL (ref 0.0–0.4)
Eos: 1 %
Hematocrit: 46.4 % (ref 37.5–51.0)
Hemoglobin: 15.4 g/dL (ref 13.0–17.7)
Immature Grans (Abs): 0 10*3/uL (ref 0.0–0.1)
Immature Granulocytes: 0 %
Lymphocytes Absolute: 3.4 10*3/uL — ABNORMAL HIGH (ref 0.7–3.1)
Lymphs: 30 %
MCH: 31.4 pg (ref 26.6–33.0)
MCHC: 33.2 g/dL (ref 31.5–35.7)
MCV: 95 fL (ref 79–97)
Monocytes Absolute: 0.6 10*3/uL (ref 0.1–0.9)
Monocytes: 6 %
Neutrophils Absolute: 6.8 10*3/uL (ref 1.4–7.0)
Neutrophils: 62 %
Platelets: 186 10*3/uL (ref 150–450)
RBC: 4.9 x10E6/uL (ref 4.14–5.80)
RDW: 13.7 % (ref 11.6–15.4)
WBC: 11.1 10*3/uL — ABNORMAL HIGH (ref 3.4–10.8)

## 2020-09-30 LAB — TSH: TSH: 0.37 u[IU]/mL — ABNORMAL LOW (ref 0.450–4.500)

## 2020-10-19 ENCOUNTER — Telehealth: Payer: Self-pay

## 2020-10-19 NOTE — Telephone Encounter (Signed)
Pt presented in office with paperwork to be filled out by provider for his employer. He is needing proof of cpe seen 09/29/20. Form place in back please call when ready

## 2020-10-20 NOTE — Telephone Encounter (Signed)
Placed in your folder for signature 

## 2020-10-23 DIAGNOSIS — L400 Psoriasis vulgaris: Secondary | ICD-10-CM | POA: Diagnosis not present

## 2020-11-28 ENCOUNTER — Encounter: Payer: Self-pay | Admitting: Family Medicine

## 2020-11-28 ENCOUNTER — Telehealth: Payer: BC Managed Care – PPO | Admitting: Family Medicine

## 2020-11-28 VITALS — HR 110 | Wt 240.0 lb

## 2020-11-28 DIAGNOSIS — J069 Acute upper respiratory infection, unspecified: Secondary | ICD-10-CM | POA: Diagnosis not present

## 2020-11-28 MED ORDER — HYDROCOD POLST-CPM POLST ER 10-8 MG/5ML PO SUER: 5.0000 mL | Freq: Two times a day (BID) | ORAL | 0 refills | Status: DC | PRN

## 2020-11-28 MED ORDER — PREDNISONE 10 MG PO TABS: ORAL_TABLET | ORAL | 0 refills | Status: DC

## 2020-11-28 MED ORDER — BENZONATATE 200 MG PO CAPS: 200.0000 mg | ORAL_CAPSULE | Freq: Two times a day (BID) | ORAL | 0 refills | Status: DC | PRN

## 2020-11-28 NOTE — Progress Notes (Signed)
Pulse (!) 110   Wt 240 lb (108.9 kg)   BMI 35.03 kg/m    Subjective:    Patient ID: Casey Cole, male    DOB: 1975-11-29, 45 y.o.   MRN: [id number]  HPI: Casey Cole is a 45 y.o. male  Chief Complaint  Patient presents with   URI    Pt states he has been having cough, runny nose, congestion, drainage, and headache. States symptoms started Friday afternoon.    UPPER RESPIRATORY TRACT INFECTION- covid negative Duration: 4 days Worst symptom: cough Fever: no Cough: yes Shortness of breath: yes Wheezing: yes Chest pain: yes, with cough Chest tightness: yes Chest congestion: yes Nasal congestion: yes Runny nose: yes Post nasal drip: yes Sneezing: no Sore throat: yes Swollen glands: no Sinus pressure: yes Headache: yes Face pain: no Toothache: no Ear pain: no  Ear pressure: no  Eyes red/itching:no Eye drainage/crusting: no  Vomiting: no Rash: no Fatigue: yes Sick contacts: yes Strep contacts: no  Context: worse Recurrent sinusitis: no Relief with OTC cold/cough medications: no  Treatments attempted: cold/sinus   Relevant past medical, surgical, family and social history reviewed and updated as indicated. Interim medical history since our last visit reviewed. Allergies and medications reviewed and updated.  Review of Systems  Constitutional:  Positive for fatigue. Negative for activity change, appetite change, chills, diaphoresis, fever and unexpected weight change.  HENT:  Positive for congestion, postnasal drip, rhinorrhea, sinus pressure and sneezing. Negative for dental problem, drooling, ear discharge, ear pain, facial swelling, hearing loss, mouth sores, nosebleeds, sinus pain, sore throat, tinnitus, trouble swallowing and voice change.   Eyes: Negative.   Respiratory:  Positive for cough, chest tightness, shortness of breath and wheezing. Negative for apnea, choking and stridor.   Cardiovascular: Negative.   Gastrointestinal: Negative.    Musculoskeletal: Negative.   Neurological: Negative.   Psychiatric/Behavioral: Negative.     Per HPI unless specifically indicated above     Objective:    Pulse (!) 110   Wt 240 lb (108.9 kg)   BMI 35.03 kg/m   Wt Readings from Last 3 Encounters:  11/28/20 240 lb (108.9 kg)  09/29/20 245 lb (111.1 kg)  08/03/20 243 lb 6.4 oz (110.4 kg)    Physical Exam Vitals and nursing note reviewed.  Constitutional:      General: He is not in acute distress.    Appearance: Normal appearance. He is not ill-appearing, toxic-appearing or diaphoretic.  HENT:     Head: Normocephalic and atraumatic.     Right Ear: External ear normal.     Left Ear: External ear normal.     Nose: Nose normal.     Mouth/Throat:     Mouth: Mucous membranes are moist.     Pharynx: Oropharynx is clear.  Eyes:     General: No scleral icterus.       Right eye: No discharge.        Left eye: No discharge.     Conjunctiva/sclera: Conjunctivae normal.     Pupils: Pupils are equal, round, and reactive to light.  Pulmonary:     Effort: Pulmonary effort is normal. No respiratory distress.     Comments: Speaking in full sentences Musculoskeletal:        General: Normal range of motion.     Cervical back: Normal range of motion.  Skin:    Coloration: Skin is not jaundiced or pale.     Findings: No bruising, erythema, lesion or rash.  Neurological:  Mental Status: He is alert and oriented to person, place, and time. Mental status is at baseline.  Psychiatric:        Mood and Affect: Mood normal.        Behavior: Behavior normal.        Thought Content: Thought content normal.        Judgment: Judgment normal.    Results for orders placed or performed in visit on 10/04/20  HM DIABETES EYE EXAM  Result Value Ref Range   HM Diabetic Eye Exam No Retinopathy No Retinopathy      Assessment & Plan:   Problem List Items Addressed This Visit   None Visit Diagnoses     Viral upper respiratory tract  infection    -  Primary   Will treat with prednisone, tussionex and tessalon. Call with any concerns or if not getting better. Continue to monitor.         Follow up plan: Return if symptoms worsen or fail to improve.    This visit was completed via video visit through MyChart due to the restrictions of the COVID-19 pandemic. All issues as above were discussed and addressed. Physical exam was done as above through visual confirmation on video through MyChart. If it was felt that the patient should be evaluated in the office, they were directed there. The patient verbally consented to this visit. Location of the patient: home Location of the provider: work Those involved with this call:  Provider: Park Liter, DO CMA: Yvonna Alanis, [place] Desk/Registration: Jill Side  Time spent on call:  15 minutes with patient face to face via video conference. More than 50% of this time was spent in counseling and coordination of care. 23 minutes total spent in review of patient's record and preparation of their chart.

## 2020-11-30 ENCOUNTER — Encounter: Payer: Self-pay | Admitting: Family Medicine

## 2020-11-30 ENCOUNTER — Telehealth: Payer: BC Managed Care – PPO | Admitting: Family Medicine

## 2020-11-30 ENCOUNTER — Ambulatory Visit: Payer: Self-pay | Admitting: *Deleted

## 2020-11-30 ENCOUNTER — Ambulatory Visit
Admission: RE | Admit: 2020-11-30 | Discharge: 2020-11-30 | Disposition: A | Discharge status: Home / Self Care | Payer: BC Managed Care – PPO | Source: Ambulatory Visit | Attending: Family Medicine | Admitting: Family Medicine

## 2020-11-30 ENCOUNTER — Other Ambulatory Visit: Payer: Self-pay | Admitting: Family Medicine

## 2020-11-30 ENCOUNTER — Other Ambulatory Visit: Payer: Self-pay

## 2020-11-30 ENCOUNTER — Ambulatory Visit
Admission: RE | Admit: 2020-11-30 | Discharge: 2020-11-30 | Disposition: A | Discharge status: Home / Self Care | Payer: BC Managed Care – PPO | Attending: Family Medicine | Admitting: Family Medicine

## 2020-11-30 DIAGNOSIS — J069 Acute upper respiratory infection, unspecified: Secondary | ICD-10-CM

## 2020-11-30 DIAGNOSIS — R079 Chest pain, unspecified: Secondary | ICD-10-CM | POA: Diagnosis not present

## 2020-11-30 DIAGNOSIS — R059 Cough, unspecified: Secondary | ICD-10-CM | POA: Diagnosis not present

## 2020-11-30 DIAGNOSIS — R0602 Shortness of breath: Secondary | ICD-10-CM | POA: Diagnosis not present

## 2020-11-30 MED ORDER — ALBUTEROL SULFATE HFA 108 (90 BASE) MCG/ACT IN AERS: 2.0000 | INHALATION_SPRAY | Freq: Four times a day (QID) | RESPIRATORY_TRACT | 0 refills | Status: DC | PRN

## 2020-11-30 MED ORDER — AMOXICILLIN-POT CLAVULANATE 875-125 MG PO TABS: 1.0000 | ORAL_TABLET | Freq: Two times a day (BID) | ORAL | 0 refills | Status: DC

## 2020-11-30 NOTE — Telephone Encounter (Signed)
Pt did not need or request to be triaged by nurse.   I called pt and made sure there wasn't any questions or concerns he had which there was not other than he was not getting any better.   Has an appt this evening at 3:20 with Dr. Wynetta Emery.

## 2020-11-30 NOTE — Progress Notes (Signed)
There were no vitals taken for this visit.   Subjective:    Patient ID: Casey Cole, male    DOB: [birth year], 45 y.o.   MRN: [id number]  HPI: Casey Cole is a 45 y.o. male  Chief Complaint  Patient presents with   URI    Pt states he has had a cough, congestion, headache, and sinus pressure that started Friday    UPPER RESPIRATORY TRACT INFECTION Duration: 6 days Worst symptom: cough, SOB Fever: yes Cough: yes Shortness of breath: yes Wheezing: yes Chest pain: yes, with cough Chest tightness: yes Chest congestion: yes Nasal congestion: yes Runny nose: yes Post nasal drip: yes Sneezing: no Sore throat: no Swollen glands: no Sinus pressure: yes Headache: yes Ear pain: no  Ear pressure: no  Eyes red/itching:no Eye drainage/crusting: no  Vomiting: no Rash: no Fatigue: yes Sick contacts: yes Strep contacts: no  Context: worse Recurrent sinusitis: no Relief with OTC cold/cough medications: no  Treatments attempted: prednisone, tussionex, tessalon   Relevant past medical, surgical, family and social history reviewed and updated as indicated. Interim medical history since our last visit reviewed. Allergies and medications reviewed and updated.  Review of Systems  Constitutional:  Positive for chills, diaphoresis, fatigue and fever. Negative for activity change, appetite change and unexpected weight change.  HENT:  Positive for congestion, postnasal drip, rhinorrhea and sinus pressure. Negative for dental problem, drooling, ear discharge, ear pain, facial swelling, hearing loss, mouth sores, nosebleeds, sinus pain, sneezing, sore throat, tinnitus, trouble swallowing and voice change.   Respiratory:  Positive for cough, chest tightness, shortness of breath and wheezing. Negative for apnea, choking and stridor.   Cardiovascular: Negative.   Gastrointestinal: Negative.   Musculoskeletal: Negative.   Skin: Negative.   Psychiatric/Behavioral: Negative.     Per HPI  unless specifically indicated above     Objective:    There were no vitals taken for this visit.  Wt Readings from Last 3 Encounters:  11/28/20 240 lb (108.9 kg)  09/29/20 245 lb (111.1 kg)  08/03/20 243 lb 6.4 oz (110.4 kg)    Physical Exam Vitals and nursing note reviewed.  Constitutional:      General: He is not in acute distress.    Appearance: Normal appearance. He is ill-appearing. He is not toxic-appearing or diaphoretic.  HENT:     Head: Normocephalic and atraumatic.     Right Ear: External ear normal.     Left Ear: External ear normal.     Nose: Nose normal.     Mouth/Throat:     Mouth: Mucous membranes are moist.     Pharynx: Oropharynx is clear.  Eyes:     General: No scleral icterus.       Right eye: No discharge.        Left eye: No discharge.     Conjunctiva/sclera: Conjunctivae normal.     Pupils: Pupils are equal, round, and reactive to light.  Pulmonary:     Effort: Pulmonary effort is normal. No respiratory distress.     Comments: Speaking in full sentences Musculoskeletal:        General: Normal range of motion.     Cervical back: Normal range of motion.  Skin:    Coloration: Skin is not jaundiced or pale.     Findings: No bruising, erythema, lesion or rash.  Neurological:     Mental Status: He is alert and oriented to person, place, and time. Mental status is at baseline.  Psychiatric:  Mood and Affect: Mood normal.        Behavior: Behavior normal.        Thought Content: Thought content normal.        Judgment: Judgment normal.    Results for orders placed or performed in visit on 10/04/20  HM DIABETES EYE EXAM  Result Value Ref Range   HM Diabetic Eye Exam No Retinopathy No Retinopathy      Assessment & Plan:   Problem List Items Addressed This Visit   None Visit Diagnoses     Viral upper respiratory tract infection    -  Primary   Singificantly worse with SOB- will obtain CXR, continue prednisone, start augmentin and recheck  in person next week.   Relevant Orders   DG Chest 2 View        Follow up plan: Return in about 1 week (around 12/07/2020).    This visit was completed via video visit through MyChart due to the restrictions of the COVID-19 pandemic. All issues as above were discussed and addressed. Physical exam was done as above through visual confirmation on video through MyChart. If it was felt that the patient should be evaluated in the office, they were directed there. The patient verbally consented to this visit. Location of the patient: home Location of the provider: work Those involved with this call:  Provider: Park Liter, DO CMA: Yvonna Alanis, [place] Desk/Registration: Jill Side  Time spent on call:  15 minutes with patient face to face via video conference. More than 50% of this time was spent in counseling and coordination of care. 23 minutes total spent in review of patient's record and preparation of their chart.

## 2020-11-30 NOTE — Telephone Encounter (Signed)
Answer Assessment - Initial Assessment Questions 1. ONSET: "When did the nasal discharge start?"     I've been sick since last Friday.   I did a video visit with Dr. Wynetta Emery on Tues. For the coughing and congestion but I'm not getting any better.   "I have an appt with her this afternoon".    I asked if he had requested a call from the nurse and was there anything I could assist him with?    No, I'm set up to see her this afternoon. No triage done since he is calling in for continued symptoms and has an appt this evening with Dr. Park Liter.    I confirmed his appt for this evening at 3:20.   2. AMOUNT: "How much discharge is there?"      *No Answer* 3. COUGH: "Do you have a cough?" If yes, ask: "Describe the color of your sputum" (clear, white, yellow, green)     *No Answer* 4. RESPIRATORY DISTRESS: "Describe your breathing."      *No Answer* 5. FEVER: "Do you have a fever?" If Yes, ask: "What is your temperature, how was it measured, and when did it start?"     *No Answer* 6. SEVERITY: "Overall, how bad are you feeling right now?" (e.g., doesn't interfere with normal activities, staying home from school/work, staying in bed)      *No Answer* 7. OTHER SYMPTOMS: "Do you have any other symptoms?" (e.g., sore throat, earache, wheezing, vomiting)     *No Answer* 8. PREGNANCY: "Is there any chance you are pregnant?" "When was your last menstrual period?"     *No Answer*  Protocols used: Common Cold-A-AH

## 2020-12-07 ENCOUNTER — Encounter: Payer: Self-pay | Admitting: Family Medicine

## 2020-12-07 ENCOUNTER — Ambulatory Visit: Payer: BC Managed Care – PPO | Admitting: Family Medicine

## 2020-12-07 ENCOUNTER — Other Ambulatory Visit: Payer: Self-pay

## 2020-12-07 VITALS — BP 112/76 | HR 96 | Temp 98.3°F | Wt 241.4 lb

## 2020-12-07 DIAGNOSIS — J069 Acute upper respiratory infection, unspecified: Secondary | ICD-10-CM | POA: Diagnosis not present

## 2020-12-07 MED ORDER — ALBUTEROL SULFATE HFA 108 (90 BASE) MCG/ACT IN AERS: 2.0000 | INHALATION_SPRAY | RESPIRATORY_TRACT | 1 refills | Status: DC | PRN

## 2020-12-07 NOTE — Progress Notes (Signed)
BP 112/76   Pulse 96   Temp 98.3 F (36.8 C) (Oral)   Wt 241 lb 6.4 oz (109.5 kg)   SpO2 98%   BMI 35.23 kg/m    Subjective:    Patient ID: Casey Cole, male    DOB: [birth year], 45 y.o.   MRN: [id number]  HPI: Casey Cole is a 45 y.o. male  Chief Complaint  Patient presents with   URI    1 week f/up- pt states he feels better overall but is still coughing    Starting to feel better. Actually having some energy. Cough is still there- especially at night when he lays down. Getting stuff up. No fevers. No chest pain. Otherwise feeling well. No other concerns.   Relevant past medical, surgical, family and social history reviewed and updated as indicated. Interim medical history since our last visit reviewed. Allergies and medications reviewed and updated.  Review of Systems  Constitutional: Negative.   HENT: Negative.    Respiratory:  Positive for cough and wheezing. Negative for apnea, choking, chest tightness, shortness of breath and stridor.   Cardiovascular: Negative.   Gastrointestinal: Negative.   Musculoskeletal: Negative.   Psychiatric/Behavioral: Negative.     Per HPI unless specifically indicated above     Objective:    BP 112/76   Pulse 96   Temp 98.3 F (36.8 C) (Oral)   Wt 241 lb 6.4 oz (109.5 kg)   SpO2 98%   BMI 35.23 kg/m   Wt Readings from Last 3 Encounters:  12/07/20 241 lb 6.4 oz (109.5 kg)  11/28/20 240 lb (108.9 kg)  09/29/20 245 lb (111.1 kg)    Physical Exam Vitals and nursing note reviewed.  Constitutional:      General: He is not in acute distress.    Appearance: Normal appearance. He is not ill-appearing, toxic-appearing or diaphoretic.  HENT:     Head: Normocephalic and atraumatic.     Right Ear: External ear normal.     Left Ear: External ear normal.     Nose: Nose normal.     Mouth/Throat:     Mouth: Mucous membranes are moist.     Pharynx: Oropharynx is clear.  Eyes:     General: No scleral icterus.       Right eye: No  discharge.        Left eye: No discharge.     Extraocular Movements: Extraocular movements intact.     Conjunctiva/sclera: Conjunctivae normal.     Pupils: Pupils are equal, round, and reactive to light.  Cardiovascular:     Rate and Rhythm: Normal rate and regular rhythm.     Pulses: Normal pulses.     Heart sounds: Normal heart sounds. No murmur heard.   No friction rub. No gallop.  Pulmonary:     Effort: Pulmonary effort is normal. No respiratory distress.     Breath sounds: No stridor. Wheezing present. No rhonchi or rales.  Chest:     Chest wall: No tenderness.  Musculoskeletal:        General: Normal range of motion.     Cervical back: Normal range of motion and neck supple.  Skin:    General: Skin is warm and dry.     Capillary Refill: Capillary refill takes less than 2 seconds.     Coloration: Skin is not jaundiced or pale.     Findings: No bruising, erythema, lesion or rash.  Neurological:     General: No focal deficit present.  Mental Status: He is alert and oriented to person, place, and time. Mental status is at baseline.  Psychiatric:        Mood and Affect: Mood normal.        Behavior: Behavior normal.        Thought Content: Thought content normal.        Judgment: Judgment normal.    Results for orders placed or performed in visit on 10/04/20  HM DIABETES EYE EXAM  Result Value Ref Range   HM Diabetic Eye Exam No Retinopathy No Retinopathy      Assessment & Plan:   Problem List Items Addressed This Visit   None Visit Diagnoses     Viral upper respiratory tract infection    -  Primary   Resolving. Continues with wheezing- will send albuterol. Call with any concerns.         Follow up plan: Return As scheduled.

## 2021-01-01 ENCOUNTER — Other Ambulatory Visit: Payer: Self-pay

## 2021-01-01 ENCOUNTER — Ambulatory Visit: Payer: BC Managed Care – PPO | Admitting: Family Medicine

## 2021-01-01 ENCOUNTER — Encounter: Payer: Self-pay | Admitting: Family Medicine

## 2021-01-01 VITALS — BP 134/89 | HR 101 | Temp 99.0°F | Ht 69.41 in | Wt 242.8 lb

## 2021-01-01 DIAGNOSIS — E782 Mixed hyperlipidemia: Secondary | ICD-10-CM | POA: Diagnosis not present

## 2021-01-01 DIAGNOSIS — E1129 Type 2 diabetes mellitus with other diabetic kidney complication: Secondary | ICD-10-CM

## 2021-01-01 DIAGNOSIS — R809 Proteinuria, unspecified: Secondary | ICD-10-CM | POA: Diagnosis not present

## 2021-01-01 DIAGNOSIS — R7989 Other specified abnormal findings of blood chemistry: Secondary | ICD-10-CM

## 2021-01-01 LAB — BAYER DCA HB A1C WAIVED: HB A1C (BAYER DCA - WAIVED): 8 % — ABNORMAL HIGH (ref <7.0)

## 2021-01-01 MED ORDER — ALBUTEROL SULFATE HFA 108 (90 BASE) MCG/ACT IN AERS: 2.0000 | INHALATION_SPRAY | RESPIRATORY_TRACT | 1 refills | Status: DC | PRN

## 2021-01-01 MED ORDER — METFORMIN HCL ER (OSM) 1000 MG PO TB24: 1000.0000 mg | ORAL_TABLET | Freq: Two times a day (BID) | ORAL | 1 refills | Status: DC

## 2021-01-01 NOTE — Assessment & Plan Note (Signed)
Triglycerides quite high last visit. Rechecking today.

## 2021-01-01 NOTE — Assessment & Plan Note (Signed)
Not under good control with A1c of 8.0- has been on a bunch of steroids due to URI. Continue current regimen and recheck 3 months. If still running high will increase his ozempic.

## 2021-01-01 NOTE — Progress Notes (Signed)
BP 134/89   Pulse (!) 101   Temp 99 F (37.2 C) (Oral)   Ht 5' 9.41" (1.763 m)   Wt 242 lb 12.8 oz (110.1 kg)   SpO2 97%   BMI 35.43 kg/m    Subjective:    Patient ID: Casey Cole, male    DOB: [birth year], 45 y.o.   MRN: [id number]  HPI: Casey Cole is a 45 y.o. male  Chief Complaint  Patient presents with   Diabetes   DIABETES Hypoglycemic episodes:no Polydipsia/polyuria: no Visual disturbance: no Chest pain: no Paresthesias: no Glucose Monitoring: no  Accucheck frequency: Not Checking Taking Insulin?: no  Long acting insulin:  Short acting insulin: Blood Pressure Monitoring: not checking Retinal Examination: Up to Date Foot Exam: Up to Date Diabetic Education: Completed Pneumovax: Up to Date Influenza: Up to Date Aspirin: no   Relevant past medical, surgical, family and social history reviewed and updated as indicated. Interim medical history since our last visit reviewed. Allergies and medications reviewed and updated.  Review of Systems  Constitutional: Negative.   Respiratory: Negative.    Cardiovascular: Negative.   Gastrointestinal: Negative.   Musculoskeletal: Negative.   Neurological: Negative.   Psychiatric/Behavioral: Negative.     Per HPI unless specifically indicated above     Objective:    BP 134/89   Pulse (!) 101   Temp 99 F (37.2 C) (Oral)   Ht 5' 9.41" (1.763 m)   Wt 242 lb 12.8 oz (110.1 kg)   SpO2 97%   BMI 35.43 kg/m   Wt Readings from Last 3 Encounters:  01/01/21 242 lb 12.8 oz (110.1 kg)  12/07/20 241 lb 6.4 oz (109.5 kg)  11/28/20 240 lb (108.9 kg)    Physical Exam Vitals and nursing note reviewed.  Constitutional:      General: He is not in acute distress.    Appearance: Normal appearance. He is not ill-appearing, toxic-appearing or diaphoretic.  HENT:     Head: Normocephalic and atraumatic.     Right Ear: External ear normal.     Left Ear: External ear normal.     Nose: Nose normal.     Mouth/Throat:      Mouth: Mucous membranes are moist.     Pharynx: Oropharynx is clear.  Eyes:     General: No scleral icterus.       Right eye: No discharge.        Left eye: No discharge.     Extraocular Movements: Extraocular movements intact.     Conjunctiva/sclera: Conjunctivae normal.     Pupils: Pupils are equal, round, and reactive to light.  Cardiovascular:     Rate and Rhythm: Normal rate and regular rhythm.     Pulses: Normal pulses.     Heart sounds: Normal heart sounds. No murmur heard.   No friction rub. No gallop.  Pulmonary:     Effort: Pulmonary effort is normal. No respiratory distress.     Breath sounds: Normal breath sounds. No stridor. No wheezing, rhonchi or rales.  Chest:     Chest wall: No tenderness.  Musculoskeletal:        General: Normal range of motion.     Cervical back: Normal range of motion and neck supple.  Skin:    General: Skin is warm and dry.     Capillary Refill: Capillary refill takes less than 2 seconds.     Coloration: Skin is not jaundiced or pale.     Findings: No bruising, erythema, lesion  or rash.  Neurological:     General: No focal deficit present.     Mental Status: He is alert and oriented to person, place, and time. Mental status is at baseline.  Psychiatric:        Mood and Affect: Mood normal.        Behavior: Behavior normal.        Thought Content: Thought content normal.        Judgment: Judgment normal.    Results for orders placed or performed in visit on 10/04/20  HM DIABETES EYE EXAM  Result Value Ref Range   HM Diabetic Eye Exam No Retinopathy No Retinopathy      Assessment & Plan:   Problem List Items Addressed This Visit       Endocrine   DM (diabetes mellitus), type 2 with renal complications ([place]) - Primary    Not under good control with A1c of 8.0- has been on a bunch of steroids due to URI. Continue current regimen and recheck 3 months. If still running high will increase his ozempic.       Relevant Medications    metformin (FORTAMET) 1000 MG (OSM) 24 hr tablet   Other Relevant Orders   Bayer DCA Hb A1c Waived   Lipid Panel w/o Chol/HDL Ratio   Comprehensive metabolic panel     Other   Hyperlipidemia    Triglycerides quite high last visit. Rechecking today.      Relevant Orders   Lipid Panel w/o Chol/HDL Ratio   Comprehensive metabolic panel   Other Visit Diagnoses     Abnormal thyroid blood test       Rechecking labs today. Await results. Treat as needed.    Relevant Orders   TSH   Lipid Panel w/o Chol/HDL Ratio   Comprehensive metabolic panel        Follow up plan: Return in about 3 months (around 04/03/2021).

## 2021-01-02 ENCOUNTER — Other Ambulatory Visit: Payer: Self-pay | Admitting: Family Medicine

## 2021-01-02 LAB — COMPREHENSIVE METABOLIC PANEL
ALT: 21 IU/L (ref 0–44)
AST: 17 IU/L (ref 0–40)
Albumin/Globulin Ratio: 1.9 (ref 1.2–2.2)
Albumin: 4.4 g/dL (ref 4.0–5.0)
Alkaline Phosphatase: 81 IU/L (ref 44–121)
BUN/Creatinine Ratio: 7 — ABNORMAL LOW (ref 9–20)
BUN: 7 mg/dL (ref 6–24)
Bilirubin Total: 0.9 mg/dL (ref 0.0–1.2)
CO2: 25 mmol/L (ref 20–29)
Calcium: 9.3 mg/dL (ref 8.7–10.2)
Chloride: 98 mmol/L (ref 96–106)
Creatinine, Ser: 1.03 mg/dL (ref 0.76–1.27)
Globulin, Total: 2.3 g/dL (ref 1.5–4.5)
Glucose: 180 mg/dL — ABNORMAL HIGH (ref 65–99)
Potassium: 3.8 mmol/L (ref 3.5–5.2)
Sodium: 139 mmol/L (ref 134–144)
Total Protein: 6.7 g/dL (ref 6.0–8.5)
eGFR: 92 mL/min/{1.73_m2} (ref >59)

## 2021-01-02 LAB — LIPID PANEL W/O CHOL/HDL RATIO
Cholesterol, Total: 208 mg/dL — ABNORMAL HIGH (ref 100–199)
HDL: 33 mg/dL — ABNORMAL LOW (ref >39)
LDL Chol Calc (NIH): 102 mg/dL — ABNORMAL HIGH (ref 0–99)
Triglycerides: 433 mg/dL — ABNORMAL HIGH (ref 0–149)
VLDL Cholesterol Cal: 73 mg/dL — ABNORMAL HIGH (ref 5–40)

## 2021-01-02 LAB — TSH: TSH: 0.801 u[IU]/mL (ref 0.450–4.500)

## 2021-01-02 MED ORDER — ATORVASTATIN CALCIUM 20 MG PO TABS: 20.0000 mg | ORAL_TABLET | ORAL | 3 refills | Status: DC

## 2021-03-26 ENCOUNTER — Other Ambulatory Visit: Payer: Self-pay

## 2021-03-26 ENCOUNTER — Ambulatory Visit: Payer: BC Managed Care – PPO | Admitting: Family Medicine

## 2021-03-26 ENCOUNTER — Encounter: Payer: Self-pay | Admitting: Family Medicine

## 2021-03-26 VITALS — BP 128/89 | HR 91 | Temp 99.0°F | Ht 69.41 in | Wt 239.6 lb

## 2021-03-26 DIAGNOSIS — E782 Mixed hyperlipidemia: Secondary | ICD-10-CM

## 2021-03-26 DIAGNOSIS — R809 Proteinuria, unspecified: Secondary | ICD-10-CM

## 2021-03-26 DIAGNOSIS — I129 Hypertensive chronic kidney disease with stage 1 through stage 4 chronic kidney disease, or unspecified chronic kidney disease: Secondary | ICD-10-CM | POA: Diagnosis not present

## 2021-03-26 DIAGNOSIS — E1129 Type 2 diabetes mellitus with other diabetic kidney complication: Secondary | ICD-10-CM

## 2021-03-26 LAB — BAYER DCA HB A1C WAIVED: HB A1C (BAYER DCA - WAIVED): 7 % — ABNORMAL HIGH (ref 4.8–5.6)

## 2021-03-26 MED ORDER — ATORVASTATIN CALCIUM 20 MG PO TABS: 20.0000 mg | ORAL_TABLET | ORAL | 3 refills | Status: DC

## 2021-03-26 MED ORDER — METFORMIN HCL ER (OSM) 1000 MG PO TB24: 1000.0000 mg | ORAL_TABLET | Freq: Two times a day (BID) | ORAL | 1 refills | Status: DC

## 2021-03-26 MED ORDER — OZEMPIC (1 MG/DOSE) 2 MG/1.5ML ~~LOC~~ SOPN: 1.0000 mg | PEN_INJECTOR | SUBCUTANEOUS | 1 refills | Status: DC

## 2021-03-26 NOTE — Progress Notes (Signed)
BP 128/89   Pulse 91   Temp 99 F (37.2 C) (Oral)   Ht 5' 9.41" (1.763 m)   Wt 239 lb 9.6 oz (108.7 kg)   SpO2 96%   BMI 34.97 kg/m    Subjective:    Patient ID: Casey Cole, male    DOB: February 26, 1976, 44 y.o.   MRN: [id number]  HPI: Casey Cole is a 45 y.o. male  Chief Complaint  Patient presents with   Diabetes   DIABETES Hypoglycemic episodes:no Polydipsia/polyuria: no Visual disturbance: no Chest pain: no Paresthesias: no Glucose Monitoring: no  Accucheck frequency:  very rarely Taking Insulin?: no Blood Pressure Monitoring: not checking Retinal Examination: Up to Date Foot Exam: Up to Date Diabetic Education: Completed Pneumovax: Up to Date Influenza:  Declined Aspirin: no  HYPERLIPIDEMIA Hyperlipidemia status: excellent compliance Satisfied with current treatment?  yes Side effects:  no Medication compliance: excellent compliance Past cholesterol meds: atorvastatin Supplements: none Aspirin:  no The 10-year ASCVD risk score (Arnett DK, et al., 2019) is: 16.8%   Values used to calculate the score:     Age: 70 years     Sex: Male     Is Non-Hispanic African American: No     Diabetic: Yes     Tobacco smoker: Yes     Systolic Blood Pressure: 841 mmHg     Is BP treated: No     HDL Cholesterol: 33 mg/dL     Total Cholesterol: 208 mg/dL Chest pain:  no Coronary artery disease:  no   Relevant past medical, surgical, family and social history reviewed and updated as indicated. Interim medical history since our last visit reviewed. Allergies and medications reviewed and updated.  Review of Systems  Constitutional: Negative.   Respiratory: Negative.    Cardiovascular: Negative.   Gastrointestinal: Negative.   Musculoskeletal: Negative.   Neurological: Negative.   Psychiatric/Behavioral: Negative.     Per HPI unless specifically indicated above     Objective:    BP 128/89   Pulse 91   Temp 99 F (37.2 C) (Oral)   Ht 5' 9.41" (1.763 m)    Wt 239 lb 9.6 oz (108.7 kg)   SpO2 96%   BMI 34.97 kg/m   Wt Readings from Last 3 Encounters:  03/26/21 239 lb 9.6 oz (108.7 kg)  01/01/21 242 lb 12.8 oz (110.1 kg)  12/07/20 241 lb 6.4 oz (109.5 kg)    Physical Exam Vitals and nursing note reviewed.  Constitutional:      General: He is not in acute distress.    Appearance: Normal appearance. He is not ill-appearing, toxic-appearing or diaphoretic.  HENT:     Head: Normocephalic and atraumatic.     Right Ear: External ear normal.     Left Ear: External ear normal.     Nose: Nose normal.     Mouth/Throat:     Mouth: Mucous membranes are moist.     Pharynx: Oropharynx is clear.  Eyes:     General: No scleral icterus.       Right eye: No discharge.        Left eye: No discharge.     Extraocular Movements: Extraocular movements intact.     Conjunctiva/sclera: Conjunctivae normal.     Pupils: Pupils are equal, round, and reactive to light.  Cardiovascular:     Rate and Rhythm: Normal rate and regular rhythm.     Pulses: Normal pulses.     Heart sounds: Normal heart sounds. No murmur heard.  No friction rub. No gallop.  Pulmonary:     Effort: Pulmonary effort is normal. No respiratory distress.     Breath sounds: Normal breath sounds. No stridor. No wheezing, rhonchi or rales.  Chest:     Chest wall: No tenderness.  Musculoskeletal:        General: Normal range of motion.     Cervical back: Normal range of motion and neck supple.  Skin:    General: Skin is warm and dry.     Capillary Refill: Capillary refill takes less than 2 seconds.     Coloration: Skin is not jaundiced or pale.     Findings: No bruising, erythema, lesion or rash.  Neurological:     General: No focal deficit present.     Mental Status: He is alert and oriented to person, place, and time. Mental status is at baseline.  Psychiatric:        Mood and Affect: Mood normal.        Behavior: Behavior normal.        Thought Content: Thought content normal.         Judgment: Judgment normal.    Results for orders placed or performed in visit on 03/26/21  Bayer DCA Hb A1c Waived  Result Value Ref Range   HB A1C (BAYER DCA - WAIVED) 7.0 (H) 4.8 - 5.6 %      Assessment & Plan:   Problem List Items Addressed This Visit       Endocrine   DM (diabetes mellitus), type 2 with renal complications ([place]) - Primary    Doing well with A1c of 7.0. Continue current regimen. Continue to monitor. Call with any concerns. Refills given.        Relevant Medications   Semaglutide, 1 MG/DOSE, (OZEMPIC, 1 MG/DOSE,) 2 MG/1.5ML SOPN   metformin (FORTAMET) 1000 MG (OSM) 24 hr tablet   atorvastatin (LIPITOR) 20 MG tablet   Other Relevant Orders   Comprehensive metabolic panel   CBC with Differential/Platelet   Bayer DCA Hb A1c Waived (Completed)     Genitourinary   Benign hypertensive renal disease    Doing well on no medicine. Continue to monitor. Call with any concerns. Continue to monitor.       Relevant Orders   Comprehensive metabolic panel   CBC with Differential/Platelet     Other   Hyperlipidemia    Under good control on current regimen. Continue current regimen. Continue to monitor. Call with any concerns. Refills given. Labs drawn today.       Relevant Medications   atorvastatin (LIPITOR) 20 MG tablet   Other Relevant Orders   Comprehensive metabolic panel   CBC with Differential/Platelet   Lipid Panel w/o Chol/HDL Ratio     Follow up plan: Return in about 3 months (around 06/26/2021).

## 2021-03-26 NOTE — Assessment & Plan Note (Signed)
Doing well on no medicine. Continue to monitor. Call with any concerns. Continue to monitor.

## 2021-03-26 NOTE — Assessment & Plan Note (Signed)
Under good control on current regimen. Continue current regimen. Continue to monitor. Call with any concerns. Refills given. Labs drawn today.   

## 2021-03-26 NOTE — Assessment & Plan Note (Signed)
Doing well with A1c of 7.0. Continue current regimen. Continue to monitor. Call with any concerns. Refills given.

## 2021-03-27 LAB — CBC WITH DIFFERENTIAL/PLATELET
Basophils Absolute: 0.1 10*3/uL (ref 0.0–0.2)
Basos: 1 %
EOS (ABSOLUTE): 0.2 10*3/uL (ref 0.0–0.4)
Eos: 1 %
Hematocrit: 45.5 % (ref 37.5–51.0)
Hemoglobin: 15.5 g/dL (ref 13.0–17.7)
Immature Grans (Abs): 0 10*3/uL (ref 0.0–0.1)
Immature Granulocytes: 0 %
Lymphocytes Absolute: 3 10*3/uL (ref 0.7–3.1)
Lymphs: 27 %
MCH: 31.4 pg (ref 26.6–33.0)
MCHC: 34.1 g/dL (ref 31.5–35.7)
MCV: 92 fL (ref 79–97)
Monocytes Absolute: 0.7 10*3/uL (ref 0.1–0.9)
Monocytes: 6 %
Neutrophils Absolute: 7.2 10*3/uL — ABNORMAL HIGH (ref 1.4–7.0)
Neutrophils: 65 %
Platelets: 184 10*3/uL (ref 150–450)
RBC: 4.93 x10E6/uL (ref 4.14–5.80)
RDW: 12.5 % (ref 11.6–15.4)
WBC: 11.1 10*3/uL — ABNORMAL HIGH (ref 3.4–10.8)

## 2021-03-27 LAB — LIPID PANEL W/O CHOL/HDL RATIO
Cholesterol, Total: 156 mg/dL (ref 100–199)
HDL: 32 mg/dL — ABNORMAL LOW (ref >39)
LDL Chol Calc (NIH): 84 mg/dL (ref 0–99)
Triglycerides: 241 mg/dL — ABNORMAL HIGH (ref 0–149)
VLDL Cholesterol Cal: 40 mg/dL (ref 5–40)

## 2021-03-27 LAB — COMPREHENSIVE METABOLIC PANEL
ALT: 17 IU/L (ref 0–44)
AST: 14 IU/L (ref 0–40)
Albumin/Globulin Ratio: 2 (ref 1.2–2.2)
Albumin: 4.5 g/dL (ref 4.0–5.0)
Alkaline Phosphatase: 77 IU/L (ref 44–121)
BUN/Creatinine Ratio: 7 — ABNORMAL LOW (ref 9–20)
BUN: 8 mg/dL (ref 6–24)
Bilirubin Total: 1.1 mg/dL (ref 0.0–1.2)
CO2: 25 mmol/L (ref 20–29)
Calcium: 9.7 mg/dL (ref 8.7–10.2)
Chloride: 101 mmol/L (ref 96–106)
Creatinine, Ser: 1.16 mg/dL (ref 0.76–1.27)
Globulin, Total: 2.3 g/dL (ref 1.5–4.5)
Glucose: 116 mg/dL — ABNORMAL HIGH (ref 70–99)
Potassium: 3.7 mmol/L (ref 3.5–5.2)
Sodium: 142 mmol/L (ref 134–144)
Total Protein: 6.8 g/dL (ref 6.0–8.5)
eGFR: 80 mL/min/{1.73_m2} (ref >59)

## 2021-05-14 DIAGNOSIS — L400 Psoriasis vulgaris: Secondary | ICD-10-CM | POA: Diagnosis not present

## 2021-06-04 ENCOUNTER — Ambulatory Visit: Payer: Self-pay | Admitting: *Deleted

## 2021-06-04 ENCOUNTER — Emergency Department
Admission: EM | Admit: 2021-06-04 | Discharge: 2021-06-04 | Disposition: A | Discharge status: Home / Self Care | Payer: BC Managed Care – PPO | Attending: Emergency Medicine | Admitting: Emergency Medicine

## 2021-06-04 ENCOUNTER — Other Ambulatory Visit: Payer: Self-pay

## 2021-06-04 ENCOUNTER — Emergency Department: Payer: BC Managed Care – PPO

## 2021-06-04 DIAGNOSIS — D72829 Elevated white blood cell count, unspecified: Secondary | ICD-10-CM | POA: Insufficient documentation

## 2021-06-04 DIAGNOSIS — E119 Type 2 diabetes mellitus without complications: Secondary | ICD-10-CM | POA: Diagnosis not present

## 2021-06-04 DIAGNOSIS — R079 Chest pain, unspecified: Secondary | ICD-10-CM | POA: Diagnosis not present

## 2021-06-04 DIAGNOSIS — R0602 Shortness of breath: Secondary | ICD-10-CM | POA: Insufficient documentation

## 2021-06-04 LAB — CBC
HCT: 47.5 % (ref 39.0–52.0)
Hemoglobin: 16.3 g/dL (ref 13.0–17.0)
MCH: 31.3 pg (ref 26.0–34.0)
MCHC: 34.3 g/dL (ref 30.0–36.0)
MCV: 91.3 fL (ref 80.0–100.0)
Platelets: 178 10*3/uL (ref 150–400)
RBC: 5.2 MIL/uL (ref 4.22–5.81)
RDW: 11.9 % (ref 11.5–15.5)
WBC: 11 10*3/uL — ABNORMAL HIGH (ref 4.0–10.5)
nRBC: 0 % (ref 0.0–0.2)

## 2021-06-04 LAB — BASIC METABOLIC PANEL
Anion gap: 9 (ref 5–15)
BUN: 9 mg/dL (ref 6–20)
CO2: 29 mmol/L (ref 22–32)
Calcium: 9.7 mg/dL (ref 8.9–10.3)
Chloride: 99 mmol/L (ref 98–111)
Creatinine, Ser: 1.07 mg/dL (ref 0.61–1.24)
GFR, Estimated: >60 mL/min (ref >60)
Glucose, Bld: 180 mg/dL — ABNORMAL HIGH (ref 70–99)
Potassium: 3.9 mmol/L (ref 3.5–5.1)
Sodium: 137 mmol/L (ref 135–145)

## 2021-06-04 LAB — TROPONIN I (HIGH SENSITIVITY): Troponin I (High Sensitivity): <2 ng/L (ref <18)

## 2021-06-04 MED ORDER — ALUM & MAG HYDROXIDE-SIMETH 200-200-20 MG/5ML PO SUSP
30.0000 mL | Freq: Once | ORAL | Status: AC
  Administered 2021-06-04: 30 mL via ORAL
  Filled 2021-06-04: qty 30

## 2021-06-04 MED ORDER — DOXYCYCLINE HYCLATE 100 MG PO TABS: 100.0000 mg | ORAL_TABLET | Freq: Two times a day (BID) | ORAL | 0 refills | Status: AC

## 2021-06-04 MED ORDER — LIDOCAINE VISCOUS HCL 2 % MT SOLN
15.0000 mL | Freq: Once | OROMUCOSAL | Status: AC
  Administered 2021-06-04: 15 mL via ORAL
  Filled 2021-06-04: qty 15

## 2021-06-04 NOTE — Discharge Instructions (Signed)
-  Take antibiotics for the entire course as prescribed. -Recommend trial of daily Prilosec (omeprazole) for 4 weeks and monitor for resolution of symptoms. -Return to the emergency department at any time if you begin to experience any new or worsening symptoms.

## 2021-06-04 NOTE — Telephone Encounter (Signed)
°  Chief Complaint: chest tightness on and off for 2 weeks Symptoms: above Frequency: above Pertinent Negatives: Patient denies sweating, shortness of breath Disposition: [x] ED /[] Urgent Care (no appt availability in office) / [] Appointment(In office/virtual)/ []  [facility]/ [] Home Care/ [] Refused Recommended Disposition /[] [facility]/ []  Follow-up with PCP Additional Notes: Pt was going to drive back to [place] but I referred him to a hospital in [place] since he was at work in [place].  He was agreeable to this plan.

## 2021-06-04 NOTE — Telephone Encounter (Signed)
Reason for Disposition  [1] Chest pain (or "angina") comes and goes AND [2] is happening more often (increasing in frequency) or getting worse (increasing in severity) (Exception: chest pains that last only a few seconds)  Answer Assessment - Initial Assessment Questions 1. LOCATION: "Where does it hurt?"       Pt c/o chest pain.   I woke up in the middle of the night weekend before last.   I'm having tightness in my chest.  I'm having  a little tightness inmy chest more tired.   No history.  Referred to the ED.   2. RADIATION: "Does the pain go anywhere else?" (e.g., into neck, jaw, arms, back)     *No Answer* 3. ONSET: "When did the chest pain begin?" (Minutes, hours or days)      *No Answer* 4. PATTERN "Does the pain come and go, or has it been constant since it started?"  "Does it get worse with exertion?"      *No Answer* 5. DURATION: "How long does it last" (e.g., seconds, minutes, hours)     *No Answer* 6. SEVERITY: "How bad is the pain?"  (e.g., Scale 1-10; mild, moderate, or severe)    - MILD (1-3): doesn't interfere with normal activities     - MODERATE (4-7): interferes with normal activities or awakens from sleep    - SEVERE (8-10): excruciating pain, unable to do any normal activities       *No Answer* 7. CARDIAC RISK FACTORS: "Do you have any history of heart problems or risk factors for heart disease?" (e.g., angina, prior heart attack; diabetes, high blood pressure, high cholesterol, smoker, or strong family history of heart disease)     *No Answer* 8. PULMONARY RISK FACTORS: "Do you have any history of lung disease?"  (e.g., blood clots in lung, asthma, emphysema, birth control pills)     *No Answer* 9. CAUSE: "What do you think is causing the chest pain?"     *No Answer* 10. OTHER SYMPTOMS: "Do you have any other symptoms?" (e.g., dizziness, nausea, vomiting, sweating, fever, difficulty breathing, cough)       *No Answer* 11. PREGNANCY: "Is there any chance you are  pregnant?" "When was your last menstrual period?"       *No Answer*  Protocols used: Chest Pain-A-AH

## 2021-06-04 NOTE — ED Triage Notes (Signed)
Pt states he woke from sleep with chest pain a week ago and since having tightness in his chest with SOB.

## 2021-06-04 NOTE — ED Provider Notes (Signed)
[facility] Provider Note    Event Date/Time   First MD Initiated Contact with Patient 06/04/21 1350     (approximate)   History   Chief Complaint Chest Pain   HPI Casey Cole is a 46 y.o. male, history of diabetes, hyperlipidemia, and OSA, presents the emergency department for evaluation of chest pain.  Patient states that he has been having intermittent central chest pain and shortness of breath throughout the week, particularly at night.  He states that it is a dull and sometimes burning sensation.  He has tried Tums and Pepto-Bismol with limited relief.  Endorses a mild cough/congestion as well.  Denies any radiation to upper extremities or neck.  Denies fever/chills, back pain, numbness/tingling to upper or lower extremities, abdominal pain, or urinary symptoms.  History Limitations: No limitations.      Physical Exam  Triage Vital Signs: ED Triage Vitals  Enc Vitals Group     BP 06/04/21 1216 (!) 168/113     Pulse Rate 06/04/21 1216 96     Resp 06/04/21 1216 18     Temp 06/04/21 1216 98.3 F (36.8 C)     Temp Source 06/04/21 1216 Oral     SpO2 06/04/21 1216 97 %     Weight 06/04/21 1320 239 lb 10.2 oz (108.7 kg)     Height 06/04/21 1320 5\' 9"  (1.753 m)     Head Circumference --      Peak Flow --      Pain Score --      Pain Loc --      Pain Edu? --      Excl. in [place]? --     Most recent vital signs: Vitals:   06/04/21 1216 06/04/21 1441  BP: (!) 168/113 (!) 160/110  Pulse: 96 90  Resp: 18 19  Temp: 98.3 F (36.8 C)   SpO2: 97% 99%    General: Awake, NAD.  CV: Good peripheral perfusion.  S1 and S2 present.  No murmurs rubs or gallops. Resp: Normal effort.  Lung sounds clear bilaterally in apices and bases. Abd: Soft, non-tender. No distention.  Neuro: At baseline.  No gross focal deficits. Other: Not applicable.  Physical Exam    ED Results / Procedures / Treatments  Labs (all labs ordered are listed, but only abnormal  results are displayed) Labs Reviewed  BASIC METABOLIC PANEL - Abnormal; Notable for the following components:      Result Value   Glucose, Bld 180 (*)    All other components within normal limits  CBC - Abnormal; Notable for the following components:   WBC 11.0 (*)    All other components within normal limits  TROPONIN I (HIGH SENSITIVITY)     EKG Sinus rhythm, rate of 98, no ST segment changes, no axis deviations, no AV blocks   RADIOLOGY  ED Provider Interpretation: I personally reviewed and interpreted this image.  No clear focal consolidations.  Subtle density projection over the left posterior fifth rib  DG Chest 2 View  Result Date: 06/04/2021 CLINICAL DATA:  A 46 year old male presents for evaluation of chest pain and shortness of breath with tightness for 1 week. EXAM: CHEST - 2 VIEW COMPARISON:  November 30, 2020. FINDINGS: Cardiomediastinal contours and hilar structures are normal. No lobar consolidation. No sign of pleural effusion. No visible pneumothorax. On limited assessment there is no acute skeletal process. Subtle added density projecting over the LEFT posterior fifth rib which is asymmetric but not overlapping  the anterior rib ir other bony structures on the frontal projection. IMPRESSION: Subtle nodular focus projecting over the LEFT posterior fifth rib on the frontal projection may represent confluence of shadows, post infectious or inflammatory changes or small nodule. If there are respiratory symptoms that suggest infection would suggest 6-12 week follow-up after therapy with PA and lateral chest radiograph or CT. Electronically Signed   By: Zetta Bills M.D.   On: 06/04/2021 12:51    PROCEDURES:  Critical Care performed: None.  Procedures    MEDICATIONS ORDERED IN ED: Medications  alum & mag hydroxide-simeth (MAALOX/MYLANTA) 200-200-20 MG/5ML suspension 30 mL (30 mLs Oral Given 06/04/21 1533)    And  lidocaine (XYLOCAINE) 2 % viscous mouth solution 15 mL (15  mLs Oral Given 06/04/21 1533)     IMPRESSION / MDM / ASSESSMENT AND PLAN / ED COURSE  I reviewed the triage vital signs and the nursing notes.                              Casey Cole is a 46 y.o. male, history of diabetes, hyperlipidemia, and OSA, presents the emergency department for evaluation of chest pain.  Patient states that he has been having intermittent central chest pain and shortness of breath throughout the week, particularly at night.  He states that it is a dull and sometimes burning sensation.  He has tried Tums and Pepto-Bismol with limited relief.  Endorses a mild cough/congestion as well.   Differential diagnosis includes, but is not limited to, ACS, PE, gastritis/GERD, costochondritis, pneumonia, myocarditis/pericarditis, anxiety  ED Course CBC shows mild leukocytosis at 11.  No anemia.  BMP notable for hyperglycemia, consistent with patient's history of diabetes.  Otherwise unremarkable.  Troponin less than 2.  EKG unremarkable.  Unlikely ACS or myocarditis/pericarditis.  Chest x-ray shows subtle nodular finding over the left posterior rib, which may indicate infectious changes or pulmonary nodule.  Assessment/Plan Given the patient's work-up thus far, I do not suspect any serious or life-threatening pathology.  EKG and troponin are reassuring.  Patient is PERC negative.    His presentation is consistent with GERD, though the chest x-ray finding with mild leukocytosis may indicate early onset pneumonia.  We will go ahead and give him a GI cocktail and discharge with a prescription for antibiotics.  Encouraged him to follow-up with his primary care provider for chest x-ray/CT follow-up, as recommended in radiology report  Patient was provided with anticipatory guidance, return precautions, and educational material. Encouraged the patient to return to the emergency department at any time if they begin to experience any new or worsening symptoms.       FINAL CLINICAL  IMPRESSION(S) / ED DIAGNOSES   Final diagnoses:  Chest pain, unspecified type     Rx / DC Orders   ED Discharge Orders          Ordered    doxycycline (VIBRA-TABS) 100 MG tablet  2 times daily        06/04/21 1525             Note:  This document was prepared using Dragon voice recognition software and may include unintentional dictation errors.   Teodoro Spray, [place] 06/05/21 2706    Rada Hay, MD 06/05/21 1043

## 2021-06-08 ENCOUNTER — Ambulatory Visit: Payer: Self-pay | Admitting: *Deleted

## 2021-06-08 NOTE — Telephone Encounter (Signed)
Pt scheduled at 3:20 on Monday

## 2021-06-08 NOTE — Telephone Encounter (Signed)
I have an appt at 3:20 on Monday if no one else has anything sooner

## 2021-06-08 NOTE — Telephone Encounter (Signed)
Reason for Disposition  Patient sounds very sick or weak to the triager  Answer Assessment - Initial Assessment Questions 1. LOCATION: "Where does it hurt?"       Weekend before last I couldn't breath we.. I had tightness in my chest.   Y'all told me to go to the ED.   Went on 06/04/2021 Monday.   My WBC was up.   On antibiotics for pneumonia.   I'm still having the chest pain.   They saw a shadow on my chest x ray and said it was pneumonia.  I'm a smoker   I'm having a little shortness of breath.   It's been better over the last few days.   I've been coughing and more since the antibiotic.   It's not a congested cough.   2. RADIATION: "Does the pain go anywhere else?" (e.g., into neck, jaw, arms, back)     No 3. ONSET: "When did the chest pain begin?" (Minutes, hours or days)      Weekend before last.   No heart attack when in the ED. 4. PATTERN "Does the pain come and go, or has it been constant since it started?"  "Does it get worse with exertion?"      *No Answer* 5. DURATION: "How long does it last" (e.g., seconds, minutes, hours)     *No Answer* 6. SEVERITY: "How bad is the pain?"  (e.g., Scale 1-10; mild, moderate, or severe)    - MILD (1-3): doesn't interfere with normal activities     - MODERATE (4-7): interferes with normal activities or awakens from sleep    - SEVERE (8-10): excruciating pain, unable to do any normal activities       *No Answer* 7. CARDIAC RISK FACTORS: "Do you have any history of heart problems or risk factors for heart disease?" (e.g., angina, prior heart attack; diabetes, high blood pressure, high cholesterol, smoker, or strong family history of heart disease)     *No Answer* 8. PULMONARY RISK FACTORS: "Do you have any history of lung disease?"  (e.g., blood clots in lung, asthma, emphysema, birth control pills)     *No Answer* 9. CAUSE: "What do you think is causing the chest pain?"     *No Answer* 10. OTHER SYMPTOMS: "Do you have any other symptoms?" (e.g.,  dizziness, nausea, vomiting, sweating, fever, difficulty breathing, cough)       *No Answer* 11. PREGNANCY: "Is there any chance you are pregnant?" "When was your last menstrual period?"       *No Answer*  Protocols used: Chest Pain-A-AH  Chief Complaint: continued chest pain/tightness   Seen in ED 06/04/2021 and heart attack was ruled out.   Being treated for possible pneumonia with antibiotic. Symptoms: continued chest pain.   Mild cough non productive Frequency: continues the chest pain since weekend before last. Pertinent Negatives: Patient denies shortness of breath, breaking out in a sweat, radiation of the pain Disposition: [] ED /[] Urgent Care (no appt availability in office) / [] Appointment(In office/virtual)/ []  [facility]/ [] Home Care/ [] Refused Recommended Disposition /[] [facility]/ [x]  Follow-up with PCP Additional Notes: No appts available for a few days.    Pt called while office closed for lunch.   I've sent a high priority note asking them to call pt back for a sooner appt if possible.   Pt agreeable with this plan.   I went over the s/s to go to the ED for.

## 2021-06-11 ENCOUNTER — Other Ambulatory Visit: Payer: Self-pay

## 2021-06-11 ENCOUNTER — Ambulatory Visit: Payer: BC Managed Care – PPO | Admitting: Family Medicine

## 2021-06-11 ENCOUNTER — Ambulatory Visit
Admission: RE | Admit: 2021-06-11 | Discharge: 2021-06-11 | Disposition: A | Discharge status: Home / Self Care | Payer: BC Managed Care – PPO | Source: Ambulatory Visit | Attending: Family Medicine | Admitting: Family Medicine

## 2021-06-11 ENCOUNTER — Ambulatory Visit
Admission: RE | Admit: 2021-06-11 | Discharge: 2021-06-11 | Disposition: A | Discharge status: Home / Self Care | Payer: BC Managed Care – PPO | Attending: Family Medicine | Admitting: Family Medicine

## 2021-06-11 ENCOUNTER — Encounter: Payer: Self-pay | Admitting: Family Medicine

## 2021-06-11 VITALS — BP 120/85 | HR 97 | Temp 98.3°F | Wt 242.2 lb

## 2021-06-11 DIAGNOSIS — R079 Chest pain, unspecified: Secondary | ICD-10-CM | POA: Diagnosis not present

## 2021-06-11 DIAGNOSIS — R809 Proteinuria, unspecified: Secondary | ICD-10-CM

## 2021-06-11 DIAGNOSIS — E1129 Type 2 diabetes mellitus with other diabetic kidney complication: Secondary | ICD-10-CM

## 2021-06-11 LAB — BAYER DCA HB A1C WAIVED: HB A1C (BAYER DCA - WAIVED): 7.6 % — ABNORMAL HIGH (ref 4.8–5.6)

## 2021-06-11 MED ORDER — TRIAMCINOLONE ACETONIDE 40 MG/ML IJ SUSP
40.0000 mg | Freq: Once | INTRAMUSCULAR | Status: AC
  Administered 2021-06-11: 40 mg via INTRAMUSCULAR

## 2021-06-11 MED ORDER — NAPROXEN 500 MG PO TABS: 500.0000 mg | ORAL_TABLET | Freq: Two times a day (BID) | ORAL | 0 refills | Status: DC

## 2021-06-11 NOTE — Assessment & Plan Note (Signed)
Up slightly with A1c of 7.6- missed his last shot. Will restart and recheck 3 months.

## 2021-06-11 NOTE — Progress Notes (Signed)
BP 120/85    Pulse 97    Temp 98.3 F (36.8 C)    Wt 242 lb 3.2 oz (109.9 kg)    SpO2 96%    BMI 35.77 kg/m    Subjective:    Patient ID: Casey Cole, male    DOB: July 16, 1975, 46 y.o.   MRN: [id number]  HPI: Casey Cole is a 46 y.o. male  Chief Complaint  Patient presents with   Diabetes   Chest Pain    Patient states he went to ED on 06/04/21, was diagnosed with pneumonia. Patient states he is still having some chest discomfort.    DIABETES Hypoglycemic episodes:no Polydipsia/polyuria: no Visual disturbance: no Chest pain: yes Paresthesias: no Glucose Monitoring: no  Accucheck frequency: Not Checking Taking Insulin?: no Blood Pressure Monitoring: not checking Retinal Examination: Up to Date Foot Exam: Up to Date Diabetic Education: Completed Pneumovax: Up to Date Influenza: Up to Date Aspirin: yes  CHEST PAIN Time since onset: 2 weeks Onset: sudden Quality: sharp Severity: 5-6 Location: right para substernal Radiation: back Episode duration:  Frequency: intermittent Related to exertion: no Activity when pain started:  Trauma: no Anxiety/recent stressors: yes Aggravating factors:  Alleviating factors:  Status: stable Treatments attempted: antibiotics  Current pain status: in pain Shortness of breath: yes Cough: yes, productive Nausea: no Diaphoresis: no Heartburn: no Palpitations: no   Relevant past medical, surgical, family and social history reviewed and updated as indicated. Interim medical history since our last visit reviewed. Allergies and medications reviewed and updated.  Review of Systems  Constitutional: Negative.   Respiratory: Negative.    Cardiovascular:  Positive for chest pain. Negative for palpitations and leg swelling.  Gastrointestinal: Negative.   Musculoskeletal: Negative.   Neurological: Negative.   Psychiatric/Behavioral: Negative.     Per HPI unless specifically indicated above     Objective:    BP 120/85    Pulse  97    Temp 98.3 F (36.8 C)    Wt 242 lb 3.2 oz (109.9 kg)    SpO2 96%    BMI 35.77 kg/m   Wt Readings from Last 3 Encounters:  06/11/21 242 lb 3.2 oz (109.9 kg)  06/04/21 239 lb 10.2 oz (108.7 kg)  03/26/21 239 lb 9.6 oz (108.7 kg)    Physical Exam Vitals and nursing note reviewed.  Constitutional:      General: He is not in acute distress.    Appearance: Normal appearance. He is not ill-appearing, toxic-appearing or diaphoretic.  HENT:     Head: Normocephalic and atraumatic.     Right Ear: External ear normal.     Left Ear: External ear normal.     Nose: Nose normal.     Mouth/Throat:     Mouth: Mucous membranes are moist.     Pharynx: Oropharynx is clear.  Eyes:     General: No scleral icterus.       Right eye: No discharge.        Left eye: No discharge.     Extraocular Movements: Extraocular movements intact.     Conjunctiva/sclera: Conjunctivae normal.     Pupils: Pupils are equal, round, and reactive to light.  Cardiovascular:     Rate and Rhythm: Normal rate and regular rhythm.     Pulses: Normal pulses.     Heart sounds: Normal heart sounds. No murmur heard.   No friction rub. No gallop.  Pulmonary:     Effort: Pulmonary effort is normal. No respiratory distress.  Breath sounds: Normal breath sounds. No stridor. No wheezing, rhonchi or rales.  Chest:     Chest wall: No tenderness.  Musculoskeletal:        General: Normal range of motion.     Cervical back: Normal range of motion and neck supple.     Left lower leg: Tenderness present.  Skin:    General: Skin is warm and dry.     Capillary Refill: Capillary refill takes less than 2 seconds.     Coloration: Skin is not jaundiced or pale.     Findings: No bruising, erythema, lesion or rash.  Neurological:     General: No focal deficit present.     Mental Status: He is alert and oriented to person, place, and time. Mental status is at baseline.  Psychiatric:        Mood and Affect: Mood normal.         Behavior: Behavior normal.        Thought Content: Thought content normal.        Judgment: Judgment normal.    Results for orders placed or performed in visit on 06/11/21  Bayer DCA Hb A1c Waived  Result Value Ref Range   HB A1C (BAYER DCA - WAIVED) 7.6 (H) 4.8 - 5.6 %      Assessment & Plan:   Problem List Items Addressed This Visit       Endocrine   DM (diabetes mellitus), type 2 with renal complications ([place]) - Primary    Up slightly with A1c of 7.6- missed his last shot. Will restart and recheck 3 months.       Relevant Orders   Bayer DCA Hb A1c Waived (Completed)   Other Visit Diagnoses     Right-sided chest pain       Concern for pleurisy/costocondritis/worsening pneumonia. Will check x-ray and treat with kenalong/naproxen. Recheck 1 week. Will change abx if needed.   Relevant Medications   triamcinolone acetonide (KENALOG-40) injection 40 mg   Other Relevant Orders   DG Chest 2 View        Follow up plan: Return in about 1 week (around 06/18/2021).

## 2021-06-12 ENCOUNTER — Encounter: Payer: Self-pay | Admitting: Family Medicine

## 2021-06-18 ENCOUNTER — Other Ambulatory Visit: Payer: Self-pay

## 2021-06-18 ENCOUNTER — Ambulatory Visit: Payer: BC Managed Care – PPO | Admitting: Family Medicine

## 2021-06-18 ENCOUNTER — Encounter: Payer: Self-pay | Admitting: Family Medicine

## 2021-06-18 VITALS — BP 120/79 | HR 98 | Temp 98.4°F | Wt 241.6 lb

## 2021-06-18 DIAGNOSIS — R079 Chest pain, unspecified: Secondary | ICD-10-CM

## 2021-06-18 NOTE — Progress Notes (Signed)
BP 120/79    Pulse 98    Temp 98.4 F (36.9 C)    Wt 241 lb 9.6 oz (109.6 kg)    SpO2 96%    BMI 35.68 kg/m    Subjective:    Patient ID: Casey Cole, male    DOB: 1975-11-02, 46 y.o.   MRN: [id number]  HPI: Dominiq Fontaine is a 46 y.o. male  Chief Complaint  Patient presents with   Chest Pain    Patient is here for one week follow up, patient states he is feeling better    Resolved after shot last week. Feeling 100% better. No concerns.   Relevant past medical, surgical, family and social history reviewed and updated as indicated. Interim medical history since our last visit reviewed. Allergies and medications reviewed and updated.  Review of Systems  Constitutional: Negative.   Respiratory: Negative.    Cardiovascular: Negative.   Psychiatric/Behavioral: Negative.     Per HPI unless specifically indicated above     Objective:    BP 120/79    Pulse 98    Temp 98.4 F (36.9 C)    Wt 241 lb 9.6 oz (109.6 kg)    SpO2 96%    BMI 35.68 kg/m   Wt Readings from Last 3 Encounters:  06/18/21 241 lb 9.6 oz (109.6 kg)  06/11/21 242 lb 3.2 oz (109.9 kg)  06/04/21 239 lb 10.2 oz (108.7 kg)    Physical Exam Vitals and nursing note reviewed.  Constitutional:      General: He is not in acute distress.    Appearance: Normal appearance. He is not ill-appearing, toxic-appearing or diaphoretic.  HENT:     Head: Normocephalic and atraumatic.     Right Ear: External ear normal.     Left Ear: External ear normal.     Nose: Nose normal.     Mouth/Throat:     Mouth: Mucous membranes are moist.     Pharynx: Oropharynx is clear.  Eyes:     General: No scleral icterus.       Right eye: No discharge.        Left eye: No discharge.     Extraocular Movements: Extraocular movements intact.     Conjunctiva/sclera: Conjunctivae normal.     Pupils: Pupils are equal, round, and reactive to light.  Cardiovascular:     Rate and Rhythm: Normal rate and regular rhythm.     Pulses: Normal  pulses.     Heart sounds: Normal heart sounds. No murmur heard.   No friction rub. No gallop.  Pulmonary:     Effort: Pulmonary effort is normal. No respiratory distress.     Breath sounds: Normal breath sounds. No stridor. No wheezing, rhonchi or rales.  Chest:     Chest wall: No tenderness.  Musculoskeletal:        General: Normal range of motion.     Cervical back: Normal range of motion and neck supple.  Skin:    General: Skin is warm and dry.     Capillary Refill: Capillary refill takes less than 2 seconds.     Coloration: Skin is not jaundiced or pale.     Findings: No bruising, erythema, lesion or rash.  Neurological:     General: No focal deficit present.     Mental Status: He is alert and oriented to person, place, and time. Mental status is at baseline.  Psychiatric:        Mood and Affect: Mood normal.  Behavior: Behavior normal.        Thought Content: Thought content normal.        Judgment: Judgment normal.    Results for orders placed or performed in visit on 06/11/21  Bayer DCA Hb A1c Waived  Result Value Ref Range   HB A1C (BAYER DCA - WAIVED) 7.6 (H) 4.8 - 5.6 %      Assessment & Plan:   Problem List Items Addressed This Visit   None Visit Diagnoses     Right-sided chest pain    -  Primary   Resolved. Call if it starts again.        Follow up plan: Return if symptoms worsen or fail to improve.

## 2021-06-21 ENCOUNTER — Emergency Department: Payer: BC Managed Care – PPO

## 2021-06-21 ENCOUNTER — Emergency Department
Admission: EM | Admit: 2021-06-21 | Discharge: 2021-06-21 | Disposition: A | Discharge status: Home / Self Care | Payer: BC Managed Care – PPO | Attending: Emergency Medicine | Admitting: Emergency Medicine

## 2021-06-21 ENCOUNTER — Other Ambulatory Visit: Payer: Self-pay

## 2021-06-21 ENCOUNTER — Telehealth: Payer: Self-pay | Admitting: *Deleted

## 2021-06-21 ENCOUNTER — Encounter: Payer: Self-pay | Admitting: Emergency Medicine

## 2021-06-21 DIAGNOSIS — F17200 Nicotine dependence, unspecified, uncomplicated: Secondary | ICD-10-CM | POA: Diagnosis not present

## 2021-06-21 DIAGNOSIS — R0789 Other chest pain: Secondary | ICD-10-CM | POA: Diagnosis not present

## 2021-06-21 DIAGNOSIS — G4730 Sleep apnea, unspecified: Secondary | ICD-10-CM | POA: Diagnosis not present

## 2021-06-21 DIAGNOSIS — R002 Palpitations: Secondary | ICD-10-CM | POA: Diagnosis not present

## 2021-06-21 DIAGNOSIS — R0600 Dyspnea, unspecified: Secondary | ICD-10-CM | POA: Diagnosis not present

## 2021-06-21 DIAGNOSIS — G4733 Obstructive sleep apnea (adult) (pediatric): Secondary | ICD-10-CM | POA: Diagnosis not present

## 2021-06-21 DIAGNOSIS — D72829 Elevated white blood cell count, unspecified: Secondary | ICD-10-CM | POA: Insufficient documentation

## 2021-06-21 DIAGNOSIS — R739 Hyperglycemia, unspecified: Secondary | ICD-10-CM | POA: Insufficient documentation

## 2021-06-21 DIAGNOSIS — R079 Chest pain, unspecified: Secondary | ICD-10-CM | POA: Diagnosis not present

## 2021-06-21 LAB — COMPREHENSIVE METABOLIC PANEL
ALT: 17 U/L (ref 0–44)
AST: 13 U/L — ABNORMAL LOW (ref 15–41)
Albumin: 4.2 g/dL (ref 3.5–5.0)
Alkaline Phosphatase: 68 U/L (ref 38–126)
Anion gap: 7 (ref 5–15)
BUN: 11 mg/dL (ref 6–20)
CO2: 26 mmol/L (ref 22–32)
Calcium: 9.2 mg/dL (ref 8.9–10.3)
Chloride: 103 mmol/L (ref 98–111)
Creatinine, Ser: 0.95 mg/dL (ref 0.61–1.24)
GFR, Estimated: >60 mL/min (ref >60)
Glucose, Bld: 228 mg/dL — ABNORMAL HIGH (ref 70–99)
Potassium: 3.8 mmol/L (ref 3.5–5.1)
Sodium: 136 mmol/L (ref 135–145)
Total Bilirubin: 1.4 mg/dL — ABNORMAL HIGH (ref 0.3–1.2)
Total Protein: 7.2 g/dL (ref 6.5–8.1)

## 2021-06-21 LAB — CBC WITH DIFFERENTIAL/PLATELET
Abs Immature Granulocytes: 0.07 10*3/uL (ref 0.00–0.07)
Basophils Absolute: 0.1 10*3/uL (ref 0.0–0.1)
Basophils Relative: 1 %
Eosinophils Absolute: 0.2 10*3/uL (ref 0.0–0.5)
Eosinophils Relative: 2 %
HCT: 47.1 % (ref 39.0–52.0)
Hemoglobin: 16.1 g/dL (ref 13.0–17.0)
Immature Granulocytes: 1 %
Lymphocytes Relative: 20 %
Lymphs Abs: 2.6 10*3/uL (ref 0.7–4.0)
MCH: 30.8 pg (ref 26.0–34.0)
MCHC: 34.2 g/dL (ref 30.0–36.0)
MCV: 90.1 fL (ref 80.0–100.0)
Monocytes Absolute: 0.8 10*3/uL (ref 0.1–1.0)
Monocytes Relative: 6 %
Neutro Abs: 9.7 10*3/uL — ABNORMAL HIGH (ref 1.7–7.7)
Neutrophils Relative %: 70 %
Platelets: 213 10*3/uL (ref 150–400)
RBC: 5.23 MIL/uL (ref 4.22–5.81)
RDW: 12.1 % (ref 11.5–15.5)
WBC: 13.4 10*3/uL — ABNORMAL HIGH (ref 4.0–10.5)
nRBC: 0 % (ref 0.0–0.2)

## 2021-06-21 LAB — TROPONIN I (HIGH SENSITIVITY)
Troponin I (High Sensitivity): 5 ng/L (ref <18)
Troponin I (High Sensitivity): 5 ng/L (ref <18)

## 2021-06-21 LAB — D-DIMER, QUANTITATIVE: D-Dimer, Quant: <0.27 ug/mL-FEU (ref 0.00–0.50)

## 2021-06-21 NOTE — Discharge Instructions (Signed)
Please try to wear your CPAP mask.  I would reach out to your PCP to discuss getting a different sized/fitting of a mask.  If you develop any further worsening chest pain, particularly chest pain with passing out, fever then please return to the ED

## 2021-06-21 NOTE — ED Provider Notes (Signed)
[facility] Provider Note    Event Date/Time   First MD Initiated Contact with Patient 06/21/21 [phone]     (approximate)   History   Chest Pain   HPI  Casey Cole is a 46 y.o. male who presents to the ED for evaluation of Chest Pain   Reviewed PCP visit from 2/13 patient was following up for right-sided chest pain.  He was also seen on 2/6 by his PCP for subacute right-sided chest pain.  Patient self-reports a history of OSA, but does not wear his CPAP.  Further reports 1 PPD smoking.   Patient presents to the ED for evaluation of chest pain, palpitations and dyspnea.  Reports this is happened multiple times over the past few weeks and only ever happens at night.  Reports he will awaken from sleep with palpitations, central chest tightness and tachypnea/dyspnea, resolving after couple minutes.  Denies any exertional chest pain, decreased exercise tolerance or dyspnea on exertion.  Reports the pain lasts a matter of minutes before self resolving.   Physical Exam   Triage Vital Signs: ED Triage Vitals  Enc Vitals Group     BP 06/21/21 0354 (!) 140/94     Pulse Rate 06/21/21 0354 90     Resp 06/21/21 0354 (!) 22     Temp 06/21/21 0354 97.7 F (36.5 C)     Temp src --      SpO2 06/21/21 0354 96 %     Weight 06/21/21 0352 240 lb (108.9 kg)     Height 06/21/21 0352 5\' 9"  (1.753 m)     Head Circumference --      Peak Flow --      Pain Score 06/21/21 0352 6     Pain Loc --      Pain Edu? --      Excl. in [place]? --     Most recent vital signs: Vitals:   06/21/21 0354  BP: (!) 140/94  Pulse: 90  Resp: (!) 22  Temp: 97.7 F (36.5 C)  SpO2: 96%   General: Awake, no distress.  CV:  Good peripheral perfusion. RRR Resp:  Normal effort. CTAB Abd:  No distention. Soft and benign MSK:  No deformity noted.  Neuro:  No focal deficits appreciated. Other:    ED Results / Procedures / Treatments   Labs (all labs ordered are listed, but only abnormal  results are displayed) Labs Reviewed  CBC WITH DIFFERENTIAL/PLATELET - Abnormal; Notable for the following components:      Result Value   WBC 13.4 (*)    Neutro Abs 9.7 (*)    All other components within normal limits  COMPREHENSIVE METABOLIC PANEL - Abnormal; Notable for the following components:   Glucose, Bld 228 (*)    AST 13 (*)    Total Bilirubin 1.4 (*)    All other components within normal limits  D-DIMER, QUANTITATIVE  TROPONIN I (HIGH SENSITIVITY)  TROPONIN I (HIGH SENSITIVITY)    EKG Sinus rhythm, rate of 92 bpm.  Normal axis and intervals.  No evidence of acute ischemia.  RADIOLOGY CXR reviewed by me without evidence of acute cardiopulmonary pathology.  Official radiology report(s): DG Chest 2 View  Result Date: 06/21/2021 CLINICAL DATA:  46 year old male with chest pain and tightness upon waking at 0130 hours. Smoker. EXAM: CHEST - 2 VIEW COMPARISON:  Chest radiographs 06/11/2021 and earlier. FINDINGS: PA and lateral views at 0356 hours. Lung volumes and mediastinal contours remain normal. Visualized tracheal  air column is within normal limits. Stable chronic increased pulmonary interstitial markings. Otherwise both lungs remain clear. No pneumothorax or pleural effusion. No osseous abnormality identified. Negative visible bowel gas. IMPRESSION: No acute cardiopulmonary abnormality. Chronic likely smoking related increased pulmonary interstitium. Electronically Signed   By: Genevie Ann M.D.   On: 06/21/2021 04:12    PROCEDURES and INTERVENTIONS:  Procedures  Medications - No data to display   IMPRESSION / MDM / [place] / ED COURSE  I reviewed the triage vital signs and the nursing notes.  Patient presents to the ED for evaluation of chest discomfort, dyspnea and palpitations, possibly related to his OSA and ultimately suitable for outpatient management.  He looks clinically well, without evidence of neurologic vascular deficits.  No signs of trauma.   Work-up is reassuring without evidence of ACS, nonischemic EKG and 2 negative troponins.  D-dimer is negative and I see no indication to CTA his chest.  CXR is clear.  Metabolic panel with mild hyperglycemia without acidosis and CBC with mild and seemingly chronic leukocytosis.  No recurrence of chest pain in the ED.  No barriers to outpatient management.  We discussed the importance of adherence to his CPAP and following up with his PCP to talk about getting a different mask that he prefers.  Return precautions for the ED discussed.  Clinical Course as of 06/21/21 9449  Thu Jun 21, 2021  0440 Discussed plan of care with patient and the wife.  We discussed D-dimer screening for acute PE. [DS]  6759 Reassessed.  Feeling well.  [DS]    Clinical Course User Index [DS] Vladimir Crofts, MD     FINAL CLINICAL IMPRESSION(S) / ED DIAGNOSES   Final diagnoses:  Other chest pain  OSA (obstructive sleep apnea)     Rx / DC Orders   ED Discharge Orders     None        Note:  This document was prepared using Dragon voice recognition software and may include unintentional dictation errors.   Vladimir Crofts, MD 06/21/21 [phone]

## 2021-06-21 NOTE — ED Notes (Signed)
Lab notified new blue top sent with save label.

## 2021-06-21 NOTE — ED Notes (Signed)
Pt given cup of water and warm blanket ? ?

## 2021-06-21 NOTE — Telephone Encounter (Signed)
Transition Care Management Follow-up Telephone Call Date of discharge and from where: [place] How have you been since you were released from the hospital? Feeling tired Any questions or concerns? No  Items Reviewed: Did the pt receive and understand the discharge instructions provided? Yes  Medications obtained and verified? No  Other? No  Any new allergies since your discharge? No  Dietary orders reviewed? No Do you have support at home? Yes   Home Care and Equipment/Supplies: Were home health services ordered? not applicable If so, what is the name of the agency?   Has the agency set up a time to come to the patient's home? no Were any new equipment or medical supplies ordered?  No What is the name of the medical supply agency?  Were you able to get the supplies/equipment? not applicable Do you have any questions related to the use of the equipment or supplies? No  Functional Questionnaire: (I = Independent and D = Dependent) ADLs: I  Bathing/Dressing- I  Meal Prep- I  Eating- I  Maintaining continence- I  Transferring/Ambulation- I  Managing Meds- I  Follow up appointments reviewed:  PCP Hospital f/u appt confirmed? Yes  Scheduled to see Wynetta Emery on 06-22-2021 @ 3:20. [facility] f/u appt confirmed? No  Scheduled to see  on  . Are transportation arrangements needed? No  If their condition worsens, is the pt aware to call PCP or go to the Emergency Dept.? Yes Was the patient provided with contact information for the PCP's office or ED? Yes Was to pt encouraged to call back with questions or concerns? Yes

## 2021-06-21 NOTE — ED Triage Notes (Signed)
Patient ambulatory to triage with steady gait, without difficulty or distress noted; pt reports chest tightness upon awakening at 130am accomp by [facility]

## 2021-06-21 NOTE — ED Notes (Signed)
Blue top sent down with regular labs

## 2021-06-22 ENCOUNTER — Ambulatory Visit: Payer: BC Managed Care – PPO | Admitting: Family Medicine

## 2021-06-22 ENCOUNTER — Encounter: Payer: Self-pay | Admitting: Family Medicine

## 2021-06-22 ENCOUNTER — Inpatient Hospital Stay: Payer: BC Managed Care – PPO | Admitting: Family Medicine

## 2021-06-22 VITALS — BP 112/81 | HR 94 | Temp 98.2°F | Wt 232.8 lb

## 2021-06-22 DIAGNOSIS — R079 Chest pain, unspecified: Secondary | ICD-10-CM | POA: Insufficient documentation

## 2021-06-22 DIAGNOSIS — G4733 Obstructive sleep apnea (adult) (pediatric): Secondary | ICD-10-CM | POA: Diagnosis not present

## 2021-06-22 MED ORDER — KETOROLAC TROMETHAMINE 60 MG/2ML IM SOLN
60.0000 mg | Freq: Once | INTRAMUSCULAR | Status: AC
  Administered 2021-06-22: 60 mg via INTRAMUSCULAR

## 2021-06-22 MED ORDER — CYCLOBENZAPRINE HCL 10 MG PO TABS: 10.0000 mg | ORAL_TABLET | Freq: Every day | ORAL | 0 refills | Status: DC

## 2021-06-22 NOTE — Assessment & Plan Note (Signed)
Has been off CPAP for years as unable to tolerate it. Waking up with CP. Will get him into sleep medicine for evaluation regarding different CPAP mask vs implantable device.

## 2021-06-22 NOTE — Assessment & Plan Note (Signed)
Has been to the ER 2x, and has been very anxious. Work up in the ER has been normal. Likely combination of musculoskeletal, untreated OSA and anxiety, but given his risk factors and increasing nervousness about this- will get him into see cardiology. Referral generated today.

## 2021-06-22 NOTE — Progress Notes (Signed)
BP 112/81    Pulse 94    Temp 98.2 F (36.8 C)    Wt 232 lb 12.8 oz (105.6 kg)    SpO2 98%    BMI 34.38 kg/m    Subjective:    Patient ID: Casey Cole, male    DOB: 06-26-1975, 46 y.o.   MRN: [id number]  HPI: Casey Cole is a 46 y.o. male  Chief Complaint  Patient presents with   Chest Pain    Patient was woken up from his sleep Wednesday night with chest tightness, and was shaking. Went to ER and had imaging and EKG.    CHEST PAIN Duration:past couple of weeks, coming and going Onset: sudden Quality: aching and tight Severity: severe Location: right para substernal Radiation: none Episode duration: couple of hours Frequency: intermittent Related to exertion: no Activity when pain started:  Trauma: no Anxiety/recent stressors: yes Status: fluctuating Treatments attempted: ibuprofen  Current pain status: pain free Shortness of breath: yes Cough: no Nausea: no Diaphoresis: no Heartburn: no Palpitations: no  SLEEP APNEA Sleep apnea status: uncontrolled Duration: chronic Satisfied with current treatment?:  no CPAP use:  no Sleep quality with CPAP use: not using Treament compliance:poor compliance Last sleep study: few years Wakes feeling refreshed:  no Daytime hypersomnolence:  yes Fatigue:  yes Insomnia:  no Good sleep hygiene:  no Difficulty falling asleep:  no Difficulty staying asleep:  yes Snoring bothers bed partner:  no Observed apnea by bed partner: yes Obesity:  yes Hypertension: yes  Pulmonary hypertension:  no Coronary artery disease:  no  Relevant past medical, surgical, family and social history reviewed and updated as indicated. Interim medical history since our last visit reviewed. Allergies and medications reviewed and updated.  Review of Systems  Constitutional: Negative.   HENT: Negative.    Respiratory:  Positive for shortness of breath. Negative for apnea, cough, choking, chest tightness, wheezing and stridor.   Cardiovascular:   Positive for chest pain and palpitations. Negative for leg swelling.  Gastrointestinal: Negative.   Musculoskeletal: Negative.   Psychiatric/Behavioral: Negative.     Per HPI unless specifically indicated above     Objective:    BP 112/81    Pulse 94    Temp 98.2 F (36.8 C)    Wt 232 lb 12.8 oz (105.6 kg)    SpO2 98%    BMI 34.38 kg/m   Wt Readings from Last 3 Encounters:  06/22/21 232 lb 12.8 oz (105.6 kg)  06/21/21 240 lb (108.9 kg)  06/18/21 241 lb 9.6 oz (109.6 kg)    Physical Exam Vitals and nursing note reviewed.  Constitutional:      General: He is not in acute distress.    Appearance: Normal appearance. He is not ill-appearing, toxic-appearing or diaphoretic.  HENT:     Head: Normocephalic and atraumatic.     Right Ear: External ear normal.     Left Ear: External ear normal.     Nose: Nose normal.     Mouth/Throat:     Mouth: Mucous membranes are moist.     Pharynx: Oropharynx is clear.  Eyes:     General: No scleral icterus.       Right eye: No discharge.        Left eye: No discharge.     Extraocular Movements: Extraocular movements intact.     Conjunctiva/sclera: Conjunctivae normal.     Pupils: Pupils are equal, round, and reactive to light.  Cardiovascular:     Rate and  Rhythm: Normal rate and regular rhythm.     Pulses: Normal pulses.     Heart sounds: Normal heart sounds. No murmur heard.   No friction rub. No gallop.  Pulmonary:     Effort: Pulmonary effort is normal. No respiratory distress.     Breath sounds: Normal breath sounds. No stridor. No wheezing, rhonchi or rales.  Chest:     Chest wall: Tenderness present.  Musculoskeletal:        General: Normal range of motion.     Cervical back: Normal range of motion and neck supple.  Skin:    General: Skin is warm and dry.     Capillary Refill: Capillary refill takes less than 2 seconds.     Coloration: Skin is not jaundiced or pale.     Findings: No bruising, erythema, lesion or rash.   Neurological:     General: No focal deficit present.     Mental Status: He is alert and oriented to person, place, and time. Mental status is at baseline.  Psychiatric:        Mood and Affect: Mood normal.        Behavior: Behavior normal.        Thought Content: Thought content normal.        Judgment: Judgment normal.    Results for orders placed or performed during the hospital encounter of 06/21/21  CBC with Differential  Result Value Ref Range   WBC 13.4 (H) 4.0 - 10.5 K/uL   RBC 5.23 4.22 - 5.81 MIL/uL   Hemoglobin 16.1 13.0 - 17.0 g/dL   HCT 47.1 39.0 - 52.0 %   MCV 90.1 80.0 - 100.0 fL   MCH 30.8 26.0 - 34.0 pg   MCHC 34.2 30.0 - 36.0 g/dL   RDW 12.1 11.5 - 15.5 %   Platelets 213 150 - 400 K/uL   nRBC 0.0 0.0 - 0.2 %   Neutrophils Relative % 70 %   Neutro Abs 9.7 (H) 1.7 - 7.7 K/uL   Lymphocytes Relative 20 %   Lymphs Abs 2.6 0.7 - 4.0 K/uL   Monocytes Relative 6 %   Monocytes Absolute 0.8 0.1 - 1.0 K/uL   Eosinophils Relative 2 %   Eosinophils Absolute 0.2 0.0 - 0.5 K/uL   Basophils Relative 1 %   Basophils Absolute 0.1 0.0 - 0.1 K/uL   Immature Granulocytes 1 %   Abs Immature Granulocytes 0.07 0.00 - 0.07 K/uL  Comprehensive metabolic panel  Result Value Ref Range   Sodium 136 135 - 145 mmol/L   Potassium 3.8 3.5 - 5.1 mmol/L   Chloride 103 98 - 111 mmol/L   CO2 26 22 - 32 mmol/L   Glucose, Bld 228 (H) 70 - 99 mg/dL   BUN 11 6 - 20 mg/dL   Creatinine, Ser 0.95 0.61 - 1.24 mg/dL   Calcium 9.2 8.9 - 10.3 mg/dL   Total Protein 7.2 6.5 - 8.1 g/dL   Albumin 4.2 3.5 - 5.0 g/dL   AST 13 (L) 15 - 41 U/L   ALT 17 0 - 44 U/L   Alkaline Phosphatase 68 38 - 126 U/L   Total Bilirubin 1.4 (H) 0.3 - 1.2 mg/dL   GFR, Estimated >60 >60 mL/min   Anion gap 7 5 - 15  D-dimer, quantitative  Result Value Ref Range   D-Dimer, Quant <0.27 0.00 - 0.50 ug/mL-FEU  Troponin I (High Sensitivity)  Result Value Ref Range   Troponin I (High Sensitivity) 5 <  18 ng/L  Troponin I  (High Sensitivity)  Result Value Ref Range   Troponin I (High Sensitivity) 5 <18 ng/L      Assessment & Plan:   Problem List Items Addressed This Visit       Respiratory   OSA (obstructive sleep apnea)    Has been off CPAP for years as unable to tolerate it. Waking up with CP. Will get him into sleep medicine for evaluation regarding different CPAP mask vs implantable device.       Relevant Orders   Ambulatory referral to Sleep Studies     Other   Right-sided chest pain - Primary    Has been to the ER 2x, and has been very anxious. Work up in the ER has been normal. Likely combination of musculoskeletal, untreated OSA and anxiety, but given his risk factors and increasing nervousness about this- will get him into see cardiology. Referral generated today.       Relevant Orders   Ambulatory referral to Cardiology     Follow up plan: Return in about 4 weeks (around 07/20/2021).

## 2021-06-26 ENCOUNTER — Ambulatory Visit: Payer: BC Managed Care – PPO | Admitting: Family Medicine

## 2021-07-03 ENCOUNTER — Encounter: Payer: Self-pay | Admitting: Family Medicine

## 2021-07-09 ENCOUNTER — Other Ambulatory Visit: Payer: Self-pay

## 2021-07-09 ENCOUNTER — Ambulatory Visit: Payer: BC Managed Care – PPO | Admitting: Cardiology

## 2021-07-09 ENCOUNTER — Encounter: Payer: Self-pay | Admitting: Cardiology

## 2021-07-09 VITALS — BP 140/92 | HR 94 | Ht 68.0 in | Wt 239.0 lb

## 2021-07-09 DIAGNOSIS — F17200 Nicotine dependence, unspecified, uncomplicated: Secondary | ICD-10-CM

## 2021-07-09 DIAGNOSIS — E782 Mixed hyperlipidemia: Secondary | ICD-10-CM

## 2021-07-09 DIAGNOSIS — R072 Precordial pain: Secondary | ICD-10-CM

## 2021-07-09 MED ORDER — IVABRADINE HCL 7.5 MG PO TABS
15.0000 mg | ORAL_TABLET | Freq: Once | ORAL | 0 refills | Status: AC
Start: 2021-07-09 — End: 2021-07-09

## 2021-07-09 MED ORDER — METOPROLOL TARTRATE 100 MG PO TABS: 100.0000 mg | ORAL_TABLET | Freq: Once | ORAL | 0 refills | Status: DC

## 2021-07-09 NOTE — Patient Instructions (Signed)
Medication Instructions:  ? ?Your physician recommends that you continue on your current medications as directed. Please refer to the Current Medication list given to you today. ? ?*If you need a refill on your cardiac medications before your next appointment, please call your pharmacy* ? ? ?Lab Work: ?None ordered ?If you have labs (blood work) drawn today and your tests are completely normal, you will receive your results only by: ?MyChart Message (if you have MyChart) OR ?A paper copy in the mail ?If you have any lab test that is abnormal or we need to change your treatment, we will call you to review the results. ? ? ?Testing/Procedures: ? ?Your physician has requested that you have an echocardiogram. Echocardiography is a painless test that uses sound waves to create images of your heart. It provides your doctor with information about the size and shape of your heart and how well your heart?s chambers and valves are working. This procedure takes approximately one hour. There are no restrictions for this procedure. ? ? ?Your physician has requested that you have cardiac CT. Cardiac computed tomography (CT) is a painless test that uses an x-ray machine to take clear, detailed pictures of your heart.  ? ?Your cardiac CT will be scheduled at: ? ?[address] ?[place], [place] [address] ?([phone] ? ?Thursday 07/12/21 at 2:00 PM ? ?Please arrive 15 mins early for check-in and test prep. ? ? ? ?Please follow these instructions carefully (unless otherwise directed): ? ? ? ?On the Night Before the Test: ?Be sure to Drink plenty of water. ?Do not consume any caffeinated/decaffeinated beverages or chocolate 12 hours prior to your test. ? ? ?On the Day of the Test: ?Drink plenty of water until 1 hour prior to the test. ?Do not eat any food 4 hours prior to the test. ?You may take your regular medications prior to the test.  ?Take metoprolol (Lopressor) 100 MG  two hours prior to test. ?Take Ivabradine (Corlanor) 15 MG two hours prior to test. ? ? ?     ?After the Test: ?Drink plenty of water. ?After receiving IV contrast, you may experience a mild flushed feeling. This is normal. ?On occasion, you may experience a mild rash up to 24 hours after the test. This is not dangerous. If this occurs, you can take Benadryl 25 mg and increase your fluid intake. ?If you experience trouble breathing, this can be serious. If it is severe call 911 IMMEDIATELY. If it is mild, please call our office. ?If you take any of these medications: Glipizide/Metformin, Avandament, Glucavance, please do not take 48 hours after completing test unless otherwise instructed. ? ?Please allow 2-4 weeks for scheduling of routine cardiac CTs. Some insurance companies require a pre-authorization which may delay scheduling of this test.  ? ?For non-scheduling related questions, please contact the cardiac imaging nurse navigator should you have any questions/concerns: ?Marchia Bond, Cardiac Imaging Nurse Navigator ?Gordy Clement, Cardiac Imaging Nurse Navigator ?[facility] ?Direct Office Dial: [phone]  ? ?For scheduling needs, including cancellations and rescheduling, please call [place], [phone].  ? ? ? ? ?Follow-Up: ?At [facility], you and your health needs are our priority.  As part of our continuing mission to provide you with exceptional heart care, we have created designated Provider Care Teams.  These Care Teams include your primary Cardiologist (physician) and Advanced Practice Providers (APPs -  Physician Assistants and Nurse Practitioners) who all work together to provide  you with the care you need, when you need it. ? ?We recommend signing up for the patient portal called "MyChart".  Sign up information is provided on this After Visit Summary.  MyChart is used to connect with patients for Virtual Visits (Telemedicine).  Patients are able to view lab/test  results, encounter notes, upcoming appointments, etc.  Non-urgent messages can be sent to your provider as well.   ?To learn more about what you can do with MyChart, go to [email or web address].   ? ?Your next appointment:   ?Follow up after testing ([facility] 07/12/21)  ? ?The format for your next appointment:   ?In Person ? ?Provider:   ?You may see Kate Sable, MD or one of the following Advanced Practice Providers on your designated Care Team:   ?Murray Hodgkins, NP ?Christell Faith, PA-C ?Cadence Kathlen Mody, PA-C  ? ? ?Other Instructions ? ? ?

## 2021-07-09 NOTE — Progress Notes (Signed)
Cardiology Office Note:    Date:  07/09/2021   ID:  Casey Cole, DOB 04-26-1976, MRN [id number]  PCP:  Valerie Roys, DO   [facility] Providers Cardiologist:  Kate Sable, MD     Referring MD: Valerie Roys, DO   Chief Complaint  Patient presents with   NEW patient-    Referred by Dr. Wynetta Emery for eval of SOB/chest pain. Patient also reports fatigue with exertion. Patient states he has a history of sleep apnea but is not using a CPAP at this time because it made him "feel like he was suffocating".   Casey Cole is a 46 y.o. male who is being seen today for the evaluation of chest pain at the request of Valerie Roys, DO.   History of Present Illness:    Casey Cole is a 46 y.o. male with a hx of hyperlipidemia, diabetes, current smoker x20+ years, OSA who presents due to chest pain.  Patient has central chest discomfort ongoing over the past month or so.  Symptoms sometimes occur after walking for long distances.  Also endorsed shortness of breath with exertion.  Previously used CPAP mask for OSA, stopped using due to mask being uncomfortable.  He states losing weight, has an appointment in the next several weeks with sleep specialist for OSA eval.  Has a history of hyperlipidemia, takes Lipitor infrequently.  His brother had an MI in his 41s.  Past Medical History:  Diagnosis Date   Abscess of superficial perineal space 08/23/2015   Depression 10/27/2015   Diabetes mellitus without complication ([place])    Hyperlipidemia    Hypertension    Recurrent major depressive disorder, in full remission ([place]) 10/26/2018   Sleep apnea     Past Surgical History:  Procedure Laterality Date   CHOLECYSTECTOMY     INCISE AND DRAIN ABCESS Left 07-12-15   left buttock   INCISION AND DRAINAGE PERIRECTAL ABSCESS N/A 08/23/2015   Procedure: IRRIGATION AND DEBRIDEMENT PERIRECTAL ABSCESS;  Surgeon: Robert Bellow, MD;  Location: [facility] ORS;  Service: General;  Laterality: N/A;  Anal  Scope   VASECTOMY      Current Medications: Current Meds  Medication Sig   atorvastatin (LIPITOR) 20 MG tablet Take 1 tablet (20 mg total) by mouth every other day.   cyclobenzaprine (FLEXERIL) 10 MG tablet Take 1 tablet (10 mg total) by mouth at bedtime.   Guselkumab (TREMFYA [place]) Inject 1 Dose into the skin as directed. Every 8 weeks after starter doses.   ivabradine (CORLANOR) 7.5 MG TABS tablet Take 2 tablets (15 mg total) by mouth once for 1 dose. Take 2 hours prior to your CT Scan.   metformin (FORTAMET) 1000 MG (OSM) 24 hr tablet Take 1 tablet (1,000 mg total) by mouth 2 (two) times daily with a meal.   metoprolol tartrate (LOPRESSOR) 100 MG tablet Take 1 tablet (100 mg total) by mouth once for 1 dose. Take 2 hours prior to your CT scan.   naproxen (NAPROSYN) 500 MG tablet Take 1 tablet (500 mg total) by mouth 2 (two) times daily with a meal.   ONE TOUCH ULTRA TEST test strip USE AS DIRECTED ONCE DAILY   Semaglutide, 1 MG/DOSE, (OZEMPIC, 1 MG/DOSE,) 2 MG/1.5ML SOPN Inject 1 mg into the skin once a week.     Allergies:   Methylprednisolone   Social History   Socioeconomic History   Marital status: Married    Spouse name: Not on file   Number of children: Not  on file   Years of education: Not on file   Highest education level: Not on file  Occupational History   Not on file  Tobacco Use   Smoking status: Every Day    Packs/day: 1.00    Types: Cigarettes   Smokeless tobacco: Never  Vaping Use   Vaping Use: Never used  Substance and Sexual Activity   Alcohol use: Not Currently   Drug use: No   Sexual activity: Yes  Other Topics Concern   Not on file  Social History Narrative   Not on file   Social Determinants of Health   Financial Resource Strain: Not on file  Food Insecurity: Not on file  Transportation Needs: Not on file  Physical Activity: Not on file  Stress: Not on file  Social Connections: Not on file     Family History: The patient's family history  includes Heart attack in his paternal grandfather; Hypertension in his father; Stroke in his paternal grandfather.  ROS:   Please see the history of present illness.     All other systems reviewed and are negative.  EKGs/Labs/Other Studies Reviewed:    The following studies were reviewed today:   EKG:  EKG is  ordered today.  The ekg ordered today demonstrates sinus rhythm.  Recent Labs: 01/01/2021: TSH 0.801 06/21/2021: ALT 17; BUN 11; Creatinine, Ser 0.95; Hemoglobin 16.1; Platelets 213; Potassium 3.8; Sodium 136  Recent Lipid Panel    Component Value Date/Time   CHOL 156 03/26/2021 1553   TRIG 241 (H) 03/26/2021 1553   HDL 32 (L) 03/26/2021 1553   LDLCALC 84 03/26/2021 1553     Risk Assessment/Calculations:          Physical Exam:    VS:  BP (!) 140/92 (BP Location: Right Arm, Patient Position: Sitting, Cuff Size: Large)    Pulse 94    Ht '5\' 8"'$  (1.727 m)    Wt 239 lb (108.4 kg)    SpO2 98%    BMI 36.34 kg/m     Wt Readings from Last 3 Encounters:  07/09/21 239 lb (108.4 kg)  06/22/21 232 lb 12.8 oz (105.6 kg)  06/21/21 240 lb (108.9 kg)     GEN:  Well nourished, well developed in no acute distress HEENT: Normal NECK: No JVD; No carotid bruits LYMPHATICS: No lymphadenopathy CARDIAC: RRR, no murmurs, rubs, gallops RESPIRATORY:  Clear to auscultation without rales, wheezing or rhonchi  ABDOMEN: Soft, non-tender, non-distended MUSCULOSKELETAL:  No edema; No deformity  SKIN: Warm and dry NEUROLOGIC:  Alert and oriented x 3 PSYCHIATRIC:  Normal affect   ASSESSMENT:    1. Precordial pain   2. Mixed hyperlipidemia   3. Smoking    PLAN:    In order of problems listed above:  Chest pain, risk factors hyperlipidemia, current smoker, family history of CAD.  Get echocardiogram, get coronary CTA. Hyperlipidemia, advised to take Lipitor daily. Current smoker, cessation advised.  Follow-up after cardiac testing.       Medication Adjustments/Labs and Tests  Ordered: Current medicines are reviewed at length with the patient today.  Concerns regarding medicines are outlined above.  Orders Placed This Encounter  Procedures   CT CORONARY MORPH W/CTA COR W/SCORE W/CA W/CM &/OR WO/CM   EKG 12-Lead   ECHOCARDIOGRAM COMPLETE   Meds ordered this encounter  Medications   metoprolol tartrate (LOPRESSOR) 100 MG tablet    Sig: Take 1 tablet (100 mg total) by mouth once for 1 dose. Take 2 hours prior to  your CT scan.    Dispense:  1 tablet    Refill:  0   ivabradine (CORLANOR) 7.5 MG TABS tablet    Sig: Take 2 tablets (15 mg total) by mouth once for 1 dose. Take 2 hours prior to your CT Scan.    Dispense:  2 tablet    Refill:  0    Patient Instructions  Medication Instructions:   Your physician recommends that you continue on your current medications as directed. Please refer to the Current Medication list given to you today.  *If you need a refill on your cardiac medications before your next appointment, please call your pharmacy*   Lab Work: None ordered If you have labs (blood work) drawn today and your tests are completely normal, you will receive your results only by: [place] (if you have MyChart) OR A paper copy in the mail If you have any lab test that is abnormal or we need to change your treatment, we will call you to review the results.   Testing/Procedures:  Your physician has requested that you have an echocardiogram. Echocardiography is a painless test that uses sound waves to create images of your heart. It provides your doctor with information about the size and shape of your heart and how well your hearts chambers and valves are working. This procedure takes approximately one hour. There are no restrictions for this procedure.   Your physician has requested that you have cardiac CT. Cardiac computed tomography (CT) is a painless test that uses an x-ray machine to take clear, detailed pictures of your heart.   Your  cardiac CT will be scheduled at:  [facility] [address] [place],  [address] [phone]  Thursday 07/12/21 at 2:00 PM  Please arrive 15 mins early for check-in and test prep.    Please follow these instructions carefully (unless otherwise directed):    On the Night Before the Test: Be sure to Drink plenty of water. Do not consume any caffeinated/decaffeinated beverages or chocolate 12 hours prior to your test.   On the Day of the Test: Drink plenty of water until 1 hour prior to the test. Do not eat any food 4 hours prior to the test. You may take your regular medications prior to the test.  Take metoprolol (Lopressor) 100 MG two hours prior to test. Take Ivabradine (Corlanor) 15 MG two hours prior to test.        After the Test: Drink plenty of water. After receiving IV contrast, you may experience a mild flushed feeling. This is normal. On occasion, you may experience a mild rash up to 24 hours after the test. This is not dangerous. If this occurs, you can take Benadryl 25 mg and increase your fluid intake. If you experience trouble breathing, this can be serious. If it is severe call 911 IMMEDIATELY. If it is mild, please call our office. If you take any of these medications: Glipizide/Metformin, Avandament, Glucavance, please do not take 48 hours after completing test unless otherwise instructed.  Please allow 2-4 weeks for scheduling of routine cardiac CTs. Some insurance companies require a pre-authorization which may delay scheduling of this test.   For non-scheduling related questions, please contact the cardiac imaging nurse navigator should you have any questions/concerns: Marchia Bond, Cardiac Imaging Nurse Navigator Gordy Clement, Cardiac Imaging Nurse Navigator [facility] Direct Office Dial: [phone]   For scheduling needs, including cancellations and rescheduling,  please  call [place], [phone].      Follow-Up: At [facility], you and your health needs are our priority.  As part of our continuing mission to provide you with exceptional heart care, we have created designated Provider Care Teams.  These Care Teams include your primary Cardiologist (physician) and Advanced Practice Providers (APPs -  Physician Assistants and Nurse Practitioners) who all work together to provide you with the care you need, when you need it.  We recommend signing up for the patient portal called "MyChart".  Sign up information is provided on this After Visit Summary.  MyChart is used to connect with patients for Virtual Visits (Telemedicine).  Patients are able to view lab/test results, encounter notes, upcoming appointments, etc.  Non-urgent messages can be sent to your provider as well.   To learn more about what you can do with MyChart, go to [email or web address].    Your next appointment:   Follow up after testing (CCTA 07/12/21)   The format for your next appointment:   In Person  Provider:   You may see Kate Sable, MD or one of the following Advanced Practice Providers on your designated Care Team:   Murray Hodgkins, NP Christell Faith, PA-C Cadence Kathlen Mody, [place]    Other Instructions     Signed, Kate Sable, MD  07/09/2021 10:48 AM    [place]

## 2021-07-11 ENCOUNTER — Telehealth (HOSPITAL_COMMUNITY): Payer: Self-pay | Admitting: *Deleted

## 2021-07-11 NOTE — Telephone Encounter (Signed)
Reaching out to patient to offer assistance regarding upcoming cardiac imaging study; pt verbalizes understanding of appt date/time, parking situation and where to check in, pre-test NPO status and medications ordered, and verified current allergies; name and call back number provided for further questions should they arise  Aneliese Beaudry RN Navigator Cardiac Imaging  Heart and Vascular [phone] office [phone] cell  Patient to take 100mg metoprolol tartrate and 15mg ivabradine two hours prior to his cardiac CT scan. 

## 2021-07-12 ENCOUNTER — Ambulatory Visit
Admission: RE | Admit: 2021-07-12 | Discharge: 2021-07-12 | Disposition: A | Discharge status: Home / Self Care | Payer: BC Managed Care – PPO | Source: Ambulatory Visit | Attending: Cardiology | Admitting: Cardiology

## 2021-07-12 ENCOUNTER — Other Ambulatory Visit: Payer: Self-pay | Admitting: Cardiology

## 2021-07-12 ENCOUNTER — Other Ambulatory Visit: Payer: Self-pay

## 2021-07-12 DIAGNOSIS — R072 Precordial pain: Secondary | ICD-10-CM | POA: Diagnosis not present

## 2021-07-12 DIAGNOSIS — R931 Abnormal findings on diagnostic imaging of heart and coronary circulation: Secondary | ICD-10-CM | POA: Diagnosis not present

## 2021-07-12 DIAGNOSIS — I2510 Atherosclerotic heart disease of native coronary artery without angina pectoris: Secondary | ICD-10-CM | POA: Diagnosis not present

## 2021-07-12 MED ORDER — METOPROLOL TARTRATE 5 MG/5ML IV SOLN
10.0000 mg | Freq: Once | INTRAVENOUS | Status: AC
  Administered 2021-07-12: 14:00:00 10 mg via INTRAVENOUS

## 2021-07-12 MED ORDER — IOHEXOL 350 MG/ML SOLN
100.0000 mL | Freq: Once | INTRAVENOUS | Status: AC | PRN
  Administered 2021-07-12: 14:00:00 100 mL via INTRAVENOUS

## 2021-07-12 MED ORDER — DILTIAZEM HCL 25 MG/5ML IV SOLN
10.0000 mg | Freq: Once | INTRAVENOUS | Status: AC
  Administered 2021-07-12: 15:00:00 10 mg via INTRAVENOUS

## 2021-07-12 MED ORDER — DILTIAZEM HCL 25 MG/5ML IV SOLN
10.0000 mg | Freq: Once | INTRAVENOUS | Status: AC
  Administered 2021-07-12: 14:00:00 10 mg via INTRAVENOUS

## 2021-07-12 MED ORDER — NITROGLYCERIN 0.4 MG SL SUBL
0.8000 mg | SUBLINGUAL_TABLET | Freq: Once | SUBLINGUAL | Status: AC
  Administered 2021-07-12: 14:00:00 0.8 mg via SUBLINGUAL

## 2021-07-12 NOTE — Progress Notes (Signed)
Patient tolerated CT well. Drank water after. Vital signs stable encourage to drink water throughout day.Reasons explained and verbalized understanding. Ambulated steady gait.  

## 2021-07-13 ENCOUNTER — Telehealth: Payer: Self-pay

## 2021-07-13 DIAGNOSIS — I2510 Atherosclerotic heart disease of native coronary artery without angina pectoris: Secondary | ICD-10-CM | POA: Diagnosis not present

## 2021-07-13 DIAGNOSIS — R931 Abnormal findings on diagnostic imaging of heart and coronary circulation: Secondary | ICD-10-CM | POA: Diagnosis not present

## 2021-07-13 MED ORDER — ASPIRIN EC 81 MG PO TBEC: 81.0000 mg | DELAYED_RELEASE_TABLET | Freq: Every day | ORAL | 3 refills | Status: DC

## 2021-07-13 MED ORDER — ATORVASTATIN CALCIUM 40 MG PO TABS: 40.0000 mg | ORAL_TABLET | ORAL | 1 refills | Status: DC

## 2021-07-13 MED ORDER — NITROGLYCERIN 0.4 MG SL SUBL: 0.4000 mg | SUBLINGUAL_TABLET | SUBLINGUAL | 1 refills | Status: DC | PRN

## 2021-07-13 NOTE — Telephone Encounter (Signed)
-----   Message from Kate Sable, MD sent at 07/13/2021  3:03 PM EST ----- ?Coronary CTA showing stenosis in the proximal PDA.  Advised patient to start aspirin 81 mg, increase Lipitor to 40 mg daily.  Please schedule follow-up appointment, patient will likely need left heart cath.  Prescribe sublingual nitro as needed chest pain. ?

## 2021-07-13 NOTE — Telephone Encounter (Signed)
Called patient and informed him of his CCTA results and Dr. Thereasa Solo recommendations. Patient has been scheduled for next week on 07/20/21. He verbalized understanding and agreed with plan. ?

## 2021-07-15 ENCOUNTER — Encounter: Payer: Self-pay | Admitting: Cardiology

## 2021-07-16 ENCOUNTER — Encounter: Payer: Self-pay | Admitting: Cardiology

## 2021-07-16 ENCOUNTER — Telehealth: Payer: Self-pay

## 2021-07-16 ENCOUNTER — Other Ambulatory Visit: Payer: Self-pay

## 2021-07-16 ENCOUNTER — Ambulatory Visit: Payer: BC Managed Care – PPO | Admitting: Cardiology

## 2021-07-16 VITALS — BP 122/80 | HR 94 | Ht 68.0 in | Wt 234.0 lb

## 2021-07-16 DIAGNOSIS — E782 Mixed hyperlipidemia: Secondary | ICD-10-CM

## 2021-07-16 DIAGNOSIS — I2510 Atherosclerotic heart disease of native coronary artery without angina pectoris: Secondary | ICD-10-CM

## 2021-07-16 DIAGNOSIS — F17200 Nicotine dependence, unspecified, uncomplicated: Secondary | ICD-10-CM | POA: Diagnosis not present

## 2021-07-16 NOTE — Progress Notes (Signed)
Cardiology Office Note:    Date:  07/16/2021   ID:  Casey Cole, DOB 09/25/1975, MRN [id number]  PCP:  Valerie Roys, DO   [facility] Providers Cardiologist:  Kate Sable, MD     Referring MD: Valerie Roys, DO   Chief Complaint  Patient presents with   Other    Follow up post CT -- Patient c.o chest pain and SOB. Meds reviewed verbally with patient.     History of Present Illness:    Casey Cole is a 46 y.o. male with a hx of hyperlipidemia, diabetes, smoker x20+ years, OSA who presents for follow-up.  Previously seen due to chest pain and shortness of breath with exertion.  He underwent a coronary CTA showing significant stenosis in the proximal PDA.  PDA not analyzed by FFR.  Left circumflex showing disease but not significant for FFR analysis.  Patient still has chest pain with exertion.  He stopped smoking yesterday, has on a nicotine patch on his arm.    Past Medical History:  Diagnosis Date   Abscess of superficial perineal space 08/23/2015   Depression 10/27/2015   Diabetes mellitus without complication ([place])    Hyperlipidemia    Hypertension    Recurrent major depressive disorder, in full remission ([place]) 10/26/2018   Sleep apnea     Past Surgical History:  Procedure Laterality Date   CHOLECYSTECTOMY     INCISE AND DRAIN ABCESS Left 07-12-15   left buttock   INCISION AND DRAINAGE PERIRECTAL ABSCESS N/A 08/23/2015   Procedure: IRRIGATION AND DEBRIDEMENT PERIRECTAL ABSCESS;  Surgeon: Robert Bellow, MD;  Location: [facility] ORS;  Service: General;  Laterality: N/A;  Anal Scope   VASECTOMY      Current Medications: Current Meds  Medication Sig   aspirin EC 81 MG tablet Take 1 tablet (81 mg total) by mouth daily. Swallow whole.   atorvastatin (LIPITOR) 40 MG tablet Take 1 tablet (40 mg total) by mouth every other day.   cyclobenzaprine (FLEXERIL) 10 MG tablet Take 1 tablet (10 mg total) by mouth at bedtime.   Guselkumab (TREMFYA [place]) Inject 1 Dose  into the skin as directed. Every 8 weeks after starter doses.   metformin (FORTAMET) 1000 MG (OSM) 24 hr tablet Take 1 tablet (1,000 mg total) by mouth 2 (two) times daily with a meal.   naproxen (NAPROSYN) 500 MG tablet Take 1 tablet (500 mg total) by mouth 2 (two) times daily with a meal.   nitroGLYCERIN (NITROSTAT) 0.4 MG SL tablet Place 1 tablet (0.4 mg total) under the tongue every 5 (five) minutes as needed for chest pain. For a maximum dose of 3 tablets.   ONE TOUCH ULTRA TEST test strip USE AS DIRECTED ONCE DAILY   Semaglutide, 1 MG/DOSE, (OZEMPIC, 1 MG/DOSE,) 2 MG/1.5ML SOPN Inject 1 mg into the skin once a week.     Allergies:   Methylprednisolone   Social History   Socioeconomic History   Marital status: Married    Spouse name: Not on file   Number of children: Not on file   Years of education: Not on file   Highest education level: Not on file  Occupational History   Not on file  Tobacco Use   Smoking status: Every Day    Packs/day: 1.00    Types: Cigarettes   Smokeless tobacco: Never  Vaping Use   Vaping Use: Never used  Substance and Sexual Activity   Alcohol use: Not Currently   Drug use: No  Sexual activity: Yes  Other Topics Concern   Not on file  Social History Narrative   Not on file   Social Determinants of Health   Financial Resource Strain: Not on file  Food Insecurity: Not on file  Transportation Needs: Not on file  Physical Activity: Not on file  Stress: Not on file  Social Connections: Not on file     Family History: The patient's family history includes Heart attack in his paternal grandfather; Hypertension in his father; Stroke in his paternal grandfather.  ROS:   Please see the history of present illness.     All other systems reviewed and are negative.  EKGs/Labs/Other Studies Reviewed:    The following studies were reviewed today:   EKG:  EKG is  ordered today.  The ekg ordered today demonstrates sinus rhythm, normal  ECG.  Recent Labs: 01/01/2021: TSH 0.801 06/21/2021: ALT 17; BUN 11; Creatinine, Ser 0.95; Hemoglobin 16.1; Platelets 213; Potassium 3.8; Sodium 136  Recent Lipid Panel    Component Value Date/Time   CHOL 156 03/26/2021 1553   TRIG 241 (H) 03/26/2021 1553   HDL 32 (L) 03/26/2021 1553   LDLCALC 84 03/26/2021 1553     Risk Assessment/Calculations:          Physical Exam:    VS:  BP 122/80 (BP Location: Left Arm, Patient Position: Sitting, Cuff Size: Normal)    Pulse 94    Ht '5\' 8"'$  (1.727 m)    Wt 234 lb (106.1 kg)    SpO2 98%    BMI 35.58 kg/m     Wt Readings from Last 3 Encounters:  07/16/21 234 lb (106.1 kg)  07/09/21 239 lb (108.4 kg)  06/22/21 232 lb 12.8 oz (105.6 kg)     GEN:  Well nourished, well developed in no acute distress HEENT: Normal NECK: No JVD; No carotid bruits LYMPHATICS: No lymphadenopathy CARDIAC: RRR, no murmurs, rubs, gallops RESPIRATORY:  Clear to auscultation without rales, wheezing or rhonchi  ABDOMEN: Soft, non-tender, non-distended MUSCULOSKELETAL:  No edema; No deformity  SKIN: Warm and dry NEUROLOGIC:  Alert and oriented x 3 PSYCHIATRIC:  Normal affect   ASSESSMENT:    1. Coronary artery disease involving native coronary artery of native heart, unspecified whether angina present   2. Mixed hyperlipidemia   3. Smoking     PLAN:    In order of problems listed above:  Chest pain, coronary CTA with calcium score 116, severe proximal PDA stenosis, left circumflex ostial stenosis.  Proximal PDA not evaluated by FFR.  Nonobstructive FFR disease in left circumflex.  Schedule left heart cath.  Start aspirin 81, Lipitor 40 mg daily, as needed sublingual nitro.  Obtain echo as scheduled. Hyperlipidemia, Lipitor 40 mg daily Stop smoking yesterday, encouraged to stay smoke-free, nicotine patch in the home.  Follow-up in 1 month.      Medication Adjustments/Labs and Tests Ordered: Current medicines are reviewed at length with the patient  today.  Concerns regarding medicines are outlined above.  Orders Placed This Encounter  Procedures   CBC   Basic metabolic panel   EKG 42-ASTM   No orders of the defined types were placed in this encounter.   Patient Instructions  Medication Instructions:   Your physician recommends that you continue on your current medications as directed. Please refer to the Current Medication list given to you today.  *If you need a refill on your cardiac medications before your next appointment, please call your pharmacy*   Lab Work:  CBC, BMP to be drawn in office today.  If you have labs (blood work) drawn today and your tests are completely normal, you will receive your results only by: [place] (if you have MyChart) OR A paper copy in the mail If you have any lab test that is abnormal or we need to change your treatment, we will call you to review the results.   Testing/Procedures:  You are scheduled for a Cardiac Catheterization on Tuesday, March 14 with Dr. Harrell Gave End.  1. Please arrive at the [facility] at 8:30 AM (This time is one hour before your procedure to ensure your preparation). Free valet parking service is available.   Special note: Every effort is made to have your procedure done on time. Please understand that emergencies sometimes delay scheduled procedures.  2. Diet: Do not eat solid foods after midnight.  You may have clear liquids until 5 AM upon the day of the procedure.  3. Labs: Drawn in office already.  4. Medication instructions in preparation for your procedure:   Contrast Allergy: No  Do not take Diabetes Med Glucophage (Metformin) on the day of the procedure and HOLD 48 HOURS AFTER THE PROCEDURE.  On the morning of your procedure, take Aspirin and any morning medicines NOT listed above.  You may use sips of water.  5. Plan to go home the same day, you will only stay overnight if medically necessary. 6. You MUST have a responsible adult  to drive you home. 7. An adult MUST be with you the first 24 hours after you arrive home. 8. Bring a current list of your medications, and the last time and date medication taken. 9. Bring ID and current insurance cards. 10.Please wear clothes that are easy to get on and off and wear slip-on shoes.  Thank you for allowing [place] to care for you!   -- [place] [facility]   Follow-Up: At [facility], you and your health needs are our priority.  As part of our continuing mission to provide you with exceptional heart care, we have created designated Provider Care Teams.  These Care Teams include your primary Cardiologist (physician) and Advanced Practice Providers (APPs -  Physician Assistants and Nurse Practitioners) who all work together to provide you with the care you need, when you need it.  We recommend signing up for the patient portal called "MyChart".  Sign up information is provided on this After Visit Summary.  MyChart is used to connect with patients for Virtual Visits (Telemedicine).  Patients are able to view lab/test results, encounter notes, upcoming appointments, etc.  Non-urgent messages can be sent to your provider as well.   To learn more about what you can do with MyChart, go to [email or web address].    Your next appointment:   1 month(s)  The format for your next appointment:   In Person  Provider:   Kate Sable, MD    Other Instructions     Signed, Kate Sable, MD  07/16/2021 12:28 PM    [place]

## 2021-07-16 NOTE — Patient Instructions (Signed)
Medication Instructions:  ? ?Your physician recommends that you continue on your current medications as directed. Please refer to the Current Medication list given to you today. ? ?*If you need a refill on your cardiac medications before your next appointment, please call your pharmacy* ? ? ?Lab Work: ? ?CBC, BMP to be drawn in office today. ? ?If you have labs (blood work) drawn today and your tests are completely normal, you will receive your results only by: ?MyChart Message (if you have MyChart) OR ?A paper copy in the mail ?If you have any lab test that is abnormal or we need to change your treatment, we will call you to review the results. ? ? ?Testing/Procedures: ? ?You are scheduled for a Cardiac Catheterization on Tuesday, March 14 with Dr. Harrell Gave End. ? ?1. Please arrive at the [facility] at 8:30 AM (This time is one hour before your procedure to ensure your preparation). Free valet parking service is available.  ? ?Special note: Every effort is made to have your procedure done on time. Please understand that emergencies sometimes delay scheduled procedures. ? ?2. Diet: Do not eat solid foods after midnight.  You may have clear liquids until 5 AM upon the day of the procedure. ? ?3. Labs: Drawn in office already. ? ?4. Medication instructions in preparation for your procedure: ? ? Contrast Allergy: No ? ?Do not take Diabetes Med Glucophage (Metformin) on the day of the procedure and HOLD 48 HOURS AFTER THE PROCEDURE. ? ?On the morning of your procedure, take Aspirin and any morning medicines NOT listed above.  You may use sips of water. ? ?5. Plan to go home the same day, you will only stay overnight if medically necessary. ?6. You MUST have a responsible adult to drive you home. ?7. An adult MUST be with you the first 24 hours after you arrive home. ?8. Bring a current list of your medications, and the last time and date medication taken. ?9. Bring ID and current insurance cards. ?10.Please wear  clothes that are easy to get on and off and wear slip-on shoes. ? ?Thank you for allowing [place] to care for you! ?  --  Invasive Cardiovascular services ? ? ?Follow-Up: ?At [facility], you and your health needs are our priority.  As part of our continuing mission to provide you with exceptional heart care, we have created designated Provider Care Teams.  These Care Teams include your primary Cardiologist (physician) and Advanced Practice Providers (APPs -  Physician Assistants and Nurse Practitioners) who all work together to provide you with the care you need, when you need it. ? ?We recommend signing up for the patient portal called "MyChart".  Sign up information is provided on this After Visit Summary.  MyChart is used to connect with patients for Virtual Visits (Telemedicine).  Patients are able to view lab/test results, encounter notes, upcoming appointments, etc.  Non-urgent messages can be sent to your provider as well.   ?To learn more about what you can do with MyChart, go to [email or web address].   ? ?Your next appointment:   ?1 month(s) ? ?The format for your next appointment:   ?In Person ? ?Provider:   ?Kate Sable, MD  ? ? ?Other Instructions ? ? ?

## 2021-07-16 NOTE — H&P (View-Only) (Signed)
?Cardiology Office Note:   ? ?Date:  07/16/2021  ? ?ID:  Casey Cole, DOB [birth year], MRN [id number] ? ?PCP:  Valerie Roys, DO ?  ?[facility] Providers ?Cardiologist:  Kate Sable, MD    ? ?Referring MD: Valerie Roys, DO  ? ?Chief Complaint  ?Patient presents with  ? Other  ?  Follow up post CT -- Patient c.o chest pain and SOB. Meds reviewed verbally with patient.   ? ? ?History of Present Illness:   ? ?Casey Cole is a 46 y.o. male with a hx of hyperlipidemia, diabetes, smoker x20+ years, OSA who presents for follow-up.  Previously seen due to chest pain and shortness of breath with exertion. ? ?He underwent a coronary CTA showing significant stenosis in the proximal PDA.  PDA not analyzed by FFR.  Left circumflex showing disease but not significant for FFR analysis.  Patient still has chest pain with exertion.  He stopped smoking yesterday, has on a nicotine patch on his arm. ? ? ? ?Past Medical History:  ?Diagnosis Date  ? Abscess of superficial perineal space 08/23/2015  ? Depression 10/27/2015  ? Diabetes mellitus without complication ([place])   ? Hyperlipidemia   ? Hypertension   ? Recurrent major depressive disorder, in full remission ([place]) 10/26/2018  ? Sleep apnea   ? ? ?Past Surgical History:  ?Procedure Laterality Date  ? CHOLECYSTECTOMY    ? INCISE AND DRAIN ABCESS Left 07-12-15  ? left buttock  ? INCISION AND DRAINAGE PERIRECTAL ABSCESS N/A 08/23/2015  ? Procedure: IRRIGATION AND DEBRIDEMENT PERIRECTAL ABSCESS;  Surgeon: Robert Bellow, MD;  Location: [facility] ORS;  Service: General;  Laterality: N/A;  Anal Scope  ? VASECTOMY    ? ? ?Current Medications: ?Current Meds  ?Medication Sig  ? aspirin EC 81 MG tablet Take 1 tablet (81 mg total) by mouth daily. Swallow whole.  ? atorvastatin (LIPITOR) 40 MG tablet Take 1 tablet (40 mg total) by mouth every other day.  ? cyclobenzaprine (FLEXERIL) 10 MG tablet Take 1 tablet (10 mg total) by mouth at bedtime.  ? Guselkumab (TREMFYA [place]) Inject 1 Dose  into the skin as directed. Every 8 weeks after starter doses.  ? metformin (FORTAMET) 1000 MG (OSM) 24 hr tablet Take 1 tablet (1,000 mg total) by mouth 2 (two) times daily with a meal.  ? naproxen (NAPROSYN) 500 MG tablet Take 1 tablet (500 mg total) by mouth 2 (two) times daily with a meal.  ? nitroGLYCERIN (NITROSTAT) 0.4 MG SL tablet Place 1 tablet (0.4 mg total) under the tongue every 5 (five) minutes as needed for chest pain. For a maximum dose of 3 tablets.  ? ONE TOUCH ULTRA TEST test strip USE AS DIRECTED ONCE DAILY  ? Semaglutide, 1 MG/DOSE, (OZEMPIC, 1 MG/DOSE,) 2 MG/1.5ML SOPN Inject 1 mg into the skin once a week.  ?  ? ?Allergies:   Methylprednisolone  ? ?Social History  ? ?Socioeconomic History  ? Marital status: Married  ?  Spouse name: Not on file  ? Number of children: Not on file  ? Years of education: Not on file  ? Highest education level: Not on file  ?Occupational History  ? Not on file  ?Tobacco Use  ? Smoking status: Every Day  ?  Packs/day: 1.00  ?  Types: Cigarettes  ? Smokeless tobacco: Never  ?Vaping Use  ? Vaping Use: Never used  ?Substance and Sexual Activity  ? Alcohol use: Not Currently  ? Drug use: No  ?  Sexual activity: Yes  ?Other Topics Concern  ? Not on file  ?Social History Narrative  ? Not on file  ? ?Social Determinants of Health  ? ?Financial Resource Strain: Not on file  ?Food Insecurity: Not on file  ?Transportation Needs: Not on file  ?Physical Activity: Not on file  ?Stress: Not on file  ?Social Connections: Not on file  ?  ? ?Family History: ?The patient's family history includes Heart attack in his paternal grandfather; Hypertension in his father; Stroke in his paternal grandfather. ? ?ROS:   ?Please see the history of present illness.    ? All other systems reviewed and are negative. ? ?EKGs/Labs/Other Studies Reviewed:   ? ?The following studies were reviewed today: ? ? ?EKG:  EKG is  ordered today.  The ekg ordered today demonstrates sinus rhythm, normal  ECG. ? ?Recent Labs: ?01/01/2021: TSH 0.801 ?06/21/2021: ALT 17; BUN 11; Creatinine, Ser 0.95; Hemoglobin 16.1; Platelets 213; Potassium 3.8; Sodium 136  ?Recent Lipid Panel ?   ?Component Value Date/Time  ? CHOL 156 03/26/2021 1553  ? TRIG 241 (H) 03/26/2021 1553  ? HDL 32 (L) 03/26/2021 1553  ? [place] 84 03/26/2021 1553  ? ? ? ?Risk Assessment/Calculations:   ? ? ?    ? ?Physical Exam:   ? ?VS:  BP 122/80 (BP Location: Left Arm, Patient Position: Sitting, Cuff Size: Normal)   Pulse 94   Ht '5\' 8"'$  (1.727 m)   Wt 234 lb (106.1 kg)   SpO2 98%   BMI 35.58 kg/m?    ? ?Wt Readings from Last 3 Encounters:  ?07/16/21 234 lb (106.1 kg)  ?07/09/21 239 lb (108.4 kg)  ?06/22/21 232 lb 12.8 oz (105.6 kg)  ?  ? ?GEN:  Well nourished, well developed in no acute distress ?HEENT: Normal ?NECK: No JVD; No carotid bruits ?LYMPHATICS: No lymphadenopathy ?CARDIAC: RRR, no murmurs, rubs, gallops ?RESPIRATORY:  Clear to auscultation without rales, wheezing or rhonchi  ?ABDOMEN: Soft, non-tender, non-distended ?MUSCULOSKELETAL:  No edema; No deformity  ?SKIN: Warm and dry ?NEUROLOGIC:  Alert and oriented x 3 ?PSYCHIATRIC:  Normal affect  ? ?ASSESSMENT:   ? ?1. Coronary artery disease involving native coronary artery of native heart, unspecified whether angina present   ?2. Mixed hyperlipidemia   ?3. Smoking   ? ? ?PLAN:   ? ?In order of problems listed above: ? ?Chest pain, coronary CTA with calcium score 116, severe proximal PDA stenosis, left circumflex ostial stenosis.  Proximal PDA not evaluated by FFR.  Nonobstructive FFR disease in left circumflex.  Schedule left heart cath.  Start aspirin 81, Lipitor 40 mg daily, as needed sublingual nitro.  Obtain echo as scheduled. ?Hyperlipidemia, Lipitor 40 mg daily ?Stop smoking yesterday, encouraged to stay smoke-free, nicotine patch in the home. ? ?Follow-up in 1 month. ? ?   ? ?Medication Adjustments/Labs and Tests Ordered: ?Current medicines are reviewed at length with the patient  today.  Concerns regarding medicines are outlined above.  ?Orders Placed This Encounter  ?Procedures  ? CBC  ? Basic metabolic panel  ? EKG 12-Lead  ? ?No orders of the defined types were placed in this encounter. ? ? ?Patient Instructions  ?Medication Instructions:  ? ?Your physician recommends that you continue on your current medications as directed. Please refer to the Current Medication list given to you today. ? ?*If you need a refill on your cardiac medications before your next appointment, please call your pharmacy* ? ? ?Lab Work: ? ?CBC, BMP to be  drawn in office today. ? ?If you have labs (blood work) drawn today and your tests are completely normal, you will receive your results only by: ?MyChart Message (if you have MyChart) OR ?A paper copy in the mail ?If you have any lab test that is abnormal or we need to change your treatment, we will call you to review the results. ? ? ?Testing/Procedures: ? ?You are scheduled for a Cardiac Catheterization on Tuesday, March 14 with Dr. Harrell Gave End. ? ?1. Please arrive at the [facility] at 8:30 AM (This time is one hour before your procedure to ensure your preparation). Free valet parking service is available.  ? ?Special note: Every effort is made to have your procedure done on time. Please understand that emergencies sometimes delay scheduled procedures. ? ?2. Diet: Do not eat solid foods after midnight.  You may have clear liquids until 5 AM upon the day of the procedure. ? ?3. Labs: Drawn in office already. ? ?4. Medication instructions in preparation for your procedure: ? ? Contrast Allergy: No ? ?Do not take Diabetes Med Glucophage (Metformin) on the day of the procedure and HOLD 48 HOURS AFTER THE PROCEDURE. ? ?On the morning of your procedure, take Aspirin and any morning medicines NOT listed above.  You may use sips of water. ? ?5. Plan to go home the same day, you will only stay overnight if medically necessary. ?6. You MUST have a responsible adult  to drive you home. ?7. An adult MUST be with you the first 24 hours after you arrive home. ?8. Bring a current list of your medications, and the last time and date medication taken. ?9. Bring ID and current insurance cards.

## 2021-07-17 ENCOUNTER — Encounter
Admission: RE | Disposition: A | Discharge status: Home / Self Care | Payer: Self-pay | Source: Home / Self Care | Attending: Internal Medicine

## 2021-07-17 ENCOUNTER — Other Ambulatory Visit: Payer: Self-pay

## 2021-07-17 ENCOUNTER — Encounter: Payer: Self-pay | Admitting: Internal Medicine

## 2021-07-17 ENCOUNTER — Ambulatory Visit
Admission: RE | Admit: 2021-07-17 | Discharge: 2021-07-17 | Disposition: A | Discharge status: Home / Self Care | Payer: BC Managed Care – PPO | Attending: Internal Medicine | Admitting: Internal Medicine

## 2021-07-17 DIAGNOSIS — R0602 Shortness of breath: Secondary | ICD-10-CM | POA: Diagnosis not present

## 2021-07-17 DIAGNOSIS — I25110 Atherosclerotic heart disease of native coronary artery with unstable angina pectoris: Secondary | ICD-10-CM | POA: Diagnosis not present

## 2021-07-17 DIAGNOSIS — E119 Type 2 diabetes mellitus without complications: Secondary | ICD-10-CM | POA: Insufficient documentation

## 2021-07-17 DIAGNOSIS — Z7982 Long term (current) use of aspirin: Secondary | ICD-10-CM | POA: Diagnosis not present

## 2021-07-17 DIAGNOSIS — E782 Mixed hyperlipidemia: Secondary | ICD-10-CM | POA: Insufficient documentation

## 2021-07-17 DIAGNOSIS — Z79899 Other long term (current) drug therapy: Secondary | ICD-10-CM | POA: Diagnosis not present

## 2021-07-17 DIAGNOSIS — I2510 Atherosclerotic heart disease of native coronary artery without angina pectoris: Secondary | ICD-10-CM

## 2021-07-17 DIAGNOSIS — F17210 Nicotine dependence, cigarettes, uncomplicated: Secondary | ICD-10-CM | POA: Diagnosis not present

## 2021-07-17 DIAGNOSIS — I10 Essential (primary) hypertension: Secondary | ICD-10-CM | POA: Diagnosis not present

## 2021-07-17 DIAGNOSIS — Z7985 Long-term (current) use of injectable non-insulin antidiabetic drugs: Secondary | ICD-10-CM | POA: Diagnosis not present

## 2021-07-17 DIAGNOSIS — Z7984 Long term (current) use of oral hypoglycemic drugs: Secondary | ICD-10-CM | POA: Insufficient documentation

## 2021-07-17 DIAGNOSIS — G4733 Obstructive sleep apnea (adult) (pediatric): Secondary | ICD-10-CM | POA: Insufficient documentation

## 2021-07-17 HISTORY — PX: LEFT HEART CATH AND CORONARY ANGIOGRAPHY: CATH118249

## 2021-07-17 HISTORY — PX: CORONARY PRESSURE/FFR WITH 3D MAPPING: CATH118309

## 2021-07-17 LAB — POCT ACTIVATED CLOTTING TIME: Activated Clotting Time: 329 seconds

## 2021-07-17 LAB — CBC
Hematocrit: 50.2 % (ref 37.5–51.0)
Hemoglobin: 17.1 g/dL (ref 13.0–17.7)
MCH: 31.3 pg (ref 26.6–33.0)
MCHC: 34.1 g/dL (ref 31.5–35.7)
MCV: 92 fL (ref 79–97)
Platelets: 219 10*3/uL (ref 150–450)
RBC: 5.46 x10E6/uL (ref 4.14–5.80)
RDW: 13.2 % (ref 11.6–15.4)
WBC: 12.8 10*3/uL — ABNORMAL HIGH (ref 3.4–10.8)

## 2021-07-17 LAB — BASIC METABOLIC PANEL
BUN/Creatinine Ratio: 9 (ref 9–20)
BUN: 10 mg/dL (ref 6–24)
CO2: 21 mmol/L (ref 20–29)
Calcium: 9.7 mg/dL (ref 8.7–10.2)
Chloride: 96 mmol/L (ref 96–106)
Creatinine, Ser: 1.16 mg/dL (ref 0.76–1.27)
Glucose: 148 mg/dL — ABNORMAL HIGH (ref 70–99)
Potassium: 4.1 mmol/L (ref 3.5–5.2)
Sodium: 142 mmol/L (ref 134–144)
eGFR: 80 mL/min/{1.73_m2} (ref >59)

## 2021-07-17 LAB — GLUCOSE, CAPILLARY: Glucose-Capillary: 158 mg/dL — ABNORMAL HIGH (ref 70–99)

## 2021-07-17 SURGERY — LEFT HEART CATH AND CORONARY ANGIOGRAPHY
Anesthesia: Moderate Sedation

## 2021-07-17 MED ORDER — HEPARIN (PORCINE) IN NACL 2000-0.9 UNIT/L-% IV SOLN
INTRAVENOUS | Status: DC | PRN
  Administered 2021-07-17: 1000 mL

## 2021-07-17 MED ORDER — FENTANYL CITRATE (PF) 100 MCG/2ML IJ SOLN
INTRAMUSCULAR | Status: DC | PRN
  Administered 2021-07-17: 50 ug via INTRAVENOUS
  Administered 2021-07-17: 25 ug via INTRAVENOUS

## 2021-07-17 MED ORDER — HEPARIN SODIUM (PORCINE) 1000 UNIT/ML IJ SOLN
INTRAMUSCULAR | Status: AC
  Filled 2021-07-17: qty 10

## 2021-07-17 MED ORDER — METFORMIN HCL ER (OSM) 1000 MG PO TB24: 1000.0000 mg | ORAL_TABLET | Freq: Two times a day (BID) | ORAL | 1 refills | Status: DC

## 2021-07-17 MED ORDER — VERAPAMIL HCL 2.5 MG/ML IV SOLN
INTRAVENOUS | Status: AC
  Filled 2021-07-17: qty 2

## 2021-07-17 MED ORDER — HEPARIN (PORCINE) IN NACL 1000-0.9 UT/500ML-% IV SOLN
INTRAVENOUS | Status: AC
  Filled 2021-07-17: qty 1000

## 2021-07-17 MED ORDER — NITROGLYCERIN 1 MG/10 ML FOR IR/CATH LAB
INTRA_ARTERIAL | Status: DC | PRN
  Administered 2021-07-17: 200 ug via INTRACORONARY
  Administered 2021-07-17: 100 ug via INTRACORONARY

## 2021-07-17 MED ORDER — SODIUM CHLORIDE 0.9% FLUSH: 3.0000 mL | Freq: Two times a day (BID) | INTRAVENOUS | Status: DC

## 2021-07-17 MED ORDER — LIDOCAINE HCL (PF) 1 % IJ SOLN
INTRAMUSCULAR | Status: DC | PRN
  Administered 2021-07-17: 2 mL

## 2021-07-17 MED ORDER — SODIUM CHLORIDE 0.9 % WEIGHT BASED INFUSION
1.0000 mL/kg/h | INTRAVENOUS | Status: DC
  Administered 2021-07-17: 1 mL/kg/h via INTRAVENOUS

## 2021-07-17 MED ORDER — NITROGLYCERIN 1 MG/10 ML FOR IR/CATH LAB
INTRA_ARTERIAL | Status: AC
  Filled 2021-07-17: qty 10

## 2021-07-17 MED ORDER — SODIUM CHLORIDE 0.9 % WEIGHT BASED INFUSION
3.0000 mL/kg/h | INTRAVENOUS | Status: AC
  Administered 2021-07-17: 3 mL/kg/h via INTRAVENOUS

## 2021-07-17 MED ORDER — HYDRALAZINE HCL 20 MG/ML IJ SOLN: 10.0000 mg | INTRAMUSCULAR | Status: DC | PRN

## 2021-07-17 MED ORDER — ASPIRIN 81 MG PO CHEW: 81.0000 mg | CHEWABLE_TABLET | Freq: Every day | ORAL | Status: DC

## 2021-07-17 MED ORDER — METOPROLOL TARTRATE 25 MG PO TABS
25.0000 mg | ORAL_TABLET | Freq: Two times a day (BID) | ORAL | 5 refills | Status: DC
Start: 2021-07-17 — End: 2021-09-17

## 2021-07-17 MED ORDER — SODIUM CHLORIDE 0.9% FLUSH: 3.0000 mL | INTRAVENOUS | Status: DC | PRN

## 2021-07-17 MED ORDER — MIDAZOLAM HCL 2 MG/2ML IJ SOLN
INTRAMUSCULAR | Status: DC | PRN
  Administered 2021-07-17 (×2): 1 mg via INTRAVENOUS

## 2021-07-17 MED ORDER — IOHEXOL 300 MG/ML  SOLN
INTRAMUSCULAR | Status: DC | PRN
  Administered 2021-07-17: 92 mL

## 2021-07-17 MED ORDER — ONDANSETRON HCL 4 MG/2ML IJ SOLN: 4.0000 mg | Freq: Four times a day (QID) | INTRAMUSCULAR | Status: DC | PRN

## 2021-07-17 MED ORDER — FENTANYL CITRATE (PF) 100 MCG/2ML IJ SOLN
INTRAMUSCULAR | Status: AC
  Filled 2021-07-17: qty 2

## 2021-07-17 MED ORDER — HEPARIN SODIUM (PORCINE) 1000 UNIT/ML IJ SOLN
INTRAMUSCULAR | Status: DC | PRN
  Administered 2021-07-17: 5000 [IU] via INTRAVENOUS
  Administered 2021-07-17: 6000 [IU] via INTRAVENOUS

## 2021-07-17 MED ORDER — SODIUM CHLORIDE 0.9 % IV SOLN: 250.0000 mL | INTRAVENOUS | Status: DC | PRN

## 2021-07-17 MED ORDER — VERAPAMIL HCL 2.5 MG/ML IV SOLN
INTRAVENOUS | Status: DC | PRN
  Administered 2021-07-17: 2.5 mg via INTRAVENOUS

## 2021-07-17 MED ORDER — LIDOCAINE HCL 1 % IJ SOLN
INTRAMUSCULAR | Status: AC
  Filled 2021-07-17: qty 20

## 2021-07-17 MED ORDER — ACETAMINOPHEN 325 MG PO TABS: 650.0000 mg | ORAL_TABLET | ORAL | Status: DC | PRN

## 2021-07-17 MED ORDER — MIDAZOLAM HCL 2 MG/2ML IJ SOLN
INTRAMUSCULAR | Status: AC
Start: 2021-07-17 — End: ?
  Filled 2021-07-17: qty 2

## 2021-07-17 MED ORDER — SODIUM CHLORIDE 0.9 % IV SOLN: INTRAVENOUS | Status: DC

## 2021-07-17 MED ORDER — LABETALOL HCL 5 MG/ML IV SOLN: 10.0000 mg | INTRAVENOUS | Status: DC | PRN

## 2021-07-17 SURGICAL SUPPLY — 15 items
CATH 5F 110X4 TIG (CATHETERS) ×1 IMPLANT
CATH INFINITI 5FR ANG PIGTAIL (CATHETERS) ×1 IMPLANT
CATH LAUNCHER 6FR EBU3.5 (CATHETERS) ×1 IMPLANT
CATH LAUNCHER 6FR JR4 (CATHETERS) ×1 IMPLANT
DEVICE RAD COMP TR BAND LRG (VASCULAR PRODUCTS) ×1 IMPLANT
DRAPE BRACHIAL (DRAPES) ×1 IMPLANT
GLIDESHEATH SLEND SS 6F .021 (SHEATH) ×1 IMPLANT
GUIDEWIRE INQWIRE 1.5J.035X260 (WIRE) IMPLANT
GUIDEWIRE PRESS OMNI 185 ST (WIRE) ×1 IMPLANT
INQWIRE 1.5J .035X260CM (WIRE) ×3
KIT ENCORE 26 ADVANTAGE (KITS) ×1 IMPLANT
PACK CARDIAC CATH (CUSTOM PROCEDURE TRAY) ×3 IMPLANT
PROTECTION STATION PRESSURIZED (MISCELLANEOUS) ×3
SET ATX SIMPLICITY (MISCELLANEOUS) ×1 IMPLANT
STATION PROTECTION PRESSURIZED (MISCELLANEOUS) IMPLANT

## 2021-07-17 NOTE — Interval H&P Note (Signed)
History and Physical Interval Note: ? ?07/17/2021 ?9:14 AM ? ?Casey Cole  has presented today for surgery, with the diagnosis of coronary artery disease with unstable angina.  The various methods of treatment have been discussed with the patient and family. After consideration of risks, benefits and other options for treatment, the patient has consented to  Procedure(s): ?LEFT HEART CATH AND CORONARY ANGIOGRAPHY (Left) as a surgical intervention.  The patient's history has been reviewed, patient examined, no change in status, stable for surgery.  I have reviewed the patient's chart and labs.  Questions were answered to the patient's satisfaction.   ? ?Cath Lab Visit (complete for each Cath Lab visit) ? ?Clinical Evaluation Leading to the Procedure:  ? ?ACS: No. ? ?Non-ACS:   ? ?Anginal Classification: CCS IV ? ?Anti-ischemic medical therapy: No Therapy ? ?Non-Invasive Test Results: Intermediate-risk stress test findings: cardiac mortality 1-3%/year (coronary CTA with concern for severe rPDA disease) ? ?Prior CABG: No previous CABG ? ?Casey Cole ? ? ?

## 2021-07-18 ENCOUNTER — Encounter: Payer: Self-pay | Admitting: Internal Medicine

## 2021-07-20 ENCOUNTER — Ambulatory Visit: Payer: BC Managed Care – PPO | Admitting: Cardiology

## 2021-07-23 ENCOUNTER — Ambulatory Visit: Payer: BC Managed Care – PPO

## 2021-07-23 ENCOUNTER — Other Ambulatory Visit: Payer: Self-pay

## 2021-07-23 DIAGNOSIS — R072 Precordial pain: Secondary | ICD-10-CM

## 2021-07-23 LAB — ECHOCARDIOGRAM COMPLETE
AR max vel: 2.73 cm2
AV Area VTI: 2.43 cm2
AV Area mean vel: 2.6 cm2
AV Mean grad: 3 mmHg
AV Peak grad: 4.7 mmHg
Ao pk vel: 1.08 m/s
Area-P 1/2: 5.62 cm2
Calc EF: 63.2 %
S' Lateral: 2.4 cm
Single Plane A2C EF: 67.5 %
Single Plane A4C EF: 58.2 %

## 2021-07-24 ENCOUNTER — Encounter: Payer: Self-pay | Admitting: Family Medicine

## 2021-07-24 ENCOUNTER — Ambulatory Visit: Payer: BC Managed Care – PPO | Admitting: Family Medicine

## 2021-07-24 VITALS — BP 99/65 | HR 92 | Temp 98.3°F | Wt 234.0 lb

## 2021-07-24 DIAGNOSIS — I25110 Atherosclerotic heart disease of native coronary artery with unstable angina pectoris: Secondary | ICD-10-CM

## 2021-07-24 DIAGNOSIS — M549 Dorsalgia, unspecified: Secondary | ICD-10-CM

## 2021-07-24 MED ORDER — CYCLOBENZAPRINE HCL 10 MG PO TABS: 10.0000 mg | ORAL_TABLET | Freq: Every day | ORAL | 1 refills | Status: DC

## 2021-07-24 MED ORDER — DICLOFENAC SODIUM 1 % EX GEL: 4.0000 g | Freq: Four times a day (QID) | CUTANEOUS | 3 refills | Status: DC

## 2021-07-24 NOTE — Progress Notes (Signed)
? ?BP 99/65   Pulse 92   Temp 98.3 ?F (36.8 ?C)   Wt 234 lb (106.1 kg)   SpO2 98%   BMI 35.58 kg/m?   ? ?Subjective:  ? ? Patient ID: Casey Cole, male    DOB: [birth year], 46 y.o.   MRN: [id number] ? ?HPI: ?Casey Cole is a 46 y.o. male ? ?Chief Complaint  ?Patient presents with  ? Chest Pain  ?  Patient states he is not having much chest pain anymore, has seen cardiology.   ? Back Pain  ?  Patient states he has mid to upper back pain, states there was something noted on a recent CT.   ? ?CORONARY ARTERY DISEASE- Had heart cath done last week. Found to have multivessel disease- to be managed medically at this time. Feeling well on meds ?Angina frequency: a few times a week ?Chest pain: yes ?Edema: no ?Orthopnea: no ?Paroxysmal nocturnal dyspnea: no ?Dyspnea on exertion: yes ?Pneumovax:  Up to Date ?Aspirin: yes ? 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 He was concerned that there may have been a fracture. He notes that he has been having some tightness and pain in his upper back. It's not radiating. Better with flexeril. Worse with certain movements. He is otherwise doing well with no other concerns or complaints at this time.  ? ?Relevant past medical, surgical, family and social history reviewed and updated as indicated. Interim medical history since our last visit reviewed. ?Allergies and medications reviewed and updated. ? ?Review of Systems  ?Constitutional: Negative.   ?Respiratory: Negative.    ?Cardiovascular:  Positive for chest pain. Negative for palpitations and leg swelling.  ?Gastrointestinal: Negative.   ?Musculoskeletal:  Positive for back pain. Negative for arthralgias, gait problem, joint swelling, myalgias, neck pain and neck stiffness.  ?Skin: Negative.   ?Psychiatric/Behavioral: Negative.    ? ?Per HPI unless specifically indicated above ? ?   ?Objective:  ?  ?BP 99/65   Pulse 92   Temp 98.3 ?F (36.8 ?C)   Wt 234 lb (106.1 kg)   SpO2 98%   BMI 35.58 kg/m?   ?Wt Readings from Last  3 Encounters:  ?07/24/21 234 lb (106.1 kg)  ?07/17/21 232 lb (105.2 kg)  ?07/16/21 234 lb (106.1 kg)  ?  ?Physical Exam ?Vitals and nursing note reviewed.  ?Constitutional:   ?   General: He is not in acute distress. ?   Appearance: Normal appearance. He is not ill-appearing, toxic-appearing or diaphoretic.  ?HENT:  ?   Head: Normocephalic and atraumatic.  ?   Right Ear: External ear normal.  ?   Left Ear: External ear normal.  ?   Nose: Nose normal.  ?   Mouth/Throat:  ?   Mouth: Mucous membranes are moist.  ?   Pharynx: Oropharynx is clear.  ?Eyes:  ?   General: No scleral icterus.    ?   Right eye: No discharge.     ?   Left eye: No discharge.  ?   Extraocular Movements: Extraocular movements intact.  ?   Conjunctiva/sclera: Conjunctivae normal.  ?   Pupils: Pupils are equal, round, and reactive to light.  ?Cardiovascular:  ?   Rate and Rhythm: Normal rate and regular rhythm.  ?   Pulses: Normal pulses.  ?   Heart sounds: Normal heart sounds. No murmur heard. ?  No friction rub. No gallop.  ?Pulmonary:  ?   Effort: Pulmonary effort is normal. No respiratory distress.  ?  Breath sounds: Normal breath sounds. No stridor. No wheezing, rhonchi or rales.  ?Chest:  ?   Chest wall: No tenderness.  ?Musculoskeletal:     ?   General: Normal range of motion.  ?   Cervical back: Normal range of motion and neck supple.  ?Skin: ?   General: Skin is warm and dry.  ?   Capillary Refill: Capillary refill takes less than 2 seconds.  ?   Coloration: Skin is not jaundiced or pale.  ?   Findings: No bruising, erythema, lesion or rash.  ?Neurological:  ?   General: No focal deficit present.  ?   Mental Status: He is alert and oriented to person, place, and time. Mental status is at baseline.  ?Psychiatric:     ?   Mood and Affect: Mood normal.     ?   Behavior: Behavior normal.     ?   Thought Content: Thought content normal.     ?   Judgment: Judgment normal.  ? ? ?Results for orders placed or performed in visit on 07/23/21   ?ECHOCARDIOGRAM COMPLETE  ?Result Value Ref Range  ? AR max vel 2.73 cm2  ? AV Peak grad 4.7 mmHg  ? Ao pk vel 1.08 m/s  ? S' Lateral 2.40 cm  ? Area-P 1/2 5.62 cm2  ? AV Area VTI 2.43 cm2  ? AV Mean grad 3.0 mmHg  ? Single Plane A4C EF 58.2 %  ? Single Plane A2C EF 67.5 %  ? Calc EF 63.2 %  ? AV Area mean vel 2.60 cm2  ? ?   ?Assessment & Plan:  ? ?Problem List Items Addressed This Visit   ? ?  ? Cardiovascular and Mediastinum  ? Coronary artery disease involving native coronary artery of native heart with unstable angina pectoris ([place]) - Primary  ?  Newly diagnosed by cardiology. Medications being adjusted. Continue to monitor. Call with any concerns.  ?  ?  ? ?Other Visit Diagnoses   ? ? Upper back pain      ? 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 Will treat with flexeril and voltaren. If not getting better, consider PT. Call with any concerns.   ? Relevant Medications  ? cyclobenzaprine (FLEXERIL) 10 MG tablet  ? ?  ?  ? ?Follow up plan: ?Return May follow up. ? ? ? ? ? ?

## 2021-07-25 ENCOUNTER — Encounter: Payer: Self-pay | Admitting: Internal Medicine

## 2021-07-26 NOTE — Assessment & Plan Note (Signed)
Newly diagnosed by cardiology. Medications being adjusted. Continue to monitor. Call with any concerns.  ?

## 2021-08-02 NOTE — Telephone Encounter (Signed)
In error

## 2021-08-17 ENCOUNTER — Encounter: Payer: Self-pay | Admitting: Cardiology

## 2021-08-17 ENCOUNTER — Ambulatory Visit (INDEPENDENT_AMBULATORY_CARE_PROVIDER_SITE_OTHER): Payer: BC Managed Care – PPO | Admitting: Cardiology

## 2021-08-17 VITALS — BP 112/78 | HR 109 | Ht 69.0 in | Wt 242.1 lb

## 2021-08-17 DIAGNOSIS — I2510 Atherosclerotic heart disease of native coronary artery without angina pectoris: Secondary | ICD-10-CM | POA: Diagnosis not present

## 2021-08-17 DIAGNOSIS — E782 Mixed hyperlipidemia: Secondary | ICD-10-CM | POA: Diagnosis not present

## 2021-08-17 NOTE — Progress Notes (Signed)
?Cardiology Office Note:   ? ?Date:  08/17/2021  ? ?ID:  Casey Cole, DOB 30-Aug-1975, MRN [id number] ? ?PCP:  Valerie Roys, DO ?  ?[facility] Providers ?Cardiologist:  Kate Sable, MD    ? ?Referring MD: Valerie Roys, DO  ? ?Chief Complaint  ?Patient presents with  ? Other  ?  1 month f/u post cath no complaints today. Pt refused EKG today. Meds reviewed verbally with pt.  ? ? ?History of Present Illness:   ? ?Casey Cole is a 46 y.o. male with a hx of CAD ([place] 07/2021 70 to 90% D1-too small for intervention, moderate LAD, OM 2, PDA disease), hyperlipidemia, diabetes, former smoker x20+ years, OSA who presents for follow-up.   ? ?Previously seen for chest pain, coronary CTA showed CAD, underwent left heart cath 07/2018 20 showing significant stenosis in the first diagonal which was a small size vessel.  Moderate disease noted in the LAD and right PDA.  Medication management advised.  Started on Lopressor 25 mg twice daily for antianginal benefit after heart cath.  Denies any further episodes of chest pain.  Tolerating Lipitor without any adverse effects. ? ?Prior notes ?LHC 07/2021 70 to 90% D1-2 small for intervention, moderate LAD, OM 2, PDA disease ?Echo 07/23/2021 EF 60 to 65%, impaired relaxation ?Coronary CTA 07/12/2021 calcium score 116, severe stenosis in PDA, severe stenosis OM 2, moderate LAD disease. ? ? ?Past Medical History:  ?Diagnosis Date  ? Abscess of superficial perineal space 08/23/2015  ? Depression 10/27/2015  ? Diabetes mellitus without complication ([place])   ? Hyperlipidemia   ? Hypertension   ? Recurrent major depressive disorder, in full remission ([place]) 10/26/2018  ? Sleep apnea   ? ? ?Past Surgical History:  ?Procedure Laterality Date  ? CHOLECYSTECTOMY    ? CORONARY PRESSURE WIRE/FFR WITH 3D MAPPING N/A 07/17/2021  ? Procedure: Coronary Pressure Wire/FFR w/3D Mapping;  Surgeon: Nelva Bush, MD;  Location: [facility];  Service: Cardiovascular;  Laterality: N/A;  ?  INCISE AND DRAIN ABCESS Left 07-12-15  ? left buttock  ? INCISION AND DRAINAGE PERIRECTAL ABSCESS N/A 08/23/2015  ? Procedure: IRRIGATION AND DEBRIDEMENT PERIRECTAL ABSCESS;  Surgeon: Robert Bellow, MD;  Location: [facility];  Service: General;  Laterality: N/A;  Anal Scope  ? LEFT HEART CATH AND CORONARY ANGIOGRAPHY Left 07/17/2021  ? Procedure: LEFT HEART CATH AND CORONARY ANGIOGRAPHY;  Surgeon: Nelva Bush, MD;  Location: [facility];  Service: Cardiovascular;  Laterality: Left;  ? VASECTOMY    ? ? ?Current Medications: ?Current Meds  ?Medication Sig  ? aspirin EC 81 MG tablet Take 1 tablet (81 mg total) by mouth daily. Swallow whole.  ? atorvastatin (LIPITOR) 40 MG tablet Take 1 tablet (40 mg total) by mouth every other day.  ? cyclobenzaprine (FLEXERIL) 10 MG tablet Take 1 tablet (10 mg total) by mouth at bedtime.  ? diclofenac Sodium (VOLTAREN) 1 % GEL Apply 4 g topically 4 (four) times daily.  ? Guselkumab (TREMFYA [place]) Inject 1 Dose into the skin as directed. Every 8 weeks after starter doses.  ? metformin (FORTAMET) 1000 MG (OSM) 24 hr tablet Take 1 tablet (1,000 mg total) by mouth 2 (two) times daily with a meal.  ? metoprolol tartrate (LOPRESSOR) 25 MG tablet Take 1 tablet (25 mg total) by mouth 2 (two) times daily.  ? nitroGLYCERIN (NITROSTAT) 0.4 MG SL tablet Place 1 tablet (0.4 mg total) under the tongue every 5 (five) minutes as needed  for chest pain. For a maximum dose of 3 tablets.  ? ONE TOUCH ULTRA TEST test strip USE AS DIRECTED ONCE DAILY  ? Semaglutide, 1 MG/DOSE, (OZEMPIC, 1 MG/DOSE,) 2 MG/1.5ML SOPN Inject 1 mg into the skin once a week.  ?  ? ?Allergies:   Methylprednisolone  ? ?Social History  ? ?Socioeconomic History  ? Marital status: Married  ?  Spouse name: Not on file  ? Number of children: Not on file  ? Years of education: Not on file  ? Highest education level: Not on file  ?Occupational History  ? Not on file  ?Tobacco Use  ? Smoking status: Former  ?  Packs/day: 1.00  ?   Types: Cigarettes  ?  Quit date: 07/14/2021  ?  Years since quitting: 0.0  ? Smokeless tobacco: Never  ?Vaping Use  ? Vaping Use: Never used  ?Substance and Sexual Activity  ? Alcohol use: Not Currently  ? Drug use: No  ? Sexual activity: Yes  ?Other Topics Concern  ? Not on file  ?Social History Narrative  ? Not on file  ? ?Social Determinants of Health  ? ?Financial Resource Strain: Not on file  ?Food Insecurity: Not on file  ?Transportation Needs: Not on file  ?Physical Activity: Not on file  ?Stress: Not on file  ?Social Connections: Not on file  ?  ? ?Family History: ?The patient's family history includes Heart attack in his paternal grandfather; Hypertension in his father; Stroke in his paternal grandfather. ? ?ROS:   ?Please see the history of present illness.    ? All other systems reviewed and are negative. ? ?EKGs/Labs/Other Studies Reviewed:   ? ?The following studies were reviewed today: ? ? ?EKG:  EKG not ordered today.   ? ?Recent Labs: ?01/01/2021: TSH 0.801 ?06/21/2021: ALT 17 ?07/16/2021: BUN 10; Creatinine, Ser 1.16; Hemoglobin 17.1; Platelets 219; Potassium 4.1; Sodium 142  ?Recent Lipid Panel ?   ?Component Value Date/Time  ? CHOL 156 03/26/2021 1553  ? TRIG 241 (H) 03/26/2021 1553  ? HDL 32 (L) 03/26/2021 1553  ? [place] 84 03/26/2021 1553  ? ? ? ?Risk Assessment/Calculations:   ? ? ?    ? ?Physical Exam:   ? ?VS:  BP 112/78 (BP Location: Left Arm, Patient Position: Sitting, Cuff Size: Normal)   Pulse (!) 109   Ht '5\' 9"'$  (1.753 m)   Wt 242 lb 2 oz (109.8 kg)   SpO2 97%   BMI 35.76 kg/m?    ? ?Wt Readings from Last 3 Encounters:  ?08/17/21 242 lb 2 oz (109.8 kg)  ?07/24/21 234 lb (106.1 kg)  ?07/17/21 232 lb (105.2 kg)  ?  ? ?GEN:  Well nourished, well developed in no acute distress ?HEENT: Normal ?NECK: No JVD; No carotid bruits ?LYMPHATICS: No lymphadenopathy ?CARDIAC: RRR, no murmurs, rubs, gallops ?RESPIRATORY:  Clear to auscultation without rales, wheezing or rhonchi  ?ABDOMEN: Soft,  non-tender, non-distended ?MUSCULOSKELETAL:  No edema; No deformity  ?SKIN: Warm and dry ?NEUROLOGIC:  Alert and oriented x 3 ?PSYCHIATRIC:  Normal affect  ? ?ASSESSMENT:   ? ?1. Coronary artery disease involving native coronary artery of native heart, unspecified whether angina present   ?2. Mixed hyperlipidemia   ? ? ? ?PLAN:   ? ?In order of problems listed above: ? ?CAD, LHC 07/2021 70 to 90% D1-2 small for intervention, moderate LAD, OM 2, PDA disease.  Echocardiogram 60 to 65%.  Denies chest pain.  Continue aspirin 81 mg, Lopressor 25 mg  twice daily, Lipitor to 40 mg daily. ?Hyperlipidemia, Lipitor 40 mg daily.  Check lipid panel in 3 months. ? ?Follow-up in 6 months. ? ?   ? ?Medication Adjustments/Labs and Tests Ordered: ?Current medicines are reviewed at length with the patient today.  Concerns regarding medicines are outlined above.  ?Orders Placed This Encounter  ?Procedures  ? Lipid panel  ? ?No orders of the defined types were placed in this encounter. ? ? ?Patient Instructions  ?Medication Instructions:  ? ?Your physician recommends that you continue on your current medications as directed. Please refer to the Current Medication list given to you today. ? ?*If you need a refill on your cardiac medications before your next appointment, please call your pharmacy* ? ? ?Lab Work: ? ?Your physician recommends that you return for a FASTING lipid profile: In 3 months ? ?- You will need to be fasting. Please do not have anything to eat or drink after midnight the morning you have the lab work. You may only have water or black coffee with no cream or sugar.  ? ?We will call you in 3 months to schedule you in our office for this lab draw. ? ? ? ?Testing/Procedures: ?None ordered ? ? ?Follow-Up: ?At [facility], you and your health needs are our priority.  As part of our continuing mission to provide you with exceptional heart care, we have created designated Provider Care Teams.  These Care Teams include your  primary Cardiologist (physician) and Advanced Practice Providers (APPs -  Physician Assistants and Nurse Practitioners) who all work together to provide you with the care you need, when you need it. ? ?We recommend

## 2021-08-17 NOTE — Patient Instructions (Signed)
Medication Instructions:  ? ?Your physician recommends that you continue on your current medications as directed. Please refer to the Current Medication list given to you today. ? ?*If you need a refill on your cardiac medications before your next appointment, please call your pharmacy* ? ? ?Lab Work: ? ?Your physician recommends that you return for a FASTING lipid profile: In 3 months ? ?- You will need to be fasting. Please do not have anything to eat or drink after midnight the morning you have the lab work. You may only have water or black coffee with no cream or sugar.  ? ?We will call you in 3 months to schedule you in our office for this lab draw. ? ? ? ?Testing/Procedures: ?None ordered ? ? ?Follow-Up: ?At [facility], you and your health needs are our priority.  As part of our continuing mission to provide you with exceptional heart care, we have created designated Provider Care Teams.  These Care Teams include your primary Cardiologist (physician) and Advanced Practice Providers (APPs -  Physician Assistants and Nurse Practitioners) who all work together to provide you with the care you need, when you need it. ? ?We recommend signing up for the patient portal called "MyChart".  Sign up information is provided on this After Visit Summary.  MyChart is used to connect with patients for Virtual Visits (Telemedicine).  Patients are able to view lab/test results, encounter notes, upcoming appointments, etc.  Non-urgent messages can be sent to your provider as well.   ?To learn more about what you can do with MyChart, go to [email or web address].   ? ?Your next appointment:   ?6 month(s) ? ?The format for your next appointment:   ?In Person ? ?Provider:   ?You may see Kate Sable, MD or one of the following Advanced Practice Providers on your designated Care Team:   ?Murray Hodgkins, NP ?Christell Faith, PA-C ?Cadence Kathlen Mody, PA-C  ? ? ?Other Instructions ? ? ?Important Information About Sugar ? ? ? ? ? ? ?

## 2021-08-21 ENCOUNTER — Ambulatory Visit: Payer: BC Managed Care – PPO | Admitting: Family Medicine

## 2021-08-21 ENCOUNTER — Encounter: Payer: Self-pay | Admitting: Family Medicine

## 2021-08-21 VITALS — BP 113/78 | HR 89 | Temp 97.9°F | Wt 239.6 lb

## 2021-08-21 DIAGNOSIS — L03317 Cellulitis of buttock: Secondary | ICD-10-CM

## 2021-08-21 DIAGNOSIS — K219 Gastro-esophageal reflux disease without esophagitis: Secondary | ICD-10-CM

## 2021-08-21 MED ORDER — OMEPRAZOLE 20 MG PO CPDR: 20.0000 mg | DELAYED_RELEASE_CAPSULE | Freq: Every day | ORAL | 3 refills | Status: DC

## 2021-08-21 MED ORDER — SULFAMETHOXAZOLE-TRIMETHOPRIM 800-160 MG PO TABS: 1.0000 | ORAL_TABLET | Freq: Two times a day (BID) | ORAL | 0 refills | Status: DC

## 2021-08-21 NOTE — Progress Notes (Signed)
? ?BP 113/78   Pulse 89   Temp 97.9 ?F (36.6 ?C)   Wt 239 lb 9.6 oz (108.7 kg)   SpO2 98%   BMI 35.38 kg/m?   ? ?Subjective:  ? ? Patient ID: Casey Cole, male    DOB: [birth year], 46 y.o.   MRN: [id number] ? ?HPI: ?Casey Cole is a 46 y.o. male ? ?Chief Complaint  ?Patient presents with  ? GI Problem  ?  Patient states he has been burping more than usual and it has a sulfur taste  ? Cyst  ?  Patient states he feels like he has an abscess starting on his left buttock.   ? ?GERD ?GERD control status: uncontrolled ?Satisfied with current treatment? no ?Heartburn frequency: every 3-4 days ?Medication side effects: not on anything  ?Medication compliance: not on anything ?Previous GERD medications: pepto bismol ?Antacid use frequency:  PRN ?Duration: about 2 weeks ?Nature: sulfur burps and burning ?Location: chest  ?Dysphagia: no ?Odynophagia:  no ?Hematemesis: no ?Blood in stool: no ?EGD: no ? ?SKIN INFECTION ?Duration: couple of weeks ?Location: L buttock ?History of trauma in area: no ?Pain: no ?Quality: tingling and itching ?Severity: mild ?Redness: yes ?Swelling: yes ?Oozing: no ?Pus: no ?Fevers: no ?Nausea/vomiting: no ?Status: worse ?Treatments attempted:warm compresses  ?Tetanus: UTD ? ? ?Relevant past medical, surgical, family and social history reviewed and updated as indicated. Interim medical history since our last visit reviewed. ?Allergies and medications reviewed and updated. ? ?Review of Systems  ?Constitutional: Negative.   ?Respiratory: Negative.    ?Cardiovascular: Negative.   ?Gastrointestinal:  Positive for abdominal distention and abdominal pain. Negative for anal bleeding, blood in stool, constipation, diarrhea, nausea, rectal pain and vomiting.  ?Musculoskeletal: Negative.   ?Skin:  Positive for color change. Negative for pallor, rash and wound.  ?Psychiatric/Behavioral: Negative.    ? ?Per HPI unless specifically indicated above ? ?   ?Objective:  ?  ?BP 113/78   Pulse 89   Temp 97.9 ?F  (36.6 ?C)   Wt 239 lb 9.6 oz (108.7 kg)   SpO2 98%   BMI 35.38 kg/m?   ?Wt Readings from Last 3 Encounters:  ?08/21/21 239 lb 9.6 oz (108.7 kg)  ?08/17/21 242 lb 2 oz (109.8 kg)  ?07/24/21 234 lb (106.1 kg)  ?  ?Physical Exam ?Vitals and nursing note reviewed.  ?Constitutional:   ?   General: He is not in acute distress. ?   Appearance: Normal appearance. He is not ill-appearing, toxic-appearing or diaphoretic.  ?HENT:  ?   Head: Normocephalic and atraumatic.  ?   Right Ear: External ear normal.  ?   Left Ear: External ear normal.  ?   Nose: Nose normal.  ?   Mouth/Throat:  ?   Mouth: Mucous membranes are moist.  ?   Pharynx: Oropharynx is clear.  ?Eyes:  ?   General: No scleral icterus.    ?   Right eye: No discharge.     ?   Left eye: No discharge.  ?   Extraocular Movements: Extraocular movements intact.  ?   Conjunctiva/sclera: Conjunctivae normal.  ?   Pupils: Pupils are equal, round, and reactive to light.  ?Cardiovascular:  ?   Rate and Rhythm: Normal rate and regular rhythm.  ?   Pulses: Normal pulses.  ?   Heart sounds: Normal heart sounds. No murmur heard. ?  No friction rub. No gallop.  ?Pulmonary:  ?   Effort: Pulmonary effort is normal. No respiratory distress.  ?  Breath sounds: Normal breath sounds. No stridor. No wheezing, rhonchi or rales.  ?Chest:  ?   Chest wall: No tenderness.  ?Genitourinary: ? ? ?Musculoskeletal:     ?   General: Normal range of motion.  ?   Cervical back: Normal range of motion and neck supple.  ?Skin: ?   General: Skin is warm and dry.  ?   Capillary Refill: Capillary refill takes less than 2 seconds.  ?   Coloration: Skin is not jaundiced or pale.  ?   Findings: No bruising, erythema, lesion or rash.  ?Neurological:  ?   General: No focal deficit present.  ?   Mental Status: He is alert and oriented to person, place, and time. Mental status is at baseline.  ?Psychiatric:     ?   Mood and Affect: Mood normal.     ?   Behavior: Behavior normal.     ?   Thought Content:  Thought content normal.     ?   Judgment: Judgment normal.  ? ? ? ?Results for orders placed or performed in visit on 07/23/21  ?ECHOCARDIOGRAM COMPLETE  ?Result Value Ref Range  ? AR max vel 2.73 cm2  ? AV Peak grad 4.7 mmHg  ? Ao pk vel 1.08 m/s  ? S' Lateral 2.40 cm  ? Area-P 1/2 5.62 cm2  ? AV Area VTI 2.43 cm2  ? AV Mean grad 3.0 mmHg  ? Single Plane A4C EF 58.2 %  ? Single Plane A2C EF 67.5 %  ? Calc EF 63.2 %  ? AV Area mean vel 2.60 cm2  ? ?   ?Assessment & Plan:  ? ?Problem List Items Addressed This Visit   ?None ?Visit Diagnoses   ? ? Gastroesophageal reflux disease, unspecified whether esophagitis present    -  Primary  ? Will treat with omeprazole. Call if not getting better or getting worse.   ? Relevant Medications  ? omeprazole (PRILOSEC) 20 MG capsule  ? Cellulitis of left buttock      ? Not fluctuant. Will treat with bactrim. Call with any concerns or if not getting better. Continue to monitor.   ? ?  ?  ? ?Follow up plan: ?Return As scheduled. ? ? ? ? ? ?

## 2021-08-23 ENCOUNTER — Encounter: Payer: Self-pay | Admitting: Neurology

## 2021-08-23 ENCOUNTER — Ambulatory Visit: Payer: BC Managed Care – PPO | Admitting: Neurology

## 2021-08-23 VITALS — BP 119/81 | HR 94 | Ht 69.0 in | Wt 242.5 lb

## 2021-08-23 DIAGNOSIS — I2510 Atherosclerotic heart disease of native coronary artery without angina pectoris: Secondary | ICD-10-CM | POA: Insufficient documentation

## 2021-08-23 DIAGNOSIS — R61 Generalized hyperhidrosis: Secondary | ICD-10-CM

## 2021-08-23 DIAGNOSIS — E66812 Obesity, class 2: Secondary | ICD-10-CM

## 2021-08-23 DIAGNOSIS — Z6835 Body mass index (BMI) 35.0-35.9, adult: Secondary | ICD-10-CM

## 2021-08-23 DIAGNOSIS — R0681 Apnea, not elsewhere classified: Secondary | ICD-10-CM

## 2021-08-23 DIAGNOSIS — G478 Other sleep disorders: Secondary | ICD-10-CM

## 2021-08-23 DIAGNOSIS — I25118 Atherosclerotic heart disease of native coronary artery with other forms of angina pectoris: Secondary | ICD-10-CM | POA: Diagnosis not present

## 2021-08-23 DIAGNOSIS — R0683 Snoring: Secondary | ICD-10-CM

## 2021-08-23 DIAGNOSIS — G4719 Other hypersomnia: Secondary | ICD-10-CM

## 2021-08-23 MED ORDER — MOMETASONE FUROATE 50 MCG/ACT NA SUSP: 2.0000 | Freq: Every day | NASAL | 12 refills | Status: DC

## 2021-08-23 NOTE — Progress Notes (Signed)
? ? ?SLEEP MEDICINE CLINIC ?  ? ?Provider:  Larey Seat, MD  ?Primary Care Physician:  Valerie Roys, DO ?[place] ?[place] [address]  ? ?  ?Referring Provider: Valerie Roys, Do ?[address] [place],  [place] [address]  ?  ?  ?    ?Chief Complaint according to patient   ?Patient presents with:  ?  ? New Patient (Initial Visit)  ?     ?  ?  ?HISTORY OF PRESENT ILLNESS:  ?Casey Cole is a 46 y.o.  Caucasian male patient seen here as a referral on 08/23/2021 from his PCP.  ?Chief concern according to patient :  Internal referral for OSA. Did not tolerate CPAP. Last PSG at [facility] 4[facility]5 yr ago at [facility]. Has CPAP but last used 2 yr ago (resmed). He bought that one off [email or web address] with a prescription for 18 cm water, no RAMP/  ?He had  a recent ED visit with chest pain and diagnosed with hypercholesterolemia, tachycardia, and was treated for pneumonia after chest XRay- 06-2021, cardiologist Dr. Garen Lah, diagnosed CAD.  ?Casey Cole  has a past medical history of Abscess of superficial perineal space (08/23/2015), Depression (10/27/2015), Diabetes mellitus without complication ([place]), Hyperlipidemia, Hypertension, Recurrent major depressive disorder, in full remission ([place]) (10/26/2018),  GERD, and Sleep apnea. ?  ?The patient had the first sleep study in the year 2016/ 17-  ?  ?Sleep relevant medical history: congestion, seasonal rhinitis, Nocturia 2-3 , wisdom teeth removed, septum nasi deviation-  fractured.  ?Family medical /sleep history: no other family member on CPAP with OSA,  father was a snorer,  ? ?Social history:  Patient is working as a Engineer, agricultural. Walks a lot in his job.  and lives in [place] a household with spouse and son , one dog.  ?Tobacco use recently quit. ? ETOH use rare since 6 months ago,  ?Caffeine intake in form of Coffee( /) Soda( 2-3 a day) Tea ( /) or energy drinks. ? ?  ?  ?Sleep habits are as follows: The patient's dinner time is between 6 PM. The  patient goes to bed at 10-11 PM and continues to sleep for 3-4 hours, wakes for  bathroom breaks, the first time at 1-2 AM.  After that he has shorter and shorter intervals of sleep. Sweating a lot , head sweating.  ?The preferred sleep position is let side , with the support of 2 pillows. Adjusted bed- 8 to 12 degrees ?Dreams are reportedly rare but supine vivid.  ?6.30  AM is the usual rise time. The patient wakes up with an alarm. Weekend and week day at latest up at 7 AM.  ?He reports not feeling refreshed or restored in AM, with symptoms such as dry mouth, sweat, morning headaches, and residual fatigue.  ?Naps are taken infrequently,  but since March he became more sleepy-lasting from 10-25 minutes and are more refreshing than nocturnal sleep.  ?  ?Review of Systems: ?Out of a complete 14 system review, the patient complains of only the following symptoms, and all other reviewed systems are negative.:  ?Fatigue, sleepiness , snoring, fragmented sleep, nocturia, palpitations, and diaphoresis,  ?  ?How likely are you to doze in the following situations: ?0 = not likely, 1 = slight chance, 2 = moderate chance, 3 = high chance ?  ?Sitting and Reading? 1 ?Watching Television? 3 ?Sitting inactive in a public place (theater or meeting)?2 ?As a passenger in a car  for an hour without a break?2 ?Lying down in the afternoon when circumstances permit?3 ?Sitting and talking to someone?1 ?Sitting quietly after lunch without alcohol?3 ?In a car, while stopped for a few minutes in traffic?0 ?  ?Total = 15/ 24 points  ? FSS endorsed at na/ 63 points.  ? ?Social History  ? ?Socioeconomic History  ? Marital status: Married  ?  Spouse name: Not on file  ? Number of children: Not on file  ? Years of education: Not on file  ? Highest education level: Not on file  ?Occupational History  ? Not on file  ?Tobacco Use  ? Smoking status: Former  ?  Packs/day: 1.00  ?  Types: Cigarettes  ?  Quit date: 07/14/2021  ?  Years since quitting:  0.1  ? Smokeless tobacco: Never  ?Vaping Use  ? Vaping Use: Never used  ?Substance and Sexual Activity  ? Alcohol use: Not Currently  ? Drug use: No  ? Sexual activity: Yes  ?Other Topics Concern  ? Not on file  ?Social History Narrative  ? Caffeine use: 2-3 per day (soda)  ? ?Social Determinants of Health  ? ?Financial Resource Strain: Not on file  ?Food Insecurity: Not on file  ?Transportation Needs: Not on file  ?Physical Activity: Not on file  ?Stress: Not on file  ?Social Connections: Not on file  ? ? ?Family History  ?Problem Relation Age of Onset  ? Hypertension Father   ? Stroke Paternal Grandfather   ? Heart attack Paternal Grandfather   ? ? ?Past Medical History:  ?Diagnosis Date  ? Abscess of superficial perineal space 08/23/2015  ? Depression 10/27/2015  ? Diabetes mellitus without complication ([place])   ? Hyperlipidemia   ? Hypertension   ? Recurrent major depressive disorder, in full remission ([place]) 10/26/2018  ? Sleep apnea   ? ? ?Past Surgical History:  ?Procedure Laterality Date  ? CHOLECYSTECTOMY    ? CORONARY PRESSURE WIRE/FFR WITH 3D MAPPING N/A 07/17/2021  ? Procedure: Coronary Pressure Wire/FFR w/3D Mapping;  Surgeon: Nelva Bush, MD;  Location: [facility];  Service: Cardiovascular;  Laterality: N/A;  ? INCISE AND DRAIN ABCESS Left 07-12-15  ? left buttock  ? INCISION AND DRAINAGE PERIRECTAL ABSCESS N/A 08/23/2015  ? Procedure: IRRIGATION AND DEBRIDEMENT PERIRECTAL ABSCESS;  Surgeon: Robert Bellow, MD;  Location: [facility];  Service: General;  Laterality: N/A;  Anal Scope  ? LEFT HEART CATH AND CORONARY ANGIOGRAPHY Left 07/17/2021  ? Procedure: LEFT HEART CATH AND CORONARY ANGIOGRAPHY;  Surgeon: Nelva Bush, MD;  Location: [facility];  Service: Cardiovascular;  Laterality: Left;  ? VASECTOMY    ?  ? ?Current Outpatient Medications on File Prior to Visit  ?Medication Sig Dispense Refill  ? aspirin EC 81 MG tablet Take 1 tablet (81 mg total) by mouth daily. Swallow whole.  90 tablet 3  ? atorvastatin (LIPITOR) 40 MG tablet Take 1 tablet (40 mg total) by mouth every other day. 90 tablet 1  ? cyclobenzaprine (FLEXERIL) 10 MG tablet Take 1 tablet (10 mg total) by mouth at bedtime. 90 tablet 1  ? diclofenac Sodium (VOLTAREN) 1 % GEL Apply 4 g topically 4 (four) times daily. 350 g 3  ? Guselkumab (TREMFYA [place]) Inject 1 Dose into the skin as directed. Every 8 weeks after starter doses.    ? metformin (FORTAMET) 1000 MG (OSM) 24 hr tablet Take 1 tablet (1,000 mg total) by mouth 2 (two) times daily with a  meal. 180 tablet 1  ? metoprolol tartrate (LOPRESSOR) 25 MG tablet Take 1 tablet (25 mg total) by mouth 2 (two) times daily. 60 tablet 5  ? nitroGLYCERIN (NITROSTAT) 0.4 MG SL tablet Place 1 tablet (0.4 mg total) under the tongue every 5 (five) minutes as needed for chest pain. For a maximum dose of 3 tablets. 30 tablet 1  ? omeprazole (PRILOSEC) 20 MG capsule Take 1 capsule (20 mg total) by mouth daily. 30 capsule 3  ? ONE TOUCH ULTRA TEST test strip USE AS DIRECTED ONCE DAILY 100 each 0  ? Semaglutide, 1 MG/DOSE, (OZEMPIC, 1 MG/DOSE,) 2 MG/1.5ML SOPN Inject 1 mg into the skin once a week. 18 mL 1  ? sulfamethoxazole-trimethoprim (BACTRIM DS) 800-160 MG tablet Take 1 tablet by mouth 2 (two) times daily. 20 tablet 0  ? ?No current facility-administered medications on file prior to visit.  ? ? ?Allergies  ?Allergen Reactions  ? Methylprednisolone   ?  Patient states caused dark urine, chills, and tongue felt burnt   ? ? ?Physical exam: ? ?Today's Vitals  ? 08/23/21 1439  ?BP: 119/81  ?Pulse: 94  ?SpO2: 95%  ?Weight: 242 lb 8 oz (110 kg)  ?Height: '5\' 9"'$  (1.753 m)  ? ?Body mass index is 35.81 kg/m?.  ? ?Wt Readings from Last 3 Encounters:  ?08/23/21 242 lb 8 oz (110 kg)  ?08/21/21 239 lb 9.6 oz (108.7 kg)  ?08/17/21 242 lb 2 oz (109.8 kg)  ?  ? ?Ht Readings from Last 3 Encounters:  ?08/23/21 '5\' 9"'$  (1.753 m)  ?08/17/21 '5\' 9"'$  (1.753 m)  ?07/17/21 '5\' 8"'$  (1.727 m)  ?  ?  ?General: The patient is  awake, alert and appears not in acute distress. The patient is well groomed. ?Head: Normocephalic, atraumatic. Neck is supple.  ?Mallampati  3 with lateral pilars ,  ?neck circumference:19 inches . Nasal ai

## 2021-08-23 NOTE — Patient Instructions (Signed)

## 2021-09-11 ENCOUNTER — Telehealth: Payer: Self-pay

## 2021-09-11 NOTE — Telephone Encounter (Signed)
Pt will call back to schedule their sleep study at a better time ?

## 2021-09-12 ENCOUNTER — Ambulatory Visit: Payer: Self-pay | Admitting: *Deleted

## 2021-09-12 NOTE — Telephone Encounter (Signed)
?  Pt reports niece  was killed in [place] last Friday. States has been dealing with grief and needs something to help him get through this. Requesting med be called in or E visit with Dr. Wynetta Emery. Pt tearful during call. Triage incomplete. ?Called practice, [facility], for consult. Will route to practice for PCPs review. ? ? ? ?Reason for Disposition ? Symptoms interfere with work or school ? ?Answer Assessment - Initial Assessment Questions ?1. CONCERN: "What happened that made you call today?" ?    Niece killed in [place] ?2. DEPRESSION SYMPTOM SCREENING: "How are you feeling overall?" (e.g., decreased energy, increased sleeping or difficulty sleeping, difficulty concentrating, feelings of sadness, guilt, hopelessness, or worthlessness) ?    *No Answer* ?3. RISK OF HARM - SUICIDAL IDEATION:  "Do you ever have thoughts of hurting or killing yourself?"  (e.g., yes, no, no but preoccupation with thoughts about death) ?  - INTENT:  "Do you have thoughts of hurting or killing yourself right NOW?" (e.g., yes, no, N/A) ?  - PLAN: "Do you have a specific plan for how you would do this?" (e.g., gun, knife, overdose, no plan, N/A) ?    *No Answer* ?4. RISK OF HARM - HOMICIDAL IDEATION:  "Do you ever have thoughts of hurting or killing someone else?"  (e.g., yes, no, no but preoccupation with thoughts about death) ?  - INTENT:  "Do you have thoughts of hurting or killing someone right NOW?" (e.g., yes, no, N/A) ?  - PLAN: "Do you have a specific plan for how you would do this?" (e.g., gun, knife, no plan, N/A)  ?    *No Answer* ?5. FUNCTIONAL IMPAIRMENT: "How have things been going for you overall? Have you had more difficulty than usual doing your normal daily activities?"  (e.g., better, same, worse; self-care, school, work, interactions) ?    *No Answer* ?6. SUPPORT: "Who is with you now?" "Who do you live with?" "Do you have family or friends who you can talk to?"  ?    *No Answer* ?7. THERAPIST: "Do you have a counselor or therapist?  Name?" ?    *No Answer* ?8. STRESSORS: "Has there been any new stress or recent changes in your life?" ?    *No Answer* ?9. ALCOHOL USE OR SUBSTANCE USE (DRUG USE): "Do you drink alcohol or use any illegal drugs?" ?    *No Answer* ?10. OTHER: "Do you have any other physical symptoms right now?" (e.g., fever) ?      *No Answer* ?11. PREGNANCY: "Is there any chance you are pregnant?" "When was your last menstrual period?" ?      *No Answer* ? ?Protocols used: [place] ? ?

## 2021-09-13 MED ORDER — LORAZEPAM 0.5 MG PO TABS: 0.5000 mg | ORAL_TABLET | Freq: Two times a day (BID) | ORAL | 1 refills | Status: DC | PRN

## 2021-09-13 NOTE — Addendum Note (Signed)
Addended by: Valerie Roys on: 09/13/2021 09:29 AM ? ? Modules accepted: Orders ? ?

## 2021-09-14 ENCOUNTER — Encounter: Payer: Self-pay | Admitting: Cardiology

## 2021-09-14 ENCOUNTER — Other Ambulatory Visit: Payer: Self-pay

## 2021-09-14 MED ORDER — ATORVASTATIN CALCIUM 40 MG PO TABS: 40.0000 mg | ORAL_TABLET | Freq: Every day | ORAL | 1 refills | Status: DC

## 2021-09-17 ENCOUNTER — Encounter: Payer: Self-pay | Admitting: Family Medicine

## 2021-09-17 ENCOUNTER — Ambulatory Visit (INDEPENDENT_AMBULATORY_CARE_PROVIDER_SITE_OTHER): Payer: BC Managed Care – PPO | Admitting: Family Medicine

## 2021-09-17 VITALS — BP 119/76 | HR 93 | Temp 98.1°F | Wt 241.2 lb

## 2021-09-17 DIAGNOSIS — I129 Hypertensive chronic kidney disease with stage 1 through stage 4 chronic kidney disease, or unspecified chronic kidney disease: Secondary | ICD-10-CM | POA: Diagnosis not present

## 2021-09-17 DIAGNOSIS — R809 Proteinuria, unspecified: Secondary | ICD-10-CM | POA: Diagnosis not present

## 2021-09-17 DIAGNOSIS — E782 Mixed hyperlipidemia: Secondary | ICD-10-CM

## 2021-09-17 DIAGNOSIS — F419 Anxiety disorder, unspecified: Secondary | ICD-10-CM | POA: Diagnosis not present

## 2021-09-17 DIAGNOSIS — E1129 Type 2 diabetes mellitus with other diabetic kidney complication: Secondary | ICD-10-CM | POA: Diagnosis not present

## 2021-09-17 LAB — BAYER DCA HB A1C WAIVED: HB A1C (BAYER DCA - WAIVED): 6.5 % — ABNORMAL HIGH (ref 4.8–5.6)

## 2021-09-17 LAB — MICROALBUMIN, URINE WAIVED
Creatinine, Urine Waived: 200 mg/dL (ref 10–300)
Microalb, Ur Waived: 10 mg/L (ref 0–19)
Microalb/Creat Ratio: <30 mg/g (ref <30)

## 2021-09-17 MED ORDER — METOPROLOL TARTRATE 25 MG PO TABS
25.0000 mg | ORAL_TABLET | Freq: Two times a day (BID) | ORAL | 1 refills | Status: DC
Start: 2021-09-17 — End: 2022-03-05

## 2021-09-17 MED ORDER — OMEPRAZOLE 20 MG PO CPDR: 20.0000 mg | DELAYED_RELEASE_CAPSULE | Freq: Every day | ORAL | 3 refills | Status: DC

## 2021-09-17 MED ORDER — OZEMPIC (1 MG/DOSE) 2 MG/1.5ML ~~LOC~~ SOPN: 1.0000 mg | PEN_INJECTOR | SUBCUTANEOUS | 1 refills | Status: DC

## 2021-09-17 MED ORDER — METFORMIN HCL ER (OSM) 1000 MG PO TB24: 1000.0000 mg | ORAL_TABLET | Freq: Two times a day (BID) | ORAL | 1 refills | Status: DC

## 2021-09-17 NOTE — Assessment & Plan Note (Signed)
Under good control on current regimen. Continue current regimen. Continue to monitor. Call with any concerns. Refills given. Labs drawn today.   

## 2021-09-17 NOTE — Assessment & Plan Note (Signed)
Doing well with A1c of 6.5. Continue diet and exercise. Call with any concerns. Continue to monitor. Call with any concerns.  ?

## 2021-09-17 NOTE — Progress Notes (Signed)
? ?BP 119/76   Pulse 93   Temp 98.1 ?F (36.7 ?C)   Wt 241 lb 3.2 oz (109.4 kg)   SpO2 95%   BMI 35.62 kg/m?   ? ?Subjective:  ? ? Patient ID: Casey Cole, male    DOB: 1976-03-23, 46 y.o.   MRN: [id number] ? ?HPI: ?Casey Cole is a 46 y.o. male ? ?Chief Complaint  ?Patient presents with  ? Diabetes  ?  Patient has not had eye exam this year   ? ?ANXIETY- just lost his niece last week. Has been planning her funeral  ?Duration: new onset ?Status:new onset ?Anxious mood: yes  ?Excessive worrying: no ?Irritability: yes  ?Sweating: no ?Nausea: no ?Palpitations:no ?Hyperventilation: no ?Panic attacks: no ?Agoraphobia: no  ?Obscessions/compulsions: no ?Depressed mood: yes ? ?  06/11/2021  ?  4:11 PM 01/01/2021  ?  3:42 PM 07/03/2020  ?  4:02 PM 03/24/2020  ?  3:38 PM 09/20/2019  ?  9:29 AM  ?Depression screen PHQ 2/9  ?Decreased Interest 1 0 0 0 0  ?Down, Depressed, Hopeless 1 0 0 0 0  ?PHQ - 2 Score 2 0 0 0 0  ?Altered sleeping 2 0 0 0 0  ?Tired, decreased energy 2 0 0 0 0  ?Change in appetite 3 0 0 0 0  ?Feeling bad or failure about yourself  0 0 0 0 0  ?Trouble concentrating 2 0 0 0 0  ?Moving slowly or fidgety/restless 0 0 0 0 0  ?Suicidal thoughts 0 0 0 0 0  ?PHQ-9 Score 11 0 0 0 0  ?Difficult doing work/chores  Not difficult at all Not difficult at all  Not difficult at all  ? ?Anhedonia: no ?Weight changes: no ?Insomnia: no   ?Hypersomnia: no ?Fatigue/loss of energy: no ?Feelings of worthlessness: no ?Feelings of guilt: no ?Impaired concentration/indecisiveness: no ?Suicidal ideations: no  ?Crying spells: no ?Recent Stressors/Life Changes: no ?  Relationship problems: no ?  Family stress: no   ?  Financial stress: no  ?  Job stress: no  ?  Recent death/loss: no ? ?DIABETES ?Hypoglycemic episodes:no ?Polydipsia/polyuria: no ?Visual disturbance: no ?Chest pain: no ?Paresthesias: no ?Glucose Monitoring: yes ? Accucheck frequency: Not Checking ?Taking Insulin?: no ?Blood Pressure Monitoring: a few times a week ?Retinal  Examination: Not up to Date ?Foot Exam: Up to Date ?Diabetic Education: Completed ?Pneumovax: Up to Date ?Influenza: Up to Date ?Aspirin: yes ? ?HYPERTENSION / HYPERLIPIDEMIA ?Satisfied with current treatment? no ?Duration of hypertension: years ?BP monitoring frequency: not checking ?BP medication side effects: no ?Past BP meds: metoprolol ?Duration of hyperlipidemia: chronic ?Cholesterol medication side effects: no ?Cholesterol supplements: none ?Past cholesterol medications: atorvastatin ?Medication compliance: excellent compliance ?Aspirin: no ?Recent stressors: yes ?Recurrent headaches: no ?Visual changes: no ?Palpitations: no ?Dyspnea: no ?Chest pain: no ?Lower extremity edema: no ?Dizzy/lightheaded: no ? ? ?Relevant past medical, surgical, family and social history reviewed and updated as indicated. Interim medical history since our last visit reviewed. ?Allergies and medications reviewed and updated. ? ?Review of Systems  ?Constitutional: Negative.   ?Respiratory: Negative.    ?Cardiovascular: Negative.   ?Musculoskeletal: Negative.   ?Neurological: Negative.   ?Psychiatric/Behavioral: Negative.    ? ?Per HPI unless specifically indicated above ? ?   ?Objective:  ?  ?BP 119/76   Pulse 93   Temp 98.1 ?F (36.7 ?C)   Wt 241 lb 3.2 oz (109.4 kg)   SpO2 95%   BMI 35.62 kg/m?   ?Wt Readings from Last 3  Encounters:  ?09/17/21 241 lb 3.2 oz (109.4 kg)  ?08/23/21 242 lb 8 oz (110 kg)  ?08/21/21 239 lb 9.6 oz (108.7 kg)  ?  ?Physical Exam ?Vitals and nursing note reviewed.  ?Constitutional:   ?   General: He is not in acute distress. ?   Appearance: Normal appearance. He is not ill-appearing, toxic-appearing or diaphoretic.  ?HENT:  ?   Head: Normocephalic and atraumatic.  ?   Right Ear: External ear normal.  ?   Left Ear: External ear normal.  ?   Nose: Nose normal.  ?   Mouth/Throat:  ?   Mouth: Mucous membranes are moist.  ?   Pharynx: Oropharynx is clear.  ?Eyes:  ?   General: No scleral icterus.    ?   Right  eye: No discharge.     ?   Left eye: No discharge.  ?   Extraocular Movements: Extraocular movements intact.  ?   Conjunctiva/sclera: Conjunctivae normal.  ?   Pupils: Pupils are equal, round, and reactive to light.  ?Cardiovascular:  ?   Rate and Rhythm: Normal rate and regular rhythm.  ?   Pulses: Normal pulses.  ?   Heart sounds: Normal heart sounds. No murmur heard. ?  No friction rub. No gallop.  ?Pulmonary:  ?   Effort: Pulmonary effort is normal. No respiratory distress.  ?   Breath sounds: Normal breath sounds. No stridor. No wheezing, rhonchi or rales.  ?Chest:  ?   Chest wall: No tenderness.  ?Musculoskeletal:     ?   General: Normal range of motion.  ?   Cervical back: Normal range of motion and neck supple.  ?Skin: ?   General: Skin is warm and dry.  ?   Capillary Refill: Capillary refill takes less than 2 seconds.  ?   Coloration: Skin is not jaundiced or pale.  ?   Findings: No bruising, erythema, lesion or rash.  ?Neurological:  ?   General: No focal deficit present.  ?   Mental Status: He is alert and oriented to person, place, and time. Mental status is at baseline.  ?Psychiatric:     ?   Mood and Affect: Mood normal.     ?   Behavior: Behavior normal.     ?   Thought Content: Thought content normal.     ?   Judgment: Judgment normal.  ? ? ?Results for orders placed or performed in visit on 07/23/21  ?ECHOCARDIOGRAM COMPLETE  ?Result Value Ref Range  ? AR max vel 2.73 cm2  ? AV Peak grad 4.7 mmHg  ? Ao pk vel 1.08 m/s  ? S' Lateral 2.40 cm  ? Area-P 1/2 5.62 cm2  ? AV Area VTI 2.43 cm2  ? AV Mean grad 3.0 mmHg  ? Single Plane A4C EF 58.2 %  ? Single Plane A2C EF 67.5 %  ? Calc EF 63.2 %  ? AV Area mean vel 2.60 cm2  ? ?   ?Assessment & Plan:  ? ?Problem List Items Addressed This Visit   ? ?  ? Endocrine  ? DM (diabetes mellitus), type 2 with renal complications ([place]) - Primary  ?  Doing well with A1c of 6.5. Continue diet and exercise. Call with any concerns. Continue to monitor. Call with any  concerns.  ? ?  ?  ? Relevant Medications  ? Semaglutide, 1 MG/DOSE, (OZEMPIC, 1 MG/DOSE,) 2 MG/1.5ML SOPN  ? metformin (FORTAMET) 1000 MG (OSM) 24 hr tablet  ?  Other Relevant Orders  ? CBC with Differential/Platelet  ? Bayer DCA Hb A1c Waived  ? Comprehensive metabolic panel  ? Lipid Panel w/o Chol/HDL Ratio  ? Microalbumin, Urine Waived  ?  ? Genitourinary  ? Benign hypertensive renal disease  ?  Under good control on current regimen. Continue current regimen. Continue to monitor. Call with any concerns. Refills given. Labs drawn today.  ? ? ?  ?  ?  ? Other  ? Hyperlipidemia  ?  Under good control on current regimen. Continue current regimen. Continue to monitor. Call with any concerns. Refills given. Labs drawn today.  ? ? ?  ?  ? Relevant Medications  ? metoprolol tartrate (LOPRESSOR) 25 MG tablet  ? ?Other Visit Diagnoses   ? ? Acute anxiety      ? Has lorazepam PRN. Call with any concerns. Continue to monitor.   ? ?  ?  ? ?Follow up plan: ?Return in about 3 months (around 12/18/2021) for physical. ? ? ? ? ? ?

## 2021-09-18 ENCOUNTER — Ambulatory Visit (INDEPENDENT_AMBULATORY_CARE_PROVIDER_SITE_OTHER): Payer: BC Managed Care – PPO | Admitting: Neurology

## 2021-09-18 DIAGNOSIS — G4733 Obstructive sleep apnea (adult) (pediatric): Secondary | ICD-10-CM | POA: Diagnosis not present

## 2021-09-18 DIAGNOSIS — G478 Other sleep disorders: Secondary | ICD-10-CM

## 2021-09-18 DIAGNOSIS — G4719 Other hypersomnia: Secondary | ICD-10-CM

## 2021-09-18 DIAGNOSIS — I25118 Atherosclerotic heart disease of native coronary artery with other forms of angina pectoris: Secondary | ICD-10-CM

## 2021-09-18 DIAGNOSIS — R61 Generalized hyperhidrosis: Secondary | ICD-10-CM

## 2021-09-18 DIAGNOSIS — R0681 Apnea, not elsewhere classified: Secondary | ICD-10-CM

## 2021-09-18 DIAGNOSIS — R0683 Snoring: Secondary | ICD-10-CM

## 2021-09-18 LAB — COMPREHENSIVE METABOLIC PANEL
ALT: 20 IU/L (ref 0–44)
AST: 11 IU/L (ref 0–40)
Albumin/Globulin Ratio: 1.9 (ref 1.2–2.2)
Albumin: 4.6 g/dL (ref 4.0–5.0)
Alkaline Phosphatase: 101 IU/L (ref 44–121)
BUN/Creatinine Ratio: 8 — ABNORMAL LOW (ref 9–20)
BUN: 9 mg/dL (ref 6–24)
Bilirubin Total: 0.9 mg/dL (ref 0.0–1.2)
CO2: 22 mmol/L (ref 20–29)
Calcium: 9.6 mg/dL (ref 8.7–10.2)
Chloride: 103 mmol/L (ref 96–106)
Creatinine, Ser: 1.12 mg/dL (ref 0.76–1.27)
Globulin, Total: 2.4 g/dL (ref 1.5–4.5)
Glucose: 153 mg/dL — ABNORMAL HIGH (ref 70–99)
Potassium: 3.8 mmol/L (ref 3.5–5.2)
Sodium: 139 mmol/L (ref 134–144)
Total Protein: 7 g/dL (ref 6.0–8.5)
eGFR: 83 mL/min/{1.73_m2} (ref >59)

## 2021-09-18 LAB — LIPID PANEL W/O CHOL/HDL RATIO
Cholesterol, Total: 165 mg/dL (ref 100–199)
HDL: 36 mg/dL — ABNORMAL LOW (ref >39)
LDL Chol Calc (NIH): 97 mg/dL (ref 0–99)
Triglycerides: 182 mg/dL — ABNORMAL HIGH (ref 0–149)
VLDL Cholesterol Cal: 32 mg/dL (ref 5–40)

## 2021-09-18 LAB — CBC WITH DIFFERENTIAL/PLATELET
Basophils Absolute: 0.1 10*3/uL (ref 0.0–0.2)
Basos: 1 %
EOS (ABSOLUTE): 0.2 10*3/uL (ref 0.0–0.4)
Eos: 2 %
Hematocrit: 46.9 % (ref 37.5–51.0)
Hemoglobin: 15.9 g/dL (ref 13.0–17.7)
Immature Grans (Abs): 0 10*3/uL (ref 0.0–0.1)
Immature Granulocytes: 0 %
Lymphocytes Absolute: 1.9 10*3/uL (ref 0.7–3.1)
Lymphs: 19 %
MCH: 32.1 pg (ref 26.6–33.0)
MCHC: 33.9 g/dL (ref 31.5–35.7)
MCV: 95 fL (ref 79–97)
Monocytes Absolute: 0.5 10*3/uL (ref 0.1–0.9)
Monocytes: 5 %
Neutrophils Absolute: 7.5 10*3/uL — ABNORMAL HIGH (ref 1.4–7.0)
Neutrophils: 73 %
Platelets: 177 10*3/uL (ref 150–450)
RBC: 4.95 x10E6/uL (ref 4.14–5.80)
RDW: 13 % (ref 11.6–15.4)
WBC: 10.2 10*3/uL (ref 3.4–10.8)

## 2021-09-22 DIAGNOSIS — G478 Other sleep disorders: Secondary | ICD-10-CM | POA: Insufficient documentation

## 2021-09-22 DIAGNOSIS — R61 Generalized hyperhidrosis: Secondary | ICD-10-CM | POA: Insufficient documentation

## 2021-09-22 DIAGNOSIS — G4719 Other hypersomnia: Secondary | ICD-10-CM | POA: Insufficient documentation

## 2021-09-22 NOTE — Procedures (Signed)
PATIENT'S NAME:  Casey Cole, Casey Cole DOB:      07-17-1975      MR#:    [id number]     DATE OF RECORDING: 09/18/2021 REFERRING M.D.:  Park Liter, DO Study Performed:   Baseline Polysomnogram, was ordered as SPLIT study with mask fitting. HISTORY:  Casey Cole is a 46 y.o. Caucasian male patient seen here as a referral on 08/23/2021 from his PCP.  Chief concern according to patient: knows he has OSA. Did not tolerate CPAP, had last PSG at [facility] 5 years ago. Has been prescribed CPAP but last used it over 2 years-ago - He bought that one off [email or web address] with a prescription for 18 cm water, no RAMP/  In a recent ED visit he presented with chest pain and was diagnosed with hypercholesterolemia, tachycardia, and was treated for pneumonia after chest XRay- 06-2021, cardiologist Dr. Garen Lah, diagnosed CAD.  Casey Cole  has a past medical history of Abscess of superficial perineal space (08/23/2015), Depression (10/27/2015), Diabetes mellitus ([place]), Hyperlipidemia, Hypertension, Obesity, Recurrent major depressive disorder, Retrognathia, Rhinitis, Nocturia,  nocturnal diaphoresis,  ([place]) (10/26/2018),  GERD, and Untreated Sleep apnea.  My visit note was dictated with the patient in the room: I ordered Nasonex to be used in preparation for the sleep study and at the sleep study - nasal patency is important for CPAP use, SPLIT was ordered at AHI 20 , but patient declined  trying masks or titration during stay here at [facility].    The patient endorsed the Epworth Sleepiness Scale at 15/24 points.   The patient's weight 242 pounds with a height of 69 (inches), resulting in a BMI of 35.9 kg/m2. The patient's neck circumference measured 19 inches.  CURRENT MEDICATIONS: Aspirin, Lipitor, Flexeril, Voltaren, Tremfya [place], Fortamet, Lopressor, Nitrostat, Prilosec, Ozempic, Bactrim DS   PROCEDURE:  This is a multichannel digital polysomnogram utilizing the Somnostar 11.2 system.  Electrodes and sensors were  applied and monitored per AASM Specifications.   EEG, EOG, Chin and Limb EMG, were sampled at 200 Hz.  ECG, Snore and Nasal Pressure, Thermal Airflow, Respiratory Effort, CPAP Flow and Pressure, Oximetry was sampled at 50 Hz. Digital video and audio were recorded.      BASELINE STUDY Lights Out was at 22:09 and Lights On at 05:00.  Total recording time (TRT) was 411 minutes, with a total sleep time (TST) of 326.5 minutes.   The patient's sleep latency was 51.5 minutes.  REM latency was 131.5 minutes.  The sleep efficiency was 79.4 %.     SLEEP ARCHITECTURE: WASO (Wake after sleep onset) was 71.5 minutes.  There were 32.5 minutes in Stage N1, 216.5 minutes Stage N2, 30 minutes Stage N3 and 47.5 minutes in Stage REM.  The percentage of Stage N1 was 10.%, Stage N2 was 66.3%, Stage N3 was 9.2% and Stage R (REM sleep) was 14.5%.     RESPIRATORY ANALYSIS:  There were a total of 412 respiratory events:  149 obstructive apneas, 0 central apneas and 0 mixed apneas with a total of 149 apneas and an apnea index (AI) of 27.4 /hour. There were 263 hypopneas with a hypopnea index of 48.3 /hour.      The total APNEA/HYPOPNEA INDEX (AHI) was 75.7/hour and the total RESPIRATORY DISTURBANCE INDEX was  75.7 /hour.   64 events occurred in REM sleep and 494 events in NREM. The REM AHI was  80.8 /hour, versus a non-REM AHI of 74.8.  The patient spent 158.5 minutes of total sleep time in  the supine position and 168 minutes in non-supine. The supine AHI was 86.6 versus a non-supine AHI of 65.3.  OXYGEN SATURATION & C02:  The Wake baseline 02 saturation was 94%, with the lowest being 75%. Time spent below 89% saturation equaled 150 minutes. The arousals were noted as: 40 were spontaneous, 0 were associated with PLMs, 177 were associated with respiratory events. The patient had a total of 0 Periodic Limb Movements.   Audio and video analysis did not show any abnormal or unusual movements, behaviors, phonations or  vocalizations.  The patient took bathroom breaks. Loud Snoring was noted. EKG was in keeping with sinus rhythm, but tachycardia was seen.    IMPRESSION:  This patient has most a severe degree of Obstructive Sleep Apnea (OSA) and associated HYPOXEMIA.  Patient declined attempts to SPLIT the study as or4edered, he did not want to try CPAP. He reached an AHI of 75/h (!) supine AHI of 86.6/h.  The recorded SLEEP hypoxia alone is severe, nadir 02 sat was 75% , and total desaturation time was 150 minutes.  Loud Primary Snoring Tachy-brady EKG    RECOMMENDATIONS:  I understand this patient's reluctance, but a trial of BiPAP after taking nasal spray would have been the effort we needed to see from him.  His sleep related hypoxia, hypopnea (hypoventilation) and BMI make him neither a candidate for INSPIRE nor a dental device. I can at this time not offer FDA approved alternatives to PAP therapy.  He must lose weight, avoid supine sleep and may sleep better reclined,  Mr. Golubski may find help by actively and medically guided weight loss, by undergoing surgery for nasal patency, even UPPP, and he may still need supplemental oxygen. This would be a choice to discuss with his primary physician, pulmonology and /or ENT. The inspire device is currently not FDA approved for his situation.   I certify that I have reviewed the entire raw data recording prior to the issuance of this report in accordance with the Standards of Accreditation of the American Academy of Sleep Medicine (AASM)    Larey Seat, MD Diplomat, American Board of Neurology  Diplomat, American Board of Sleep Medicine Market researcher, [employer] [facility] [employer]

## 2021-09-22 NOTE — Progress Notes (Signed)
The total APNEA/HYPOPNEA INDEX (AHI) was 75.7/hour .The  02 saturation was lowest at 75%. Time spent below 89% saturation equaled 150 minutes. BMI exceeds 35. 1. This patient has most a severe degree of Obstructive Sleep Apnea (OSA) and associated HYPOXEMIA.  2. Patient declined attempts to SPLIT the study per protocol as ordered while in his visit , he declined to try PAP.  He reached an AHI of 75/h (!) supine AHI of 86.6/h. The recorded SLEEP hypoxia alone is severe, nadir 02 sat was 75% , and total desaturation time was 150 minutes.  4. Loud Primary Snoring 5. Tachy-brady EKG    RECOMMENDATIONS:  1. I understand this patient's reluctance, but a trial of CPAP /BiPAP after having taking nasal spray would have been the effort we needed to see from him.  2. His sleep related hypoxia, hypopnea (hypoventilation) and BMI make him neither a candidate for INSPIRE nor a dental device. I can at this time not offer FDA approved alternatives to PAP therapy.  3. He must lose weight, avoid supine sleep and may sleep better reclined,  4. Casey Cole may find help by actively and medically guided weight loss, by undergoing surgery for nasal patency, even UPPP, and he may still need supplemental oxygen. This would be a choice to discuss with his primary physician, pulmonology and /or ENT.  The inspire device is currently not FDA approved for his situation but the company has applied for changes in qualification criteria. Please not that inspire will be unlikely to treat hypoxia / hypopneas effectively. It would reduce the AHI , not resolve the problem.

## 2021-09-25 ENCOUNTER — Encounter: Payer: Self-pay | Admitting: Neurology

## 2021-11-09 ENCOUNTER — Other Ambulatory Visit: Payer: Self-pay

## 2021-11-09 DIAGNOSIS — E782 Mixed hyperlipidemia: Secondary | ICD-10-CM

## 2021-11-09 NOTE — Progress Notes (Signed)
Lab order changed as requested.  Gerringer, Tilden Fossa, Jamae Tison, RN Hello,  Patient was contacted to have labs completed.  Please change order for medical mall.  Thank you

## 2021-11-19 DIAGNOSIS — Z79899 Other long term (current) drug therapy: Secondary | ICD-10-CM | POA: Diagnosis not present

## 2021-11-19 DIAGNOSIS — L400 Psoriasis vulgaris: Secondary | ICD-10-CM | POA: Diagnosis not present

## 2021-12-11 ENCOUNTER — Encounter: Payer: Self-pay | Admitting: Nurse Practitioner

## 2021-12-11 ENCOUNTER — Ambulatory Visit: Payer: BC Managed Care – PPO | Admitting: Nurse Practitioner

## 2021-12-11 DIAGNOSIS — H6690 Otitis media, unspecified, unspecified ear: Secondary | ICD-10-CM | POA: Insufficient documentation

## 2021-12-11 DIAGNOSIS — H6502 Acute serous otitis media, left ear: Secondary | ICD-10-CM | POA: Diagnosis not present

## 2021-12-11 MED ORDER — AMOXICILLIN-POT CLAVULANATE 875-125 MG PO TABS: 1.0000 | ORAL_TABLET | Freq: Two times a day (BID) | ORAL | 0 refills | Status: AC

## 2021-12-11 NOTE — Progress Notes (Signed)
BP 114/72   Pulse 96   Temp 98.2 F (36.8 C) (Oral)   Ht $R'5\' 9"'NA$  (1.753 m)   Wt 248 lb 14.4 oz (112.9 kg)   SpO2 94%   BMI 36.76 kg/m    Subjective:    Patient ID: Casey Cole, male    DOB: Dec 04, 1975, 46 y.o.   MRN: [id number]  HPI: Casey Cole is a 46 y.o. male  Chief Complaint  Patient presents with   Ear Pain    Patient is here for left ear pain. Patient says his son recently got over an ear infection this past week. Patient says he woke up this morning with an earache. Patient says his son was prescribed some ear drops. Patient says he has a little bit of a headache associated with the earache.    EAR PAIN Started with left ear pain this morning, son recently had ear infection.   Duration: days Involved ear(s): left Severity:  6/10  Quality:  sharp and aching Fever: no Otorrhea: no Upper respiratory infection symptoms: no Pruritus: no Hearing loss: no Water immersion no Using Q-tips:  rarely Recurrent otitis media: no Status: fluctuating Treatments attempted: none   Relevant past medical, surgical, family and social history reviewed and updated as indicated. Interim medical history since our last visit reviewed. Allergies and medications reviewed and updated.  Review of Systems  Constitutional:  Negative for activity change, diaphoresis, fatigue and fever.  Respiratory:  Negative for cough, chest tightness, shortness of breath and wheezing.   Cardiovascular:  Negative for chest pain, palpitations and leg swelling.  Neurological: Negative.   Psychiatric/Behavioral: Negative.      Per HPI unless specifically indicated above     Objective:    BP 114/72   Pulse 96   Temp 98.2 F (36.8 C) (Oral)   Ht $R'5\' 9"'pI$  (1.753 m)   Wt 248 lb 14.4 oz (112.9 kg)   SpO2 94%   BMI 36.76 kg/m   Wt Readings from Last 3 Encounters:  12/11/21 248 lb 14.4 oz (112.9 kg)  09/17/21 241 lb 3.2 oz (109.4 kg)  08/23/21 242 lb 8 oz (110 kg)    Physical Exam Vitals and  nursing note reviewed.  Constitutional:      General: He is awake. He is not in acute distress.    Appearance: He is well-developed and well-groomed. He is obese. He is not ill-appearing or toxic-appearing.  HENT:     Head: Normocephalic and atraumatic.     Right Ear: Hearing, tympanic membrane, ear canal and external ear normal. No drainage. There is no impacted cerumen.     Left Ear: Hearing, ear canal and external ear normal. No drainage or tenderness. There is no impacted cerumen. Tympanic membrane is injected and perforated (reports from years ago).     Nose: Nose normal.     Right Sinus: No maxillary sinus tenderness or frontal sinus tenderness.     Left Sinus: No maxillary sinus tenderness or frontal sinus tenderness.     Mouth/Throat:     Mouth: Mucous membranes are moist.     Pharynx: Uvula midline. Posterior oropharyngeal erythema (mild with cobblestoning) present. No pharyngeal swelling or oropharyngeal exudate.  Eyes:     General: Lids are normal.        Right eye: No discharge.        Left eye: No discharge.     Conjunctiva/sclera: Conjunctivae normal.     Pupils: Pupils are equal, round, and reactive to light.  Neck:     Thyroid: No thyromegaly.     Vascular: No carotid bruit.     Trachea: Trachea normal.  Cardiovascular:     Rate and Rhythm: Normal rate and regular rhythm.     Heart sounds: Normal heart sounds, S1 normal and S2 normal. No murmur heard.    No gallop.  Pulmonary:     Effort: Pulmonary effort is normal. No accessory muscle usage or respiratory distress.     Breath sounds: Normal breath sounds.  Abdominal:     General: Bowel sounds are normal.     Palpations: Abdomen is soft.  Musculoskeletal:        General: Normal range of motion.     Cervical back: Normal range of motion and neck supple.     Right lower leg: No edema.     Left lower leg: No edema.  Skin:    General: Skin is warm and dry.     Capillary Refill: Capillary refill takes less than 2  seconds.  Neurological:     Mental Status: He is alert and oriented to person, place, and time.     Deep Tendon Reflexes: Reflexes are normal and symmetric.  Psychiatric:        Attention and Perception: Attention normal.        Mood and Affect: Mood normal.        Speech: Speech normal.        Behavior: Behavior normal. Behavior is cooperative.        Thought Content: Thought content normal.    Results for orders placed or performed in visit on 09/17/21  CBC with Differential/Platelet  Result Value Ref Range   WBC 10.2 3.4 - 10.8 x10E3/uL   RBC 4.95 4.14 - 5.80 x10E6/uL   Hemoglobin 15.9 13.0 - 17.7 g/dL   Hematocrit 46.9 37.5 - 51.0 %   MCV 95 79 - 97 fL   MCH 32.1 26.6 - 33.0 pg   MCHC 33.9 31.5 - 35.7 g/dL   RDW 13.0 11.6 - 15.4 %   Platelets 177 150 - 450 x10E3/uL   Neutrophils 73 Not Estab. %   Lymphs 19 Not Estab. %   Monocytes 5 Not Estab. %   Eos 2 Not Estab. %   Basos 1 Not Estab. %   Neutrophils Absolute 7.5 (H) 1.4 - 7.0 x10E3/uL   Lymphocytes Absolute 1.9 0.7 - 3.1 x10E3/uL   Monocytes Absolute 0.5 0.1 - 0.9 x10E3/uL   EOS (ABSOLUTE) 0.2 0.0 - 0.4 x10E3/uL   Basophils Absolute 0.1 0.0 - 0.2 x10E3/uL   Immature Granulocytes 0 Not Estab. %   Immature Grans (Abs) 0.0 0.0 - 0.1 x10E3/uL  Bayer DCA Hb A1c Waived  Result Value Ref Range   HB A1C (BAYER DCA - WAIVED) 6.5 (H) 4.8 - 5.6 %  Comprehensive metabolic panel  Result Value Ref Range   Glucose 153 (H) 70 - 99 mg/dL   BUN 9 6 - 24 mg/dL   Creatinine, Ser 1.12 0.76 - 1.27 mg/dL   eGFR 83 >59 mL/min/1.73   BUN/Creatinine Ratio 8 (L) 9 - 20   Sodium 139 134 - 144 mmol/L   Potassium 3.8 3.5 - 5.2 mmol/L   Chloride 103 96 - 106 mmol/L   CO2 22 20 - 29 mmol/L   Calcium 9.6 8.7 - 10.2 mg/dL   Total Protein 7.0 6.0 - 8.5 g/dL   Albumin 4.6 4.0 - 5.0 g/dL   Globulin, Total 2.4 1.5 - 4.5 g/dL  Albumin/Globulin Ratio 1.9 1.2 - 2.2   Bilirubin Total 0.9 0.0 - 1.2 mg/dL   Alkaline Phosphatase 101 44 - 121 IU/L    AST 11 0 - 40 IU/L   ALT 20 0 - 44 IU/L  Lipid Panel w/o Chol/HDL Ratio  Result Value Ref Range   Cholesterol, Total 165 100 - 199 mg/dL   Triglycerides 182 (H) 0 - 149 mg/dL   HDL 36 (L) >39 mg/dL   VLDL Cholesterol Cal 32 5 - 40 mg/dL   LDL Chol Calc (NIH) 97 0 - 99 mg/dL  Microalbumin, Urine Waived  Result Value Ref Range   Microalb, Ur Waived 10 0 - 19 mg/L   Creatinine, Urine Waived 200 10 - 300 mg/dL   Microalb/Creat Ratio <30 <30 mg/g      Assessment & Plan:   Problem List Items Addressed This Visit       Nervous and Auditory   Acute otitis media    Acute, for 24 hours.  Will start Augmentin BID for 7 days.  Recommend avoid water into ear.  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  Return for worsening or ongoing.      Relevant Medications   amoxicillin-clavulanate (AUGMENTIN) 875-125 MG tablet     Follow up plan: Return if symptoms worsen or fail to improve.

## 2021-12-11 NOTE — Patient Instructions (Signed)
Otitis Media, Adult  Otitis media is a condition in which the middle ear is red and swollen (inflamed) and full of fluid. The middle ear is the part of the ear that contains bones for hearing as well as air that helps send sounds to the brain. The condition usually goes away on its own. What are the causes? This condition is caused by a blockage in the eustachian tube. This tube connects the middle ear to the back of the nose. It normally allows air into the middle ear. The blockage is caused by fluid or swelling. Problems that can cause blockage include: A cold or infection that affects the nose, mouth, or throat. Allergies. An irritant, such as tobacco smoke. Adenoids that have become large. The adenoids are soft tissue located in the back of the throat, behind the nose and the roof of the mouth. Growth or swelling in the upper part of the throat, just behind the nose (nasopharynx). Damage to the ear caused by a change in pressure. This is called barotrauma. What increases the risk? You are more likely to develop this condition if you: Smoke or are exposed to tobacco smoke. Have an opening in the roof of your mouth (cleft palate). Have acid reflux. Have problems in your body's defense system (immune system). What are the signs or symptoms? Symptoms of this condition include: Ear pain. Fever. Problems with hearing. Being tired. Fluid leaking from the ear. Ringing in the ear. How is this treated? This condition can go away on its own within 3-5 days. But if the condition is caused by germs (bacteria) and does not go away on its own, or if it keeps coming back, your doctor may: Give you antibiotic medicines. Give you medicines for pain. Follow these instructions at home: Take over-the-counter and prescription medicines only as told by your doctor. If you were prescribed an antibiotic medicine, take it as told by your doctor. Do not stop taking it even if you start to feel better. Keep  all follow-up visits. Contact a doctor if: You have bleeding from your nose. There is a lump on your neck. You are not feeling better in 5 days. You feel worse instead of better. Get help right away if: You have pain that is not helped with medicine. You have swelling, redness, or pain around your ear. You get a stiff neck. You cannot move part of your face (paralysis). You notice that the bone behind your ear hurts when you touch it. You get a very bad headache. Summary Otitis media means that the middle ear is red, swollen, and full of fluid. This condition usually goes away on its own. If the problem does not go away, treatment may be needed. You may be given medicines to treat the infection or to treat your pain. If you were prescribed an antibiotic medicine, take it as told by your doctor. Do not stop taking it even if you start to feel better. Keep all follow-up visits. This information is not intended to replace advice given to you by your health care provider. Make sure you discuss any questions you have with your health care provider. Document Revised: 07/31/2020 Document Reviewed: 07/31/2020 Elsevier Patient Education  2023 [employer] Inc[employer]  

## 2021-12-11 NOTE — Assessment & Plan Note (Signed)
Acute, for 24 hours.  Will start Augmentin BID for 7 days.  Recommend avoid water into ear.  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  Return for worsening or ongoing.

## 2021-12-24 ENCOUNTER — Ambulatory Visit (INDEPENDENT_AMBULATORY_CARE_PROVIDER_SITE_OTHER): Payer: BC Managed Care – PPO | Admitting: Family Medicine

## 2021-12-24 ENCOUNTER — Encounter: Payer: Self-pay | Admitting: Family Medicine

## 2021-12-24 VITALS — BP 118/77 | HR 97 | Temp 98.0°F | Wt 243.7 lb

## 2021-12-24 DIAGNOSIS — Z0000 Encounter for general adult medical examination without abnormal findings: Secondary | ICD-10-CM | POA: Diagnosis not present

## 2021-12-24 DIAGNOSIS — E1129 Type 2 diabetes mellitus with other diabetic kidney complication: Secondary | ICD-10-CM | POA: Diagnosis not present

## 2021-12-24 DIAGNOSIS — F419 Anxiety disorder, unspecified: Secondary | ICD-10-CM

## 2021-12-24 DIAGNOSIS — E782 Mixed hyperlipidemia: Secondary | ICD-10-CM | POA: Diagnosis not present

## 2021-12-24 DIAGNOSIS — I129 Hypertensive chronic kidney disease with stage 1 through stage 4 chronic kidney disease, or unspecified chronic kidney disease: Secondary | ICD-10-CM

## 2021-12-24 DIAGNOSIS — R809 Proteinuria, unspecified: Secondary | ICD-10-CM

## 2021-12-24 DIAGNOSIS — Z136 Encounter for screening for cardiovascular disorders: Secondary | ICD-10-CM | POA: Diagnosis not present

## 2021-12-24 DIAGNOSIS — Z1211 Encounter for screening for malignant neoplasm of colon: Secondary | ICD-10-CM

## 2021-12-24 LAB — MICROALBUMIN, URINE WAIVED
Creatinine, Urine Waived: 300 mg/dL (ref 10–300)
Microalb, Ur Waived: 30 mg/L — ABNORMAL HIGH (ref 0–19)
Microalb/Creat Ratio: <30 mg/g (ref <30)

## 2021-12-24 LAB — URINALYSIS, ROUTINE W REFLEX MICROSCOPIC
Bilirubin, UA: NEGATIVE
Glucose, UA: NEGATIVE
Ketones, UA: NEGATIVE
Leukocytes,UA: NEGATIVE
Nitrite, UA: NEGATIVE
Protein,UA: NEGATIVE
RBC, UA: NEGATIVE
Specific Gravity, UA: >1.03 — ABNORMAL HIGH (ref 1.005–1.030)
Urobilinogen, Ur: 1 mg/dL (ref 0.2–1.0)
pH, UA: 6 (ref 5.0–7.5)

## 2021-12-24 LAB — BAYER DCA HB A1C WAIVED: HB A1C (BAYER DCA - WAIVED): 8.5 % — ABNORMAL HIGH (ref 4.8–5.6)

## 2021-12-24 MED ORDER — SEMAGLUTIDE (2 MG/DOSE) 8 MG/3ML ~~LOC~~ SOPN: 2.0000 mg | PEN_INJECTOR | SUBCUTANEOUS | 1 refills | Status: DC

## 2021-12-24 NOTE — Assessment & Plan Note (Signed)
Not doing great with A1c up to 8.5 up from 6.5. Will increase his ozempic to '2mg'$  and recheck 3 months. Call with any concerns.

## 2021-12-24 NOTE — Assessment & Plan Note (Addendum)
Under good control on current regimen. Continue current regimen. Continue to monitor. Call with any concerns. Refills up to date. Labs drawn today.  

## 2021-12-24 NOTE — Assessment & Plan Note (Addendum)
Under good control on current regimen. Continue current regimen. Continue to monitor. Call with any concerns. Refills up to date.   

## 2021-12-24 NOTE — Progress Notes (Signed)
BP 118/77   Pulse 97   Temp 98 F (36.7 C)   Wt 243 lb 11.2 oz (110.5 kg)   SpO2 97%   BMI 35.99 kg/m    Subjective:    Patient ID: Casey Cole, male    DOB: 13-Mar-1976, 46 y.o.   MRN: [id number]  HPI: Casey Cole is a 46 y.o. male presenting on 12/24/2021 for comprehensive medical examination. Current medical complaints include:  DIABETES Hypoglycemic episodes:no Polydipsia/polyuria: no Visual disturbance: no Chest pain: no Paresthesias: no Glucose Monitoring: no  Accucheck frequency: Not Checking Taking Insulin?: no Blood Pressure Monitoring: not checking Retinal Examination: Not up to Date Foot Exam: Up to Date Diabetic Education: Completed Pneumovax: Up to Date Influenza: Up to Date Aspirin: no  HYPERTENSION / [place] Satisfied with current treatment? yes Duration of hypertension: chronic BP monitoring frequency: not checking BP medication side effects: no Past BP meds: metoprolol Duration of hyperlipidemia: chronic Cholesterol medication side effects: no Cholesterol supplements: none Past cholesterol medications: atorvastatin Medication compliance: excellent compliance Aspirin: yes Recent stressors: no Recurrent headaches: no Visual changes: no Palpitations: no Dyspnea: no Chest pain: no Lower extremity edema: no Dizzy/lightheaded: no  ANXIETY/STRESS Duration: chronic Status:stable Anxious mood: yes  Excessive worrying: no Irritability: no  Sweating: no Nausea: no Palpitations:no Hyperventilation: no Panic attacks: no Agoraphobia: no  Obscessions/compulsions: no Depressed mood: no    12/24/2021    8:42 AM 12/11/2021    2:02 PM 06/11/2021    4:11 PM 01/01/2021    3:42 PM 07/03/2020    4:02 PM  Depression screen PHQ 2/9  Decreased Interest 0 0 1 0 0  Down, Depressed, Hopeless 0 0 1 0 0  PHQ - 2 Score 0 0 2 0 0  Altered sleeping 0 0 2 0 0  Tired, decreased energy 0 0 2 0 0  Change in appetite 0 0 3 0 0  Feeling bad or failure  about yourself  0 0 0 0 0  Trouble concentrating 0 0 2 0 0  Moving slowly or fidgety/restless 0 0 0 0 0  Suicidal thoughts 0 0 0 0 0  PHQ-9 Score 0 0 11 0 0  Difficult doing work/chores Not difficult at all Not difficult at all  Not difficult at all Not difficult at all   Anhedonia: no Weight changes: no Insomnia: no   Hypersomnia: no Fatigue/loss of energy: no Feelings of worthlessness: no Feelings of guilt: no Impaired concentration/indecisiveness: no Suicidal ideations: no  Crying spells: no Recent Stressors/Life Changes: no   Relationship problems: no   Family stress: no     Financial stress: no    Job stress: no    Recent death/loss: no  Interim Problems from his last visit: no  Depression Screen done today and results listed below:     12/24/2021    8:42 AM 12/11/2021    2:02 PM 06/11/2021    4:11 PM 01/01/2021    3:42 PM 07/03/2020    4:02 PM  Depression screen PHQ 2/9  Decreased Interest 0 0 1 0 0  Down, Depressed, Hopeless 0 0 1 0 0  PHQ - 2 Score 0 0 2 0 0  Altered sleeping 0 0 2 0 0  Tired, decreased energy 0 0 2 0 0  Change in appetite 0 0 3 0 0  Feeling bad or failure about yourself  0 0 0 0 0  Trouble concentrating 0 0 2 0 0  Moving slowly or fidgety/restless 0 0 0 0  0  Suicidal thoughts 0 0 0 0 0  PHQ-9 Score 0 0 11 0 0  Difficult doing work/chores Not difficult at all Not difficult at all  Not difficult at all Not difficult at all    Past Medical History:  Past Medical History:  Diagnosis Date   Abscess of superficial perineal space 08/23/2015   Depression 10/27/2015   Diabetes mellitus without complication ([place])    Hyperlipidemia    Hypertension    Recurrent major depressive disorder, in full remission ([place]) 10/26/2018   Sleep apnea     Surgical History:  Past Surgical History:  Procedure Laterality Date   CHOLECYSTECTOMY     CORONARY PRESSURE WIRE/FFR WITH 3D MAPPING N/A 07/17/2021   Procedure: Coronary Pressure Wire/FFR w/3D Mapping;  Surgeon:  Nelva Bush, MD;  Location: [facility];  Service: Cardiovascular;  Laterality: N/A;   INCISE AND DRAIN ABCESS Left 07-12-15   left buttock   INCISION AND DRAINAGE PERIRECTAL ABSCESS N/A 08/23/2015   Procedure: IRRIGATION AND DEBRIDEMENT PERIRECTAL ABSCESS;  Surgeon: Robert Bellow, MD;  Location: [facility];  Service: General;  Laterality: N/A;  Anal Scope   LEFT HEART CATH AND CORONARY ANGIOGRAPHY Left 07/17/2021   Procedure: LEFT HEART CATH AND CORONARY ANGIOGRAPHY;  Surgeon: Nelva Bush, MD;  Location: [place] [facility];  Service: Cardiovascular;  Laterality: Left;   VASECTOMY      Medications:  Current Outpatient Medications on File Prior to Visit  Medication Sig   aspirin EC 81 MG tablet Take 1 tablet (81 mg total) by mouth daily. Swallow whole.   atorvastatin (LIPITOR) 40 MG tablet Take 1 tablet (40 mg total) by mouth daily.   cyclobenzaprine (FLEXERIL) 10 MG tablet Take 1 tablet (10 mg total) by mouth at bedtime.   diclofenac Sodium (VOLTAREN) 1 % GEL Apply 4 g topically 4 (four) times daily.   Guselkumab (TREMFYA [place]) Inject 1 Dose into the skin as directed. Every 8 weeks after starter doses.   LORazepam (ATIVAN) 0.5 MG tablet Take 1 tablet (0.5 mg total) by mouth 2 (two) times daily as needed for anxiety.   metformin (FORTAMET) 1000 MG (OSM) 24 hr tablet Take 1 tablet (1,000 mg total) by mouth 2 (two) times daily with a meal.   metoprolol tartrate (LOPRESSOR) 25 MG tablet Take 1 tablet (25 mg total) by mouth 2 (two) times daily.   mometasone (NASONEX) 50 MCG/ACT nasal spray Place 2 sprays into the nose daily.   omeprazole (PRILOSEC) 20 MG capsule Take 1 capsule (20 mg total) by mouth daily.   ONE TOUCH ULTRA TEST test strip USE AS DIRECTED ONCE DAILY   nitroGLYCERIN (NITROSTAT) 0.4 MG SL tablet Place 1 tablet (0.4 mg total) under the tongue every 5 (five) minutes as needed for chest pain. For a maximum dose of 3 tablets.   No current facility-administered  medications on file prior to visit.    Allergies:  Allergies  Allergen Reactions   Methylprednisolone     Patient states caused dark urine, chills, and tongue felt burnt     Social History:  Social History   Socioeconomic History   Marital status: Married    Spouse name: Not on file   Number of children: Not on file   Years of education: Not on file   Highest education level: Not on file  Occupational History   Not on file  Tobacco Use   Smoking status: Former    Packs/day: 1.00    Types: Cigarettes  Quit date: 07/14/2021    Years since quitting: 0.4   Smokeless tobacco: Never  Vaping Use   Vaping Use: Never used  Substance and Sexual Activity   Alcohol use: Not Currently   Drug use: No   Sexual activity: Yes  Other Topics Concern   Not on file  Social History Narrative   Caffeine use: 2-3 per day (soda)   Social Determinants of Health   Financial Resource Strain: Not on file  Food Insecurity: Not on file  Transportation Needs: Not on file  Physical Activity: Not on file  Stress: Not on file  Social Connections: Not on file  Intimate Partner Violence: Not on file   Social History   Tobacco Use  Smoking Status Former   Packs/day: 1.00   Types: Cigarettes   Quit date: 07/14/2021   Years since quitting: 0.4  Smokeless Tobacco Never   Social History   Substance and Sexual Activity  Alcohol Use Not Currently    Family History:  Family History  Problem Relation Age of Onset   Hypertension Father    Stroke Paternal Grandfather    Heart attack Paternal Grandfather     Past medical history, surgical history, medications, allergies, family history and social history reviewed with patient today and changes made to appropriate areas of the chart.   Review of Systems  Constitutional: Negative.   HENT: Negative.    Eyes: Negative.   Respiratory: Negative.    Cardiovascular: Negative.   Gastrointestinal: Negative.   Genitourinary: Negative.    Musculoskeletal: Negative.   Skin: Negative.   Neurological: Negative.   Endo/Heme/Allergies: Negative.   Psychiatric/Behavioral: Negative.     All other ROS negative except what is listed above and in the HPI.      Objective:    BP 118/77   Pulse 97   Temp 98 F (36.7 C)   Wt 243 lb 11.2 oz (110.5 kg)   SpO2 97%   BMI 35.99 kg/m   Wt Readings from Last 3 Encounters:  12/24/21 243 lb 11.2 oz (110.5 kg)  12/11/21 248 lb 14.4 oz (112.9 kg)  09/17/21 241 lb 3.2 oz (109.4 kg)    Physical Exam Vitals and nursing note reviewed.  Constitutional:      General: He is not in acute distress.    Appearance: Normal appearance. He is obese. He is not ill-appearing, toxic-appearing or diaphoretic.  HENT:     Head: Normocephalic and atraumatic.     Right Ear: Tympanic membrane, ear canal and external ear normal. There is no impacted cerumen.     Left Ear: Tympanic membrane, ear canal and external ear normal. There is no impacted cerumen.     Nose: Nose normal. No congestion or rhinorrhea.     Mouth/Throat:     Mouth: Mucous membranes are moist.     Pharynx: Oropharynx is clear. No oropharyngeal exudate or posterior oropharyngeal erythema.  Eyes:     General: No scleral icterus.       Right eye: No discharge.        Left eye: No discharge.     Extraocular Movements: Extraocular movements intact.     Conjunctiva/sclera: Conjunctivae normal.     Pupils: Pupils are equal, round, and reactive to light.  Neck:     Vascular: No carotid bruit.  Cardiovascular:     Rate and Rhythm: Normal rate and regular rhythm.     Pulses: Normal pulses.     Heart sounds: No murmur heard.  No friction rub. No gallop.  Pulmonary:     Effort: Pulmonary effort is normal. No respiratory distress.     Breath sounds: Normal breath sounds. No stridor. No wheezing, rhonchi or rales.  Chest:     Chest wall: No tenderness.  Abdominal:     General: Abdomen is flat. Bowel sounds are normal. There is no  distension.     Palpations: Abdomen is soft. There is no mass.     Tenderness: There is no abdominal tenderness. There is no right CVA tenderness, left CVA tenderness, guarding or rebound.     Hernia: No hernia is present.  Genitourinary:    Comments: Genital exam deferred with shared decision making Musculoskeletal:        General: No swelling, tenderness, deformity or signs of injury.     Cervical back: Normal range of motion and neck supple. No rigidity. No muscular tenderness.     Right lower leg: No edema.     Left lower leg: No edema.  Lymphadenopathy:     Cervical: No cervical adenopathy.  Skin:    General: Skin is warm and dry.     Capillary Refill: Capillary refill takes less than 2 seconds.     Coloration: Skin is not jaundiced or pale.     Findings: No bruising, erythema, lesion or rash.  Neurological:     General: No focal deficit present.     Mental Status: He is alert and oriented to person, place, and time.     Cranial Nerves: No cranial nerve deficit.     Sensory: No sensory deficit.     Motor: No weakness.     Coordination: Coordination normal.     Gait: Gait normal.     Deep Tendon Reflexes: Reflexes normal.  Psychiatric:        Mood and Affect: Mood normal.        Behavior: Behavior normal.        Thought Content: Thought content normal.        Judgment: Judgment normal.     Results for orders placed or performed in visit on 12/24/21  Urinalysis, Routine w reflex microscopic  Result Value Ref Range   Specific Gravity, UA >1.030 (H) 1.005 - 1.030   pH, UA 6.0 5.0 - 7.5   Color, UA Yellow Yellow   Appearance Ur Clear Clear   Leukocytes,UA Negative Negative   Protein,UA Negative Negative/Trace   Glucose, UA Negative Negative   Ketones, UA Negative Negative   RBC, UA Negative Negative   Bilirubin, UA Negative Negative   Urobilinogen, Ur 1.0 0.2 - 1.0 mg/dL   Nitrite, UA Negative Negative  Microalbumin, Urine Waived  Result Value Ref Range   Microalb,  Ur Waived 30 (H) 0 - 19 mg/L   Creatinine, Urine Waived 300 10 - 300 mg/dL   Microalb/Creat Ratio <30 <30 mg/g  Bayer DCA Hb A1c Waived  Result Value Ref Range   HB A1C (BAYER DCA - WAIVED) 8.5 (H) 4.8 - 5.6 %      Assessment & Plan:   Problem List Items Addressed This Visit       Endocrine   DM (diabetes mellitus), type 2 with renal complications ([place])    Not doing great with A1c up to 8.5 up from 6.5. Will increase his ozempic to '2mg'$  and recheck 3 months. Call with any concerns.       Relevant Medications   Semaglutide, 2 MG/DOSE, 8 MG/3ML SOPN   Other Relevant Orders  Microalbumin, Urine Waived (Completed)   Bayer DCA Hb A1c Waived (Completed)     Genitourinary   Benign hypertensive renal disease    Under good control on current regimen. Continue current regimen. Continue to monitor. Call with any concerns. Refills up to date. Labs drawn today.        Other   Hyperlipidemia    Under good control on current regimen. Continue current regimen. Continue to monitor. Call with any concerns. Refills up to date. Labs drawn today.       Anxiety    Under good control on current regimen. Continue current regimen. Continue to monitor. Call with any concerns. Refills up to date.      Other Visit Diagnoses     Routine general medical examination at a health care facility    -  Primary   Vaccines up to date. Screening labs checked today. Colonosopy ordered. Continue diet and exercise. Call with any concerns.   Relevant Orders   Comprehensive metabolic panel   CBC with Differential/Platelet   Lipid Panel w/o Chol/HDL Ratio   PSA   TSH   Urinalysis, Routine w reflex microscopic (Completed)   Microalbumin, Urine Waived (Completed)   Bayer DCA Hb A1c Waived (Completed)   Hepatitis C Antibody   Screening for colon cancer       Referral to GI placed today.   Relevant Orders   Ambulatory referral to Gastroenterology        LABORATORY TESTING:  Health maintenance labs  ordered today as discussed above.   The natural history of prostate cancer and ongoing controversy regarding screening and potential treatment outcomes of prostate cancer has been discussed with the patient. The meaning of a false positive PSA and a false negative PSA has been discussed. He indicates understanding of the limitations of this screening test and wishes to proceed with screening PSA testing.   IMMUNIZATIONS:   - Tdap: Tetanus vaccination status reviewed: last tetanus booster within 10 years. - Influenza: Postponed to flu season - Pneumovax: Up to date - Prevnar: Not applicable - COVID: Up to date - HPV: Not applicable - Shingrix vaccine: Not applicable  SCREENING: - Colonoscopy: Ordered today  Discussed with patient purpose of the colonoscopy is to detect colon cancer at curable precancerous or early stages   PATIENT COUNSELING:    Sexuality: Discussed sexually transmitted diseases, partner selection, use of condoms, avoidance of unintended pregnancy  and contraceptive alternatives.   Advised to avoid cigarette smoking.  I discussed with the patient that most people either abstain from alcohol or drink within safe limits (<=14/week and <=4 drinks/occasion for males, <=7/weeks and <= 3 drinks/occasion for females) and that the risk for alcohol disorders and other health effects rises proportionally with the number of drinks per week and how often a drinker exceeds daily limits.  Discussed cessation/primary prevention of drug use and availability of treatment for abuse.   Diet: Encouraged to adjust caloric intake to maintain  or achieve ideal body weight, to reduce intake of dietary saturated fat and total fat, to limit sodium intake by avoiding high sodium foods and not adding table salt, and to maintain adequate dietary potassium and calcium preferably from fresh fruits, vegetables, and low-fat dairy products.    stressed the importance of regular exercise  Injury  prevention: Discussed safety belts, safety helmets, smoke detector, smoking near bedding or upholstery.   Dental health: Discussed importance of regular tooth brushing, flossing, and dental visits.   Follow up plan: NEXT PREVENTATIVE  PHYSICAL DUE IN 1 YEAR. Return in about 3 months (around 03/26/2022).

## 2021-12-25 ENCOUNTER — Telehealth: Payer: Self-pay

## 2021-12-25 ENCOUNTER — Other Ambulatory Visit: Payer: Self-pay

## 2021-12-25 DIAGNOSIS — Z1211 Encounter for screening for malignant neoplasm of colon: Secondary | ICD-10-CM

## 2021-12-25 LAB — CBC WITH DIFFERENTIAL/PLATELET
Basophils Absolute: 0.1 10*3/uL (ref 0.0–0.2)
Basos: 1 %
EOS (ABSOLUTE): 0.2 10*3/uL (ref 0.0–0.4)
Eos: 2 %
Hematocrit: 48.2 % (ref 37.5–51.0)
Hemoglobin: 16.2 g/dL (ref 13.0–17.7)
Immature Grans (Abs): 0 10*3/uL (ref 0.0–0.1)
Immature Granulocytes: 0 %
Lymphocytes Absolute: 1.8 10*3/uL (ref 0.7–3.1)
Lymphs: 19 %
MCH: 31.8 pg (ref 26.6–33.0)
MCHC: 33.6 g/dL (ref 31.5–35.7)
MCV: 95 fL (ref 79–97)
Monocytes Absolute: 0.6 10*3/uL (ref 0.1–0.9)
Monocytes: 6 %
Neutrophils Absolute: 7.1 10*3/uL — ABNORMAL HIGH (ref 1.4–7.0)
Neutrophils: 72 %
Platelets: 165 10*3/uL (ref 150–450)
RBC: 5.1 x10E6/uL (ref 4.14–5.80)
RDW: 13.2 % (ref 11.6–15.4)
WBC: 9.7 10*3/uL (ref 3.4–10.8)

## 2021-12-25 LAB — LIPID PANEL W/O CHOL/HDL RATIO
Cholesterol, Total: 144 mg/dL (ref 100–199)
HDL: 38 mg/dL — ABNORMAL LOW (ref >39)
LDL Chol Calc (NIH): 72 mg/dL (ref 0–99)
Triglycerides: 204 mg/dL — ABNORMAL HIGH (ref 0–149)
VLDL Cholesterol Cal: 34 mg/dL (ref 5–40)

## 2021-12-25 LAB — TSH: TSH: 0.989 u[IU]/mL (ref 0.450–4.500)

## 2021-12-25 LAB — COMPREHENSIVE METABOLIC PANEL
ALT: 46 IU/L — ABNORMAL HIGH (ref 0–44)
AST: 26 IU/L (ref 0–40)
Albumin/Globulin Ratio: 2.1 (ref 1.2–2.2)
Albumin: 4.6 g/dL (ref 4.1–5.1)
Alkaline Phosphatase: 120 IU/L (ref 44–121)
BUN/Creatinine Ratio: 8 — ABNORMAL LOW (ref 9–20)
BUN: 8 mg/dL (ref 6–24)
Bilirubin Total: 1.1 mg/dL (ref 0.0–1.2)
CO2: 22 mmol/L (ref 20–29)
Calcium: 9.3 mg/dL (ref 8.7–10.2)
Chloride: 102 mmol/L (ref 96–106)
Creatinine, Ser: 0.97 mg/dL (ref 0.76–1.27)
Globulin, Total: 2.2 g/dL (ref 1.5–4.5)
Glucose: 161 mg/dL — ABNORMAL HIGH (ref 70–99)
Potassium: 3.8 mmol/L (ref 3.5–5.2)
Sodium: 138 mmol/L (ref 134–144)
Total Protein: 6.8 g/dL (ref 6.0–8.5)
eGFR: 98 mL/min/{1.73_m2} (ref >59)

## 2021-12-25 LAB — HEPATITIS C ANTIBODY: Hep C Virus Ab: NONREACTIVE

## 2021-12-25 LAB — PSA: Prostate Specific Ag, Serum: 0.5 ng/mL (ref 0.0–4.0)

## 2021-12-25 MED ORDER — NA SULFATE-K SULFATE-MG SULF 17.5-3.13-1.6 GM/177ML PO SOLN
354.0000 mL | Freq: Once | ORAL | 0 refills | Status: AC
Start: 2021-12-25 — End: 2021-12-25

## 2021-12-25 NOTE — Telephone Encounter (Signed)
Gastroenterology Pre-Procedure Review  Request Date: 01/17/2022 Requesting Physician: Dr. Vicente Males  PATIENT REVIEW QUESTIONS: The patient responded to the following health history questions as indicated:    1. Are you having any GI issues? no 2. Do you have a personal history of Polyps? no 3. Do you have a family history of Colon Cancer or Polyps? no 4. Diabetes Mellitus? Metformin and Semaglutide  5. Joint replacements in the past 12 months?no 6. Major health problems in the past 3 months?no 7. Any artificial heart valves, MVP, or defibrillator?no    MEDICATIONS & ALLERGIES:    Patient reports the following regarding taking any anticoagulation/antiplatelet therapy:   Plavix, Coumadin, Eliquis, Xarelto, Lovenox, Pradaxa, Brilinta, or Effient? no Aspirin? '81mg'$    Patient confirms/reports the following medications:  Current Outpatient Medications  Medication Sig Dispense Refill   aspirin EC 81 MG tablet Take 1 tablet (81 mg total) by mouth daily. Swallow whole. 90 tablet 3   atorvastatin (LIPITOR) 40 MG tablet Take 1 tablet (40 mg total) by mouth daily. 90 tablet 1   cyclobenzaprine (FLEXERIL) 10 MG tablet Take 1 tablet (10 mg total) by mouth at bedtime. 90 tablet 1   diclofenac Sodium (VOLTAREN) 1 % GEL Apply 4 g topically 4 (four) times daily. 350 g 3   Guselkumab (TREMFYA [place]) Inject 1 Dose into the skin as directed. Every 8 weeks after starter doses.     LORazepam (ATIVAN) 0.5 MG tablet Take 1 tablet (0.5 mg total) by mouth 2 (two) times daily as needed for anxiety. 20 tablet 1   metformin (FORTAMET) 1000 MG (OSM) 24 hr tablet Take 1 tablet (1,000 mg total) by mouth 2 (two) times daily with a meal. 180 tablet 1   metoprolol tartrate (LOPRESSOR) 25 MG tablet Take 1 tablet (25 mg total) by mouth 2 (two) times daily. 180 tablet 1   mometasone (NASONEX) 50 MCG/ACT nasal spray Place 2 sprays into the nose daily. 1 each 12   nitroGLYCERIN (NITROSTAT) 0.4 MG SL tablet Place 1 tablet (0.4 mg  total) under the tongue every 5 (five) minutes as needed for chest pain. For a maximum dose of 3 tablets. 30 tablet 1   omeprazole (PRILOSEC) 20 MG capsule Take 1 capsule (20 mg total) by mouth daily. 90 capsule 3   ONE TOUCH ULTRA TEST test strip USE AS DIRECTED ONCE DAILY 100 each 0   Semaglutide, 2 MG/DOSE, 8 MG/3ML SOPN Inject 2 mg as directed once a week. 9 mL 1   No current facility-administered medications for this visit.    Patient confirms/reports the following allergies:  Allergies  Allergen Reactions   Methylprednisolone     Patient states caused dark urine, chills, and tongue felt burnt     No orders of the defined types were placed in this encounter.   AUTHORIZATION INFORMATION Primary Insurance: 1D#: Group #:  Secondary Insurance: 1D#: Group #:  SCHEDULE INFORMATION: Date:  Time: Location:

## 2022-01-08 ENCOUNTER — Ambulatory Visit: Payer: Self-pay

## 2022-01-08 NOTE — Patient Outreach (Signed)
  Care Coordination   Initial Visit Note   01/08/2022 Name: Casey Cole MRN: [id number] DOB: 1976-02-23  Casey Cole is a 46 y.o. year old male who sees Casey Roys, DO for primary care. I spoke with  Casey Cole by phone today.  What matters to the patients health and wellness today?  The patient is concerned about his DM but feels he is doing better since medication changes.     Goals Addressed             This Visit's Progress    RNCM: Effective Management of DM       Care Coordination Interventions:  Lab Results  Component Value Date   HGBA1C 8.5 (H) 12/24/2021    Provided education to patient about basic DM disease process. The patient feels his DM is better controlled at this time. He feels like when he goes back to see Dr. Wynetta Cole in November his A1C will be better. He had not been taking his medications as directed and was forgetting at times. Reviewed medications with patient and discussed importance of medication adherence. Education on taking medications as directed. The patient is now taking Ozempic 2 mg and feels this will help bring his A1C down. The patient states that he is compliant with medications now.  Counseled on importance of regular laboratory monitoring as prescribed. Next labwork will be in November Discussed plans with patient for ongoing care management follow up and provided patient with direct contact information for care management team Reviewed scheduled/upcoming provider appointments including: 03-26-2022 Advised patient, providing education and rationale, to check cbg as directed  and record, calling pcp for findings outside established parameters Review of patient status, including review of consultants reports, relevant laboratory and other test results, and medications completed Screening for signs and symptoms of depression related to chronic disease state  Assessed social determinant of health barriers           SDOH assessments and  interventions completed:  Yes  SDOH Interventions Today    Flowsheet Row Most Recent Value  SDOH Interventions   Food Insecurity Interventions Intervention Not Indicated  Housing Interventions Intervention Not Indicated  Transportation Interventions Intervention Not Indicated  Utilities Interventions Intervention Not Indicated        Care Coordination Interventions Activated:  Yes  Care Coordination Interventions:  Yes, provided   Follow up plan: No further intervention required.   Encounter Outcome:  Pt. Visit Completed   Casey Larsson RN, MSN, [facility] Mobile: [phone]

## 2022-01-08 NOTE — Patient Instructions (Signed)
Visit Information  Thank you for taking time to visit with me today. Please don't hesitate to contact me if I can be of assistance to you.   Following are the goals we discussed today:   Goals Addressed             This Visit's Progress    COMPLETED: RNCM: Effective Management of DM       Care Coordination Interventions:  Lab Results  Component Value Date   HGBA1C 8.5 (H) 12/24/2021    Provided education to patient about basic DM disease process. The patient feels his DM is better controlled at this time. He feels like when he goes back to see Dr. Wynetta Emery in November his A1C will be better. He had not been taking his medications as directed and was forgetting at times. Reviewed medications with patient and discussed importance of medication adherence. Education on taking medications as directed. The patient is now taking Ozempic 2 mg and feels this will help bring his A1C down. The patient states that he is compliant with medications now.  Counseled on importance of regular laboratory monitoring as prescribed. Next labwork will be in November Discussed plans with patient for ongoing care management follow up and provided patient with direct contact information for care management team Reviewed scheduled/upcoming provider appointments including: 03-26-2022 Advised patient, providing education and rationale, to check cbg as directed  and record, calling pcp for findings outside established parameters Review of patient status, including review of consultants reports, relevant laboratory and other test results, and medications completed Screening for signs and symptoms of depression related to chronic disease state  Assessed social determinant of health barriers             Please call the care guide team at [phone] if you need to schedule an appointment.   If you are experiencing a Mental Health or [place] or need someone to talk to, please call the Suicide  and Crisis Lifeline: 988 call the [place] National Suicide Prevention Lifeline: 860-581-0338 or TTY: 9863558305 TTY 682 496 4380) to talk to a trained counselor call 1-800-273-TALK (toll free, 24 hour hotline)  Patient verbalizes understanding of instructions and care plan provided today and agrees to view in [place]. Active MyChart status and patient understanding of how to access instructions and care plan via MyChart confirmed with patient.     No further follow up required: the patient knows how to reach the [facility] for new needs. Denies any needs at this time.    Noreene Larsson RN, MSN, CCM Community Care Coordinator [facility] Mobile: [phone]

## 2022-01-16 ENCOUNTER — Encounter: Payer: Self-pay | Admitting: Gastroenterology

## 2022-01-17 ENCOUNTER — Encounter
Admission: RE | Disposition: A | Discharge status: Home / Self Care | Payer: Self-pay | Source: Home / Self Care | Attending: Gastroenterology

## 2022-01-17 ENCOUNTER — Ambulatory Visit
Admission: RE | Admit: 2022-01-17 | Discharge: 2022-01-17 | Disposition: A | Discharge status: Home / Self Care | Payer: BC Managed Care – PPO | Attending: Gastroenterology | Admitting: Gastroenterology

## 2022-01-17 ENCOUNTER — Ambulatory Visit: Payer: BC Managed Care – PPO | Admitting: Certified Registered"

## 2022-01-17 ENCOUNTER — Encounter: Payer: Self-pay | Admitting: Gastroenterology

## 2022-01-17 DIAGNOSIS — Z87891 Personal history of nicotine dependence: Secondary | ICD-10-CM | POA: Diagnosis not present

## 2022-01-17 DIAGNOSIS — E785 Hyperlipidemia, unspecified: Secondary | ICD-10-CM | POA: Insufficient documentation

## 2022-01-17 DIAGNOSIS — Z7984 Long term (current) use of oral hypoglycemic drugs: Secondary | ICD-10-CM | POA: Diagnosis not present

## 2022-01-17 DIAGNOSIS — Z1211 Encounter for screening for malignant neoplasm of colon: Secondary | ICD-10-CM | POA: Diagnosis not present

## 2022-01-17 DIAGNOSIS — K635 Polyp of colon: Secondary | ICD-10-CM | POA: Diagnosis not present

## 2022-01-17 DIAGNOSIS — D128 Benign neoplasm of rectum: Secondary | ICD-10-CM | POA: Diagnosis not present

## 2022-01-17 DIAGNOSIS — E119 Type 2 diabetes mellitus without complications: Secondary | ICD-10-CM | POA: Insufficient documentation

## 2022-01-17 DIAGNOSIS — I2510 Atherosclerotic heart disease of native coronary artery without angina pectoris: Secondary | ICD-10-CM | POA: Insufficient documentation

## 2022-01-17 DIAGNOSIS — G4730 Sleep apnea, unspecified: Secondary | ICD-10-CM | POA: Insufficient documentation

## 2022-01-17 DIAGNOSIS — Z6835 Body mass index (BMI) 35.0-35.9, adult: Secondary | ICD-10-CM | POA: Diagnosis not present

## 2022-01-17 DIAGNOSIS — I10 Essential (primary) hypertension: Secondary | ICD-10-CM | POA: Insufficient documentation

## 2022-01-17 DIAGNOSIS — D126 Benign neoplasm of colon, unspecified: Secondary | ICD-10-CM | POA: Diagnosis not present

## 2022-01-17 DIAGNOSIS — K621 Rectal polyp: Secondary | ICD-10-CM | POA: Insufficient documentation

## 2022-01-17 DIAGNOSIS — E669 Obesity, unspecified: Secondary | ICD-10-CM | POA: Diagnosis not present

## 2022-01-17 DIAGNOSIS — Z9049 Acquired absence of other specified parts of digestive tract: Secondary | ICD-10-CM | POA: Insufficient documentation

## 2022-01-17 DIAGNOSIS — D122 Benign neoplasm of ascending colon: Secondary | ICD-10-CM | POA: Insufficient documentation

## 2022-01-17 HISTORY — PX: COLONOSCOPY WITH PROPOFOL: SHX5780

## 2022-01-17 LAB — GLUCOSE, CAPILLARY: Glucose-Capillary: 212 mg/dL — ABNORMAL HIGH (ref 70–99)

## 2022-01-17 SURGERY — COLONOSCOPY WITH PROPOFOL
Anesthesia: General

## 2022-01-17 MED ORDER — LIDOCAINE HCL (CARDIAC) PF 100 MG/5ML IV SOSY
PREFILLED_SYRINGE | INTRAVENOUS | Status: DC | PRN
  Administered 2022-01-17: 50 mg via INTRAVENOUS

## 2022-01-17 MED ORDER — STERILE WATER FOR IRRIGATION IR SOLN
Status: DC | PRN
  Administered 2022-01-17: 150 mL

## 2022-01-17 MED ORDER — PROPOFOL 500 MG/50ML IV EMUL
INTRAVENOUS | Status: DC | PRN
  Administered 2022-01-17: 70 ug via INTRAVENOUS
  Administered 2022-01-17: 150 ug/kg/min via INTRAVENOUS

## 2022-01-17 MED ORDER — SODIUM CHLORIDE 0.9 % IV SOLN
INTRAVENOUS | Status: DC
  Administered 2022-01-17: 1000 mL via INTRAVENOUS

## 2022-01-17 NOTE — Transfer of Care (Signed)
Immediate Anesthesia Transfer of Care Note  Patient: Casey Cole  Procedure(s) Performed: COLONOSCOPY WITH PROPOFOL  Patient Location: PACU and Endoscopy Unit  Anesthesia Type:General  Level of Consciousness: drowsy  Airway & Oxygen Therapy: Patient Spontanous Breathing  Post-op Assessment: Report given to RN and Post -op Vital signs reviewed and stable  Post vital signs: Reviewed and stable  Last Vitals:  Vitals Value Taken Time  BP    Temp    Pulse    Resp    SpO2      Last Pain:  Vitals:   01/17/22 0750  TempSrc: Temporal  PainSc: 0-No pain         Complications: No notable events documented.

## 2022-01-17 NOTE — Anesthesia Postprocedure Evaluation (Signed)
Anesthesia Post Note  Patient: Casey Cole  Procedure(s) Performed: COLONOSCOPY WITH PROPOFOL  Patient location during evaluation: PACU Anesthesia Type: General Level of consciousness: awake and awake and alert Pain management: satisfactory to patient Vital Signs Assessment: post-procedure vital signs reviewed and stable Respiratory status: spontaneous breathing and respiratory function stable Cardiovascular status: stable Anesthetic complications: no   No notable events documented.   Last Vitals:  Vitals:   01/17/22 0912 01/17/22 0922  BP: (!) 128/93 109/79  Pulse: 93 91  Resp: 17 18  Temp: (!) 35.9 C   SpO2: 99% 100%    Last Pain:  Vitals:   01/17/22 0912  TempSrc: Tympanic  PainSc: 0-No pain                 VAN STAVEREN,Areya Lemmerman

## 2022-01-17 NOTE — Anesthesia Procedure Notes (Signed)
Procedure Name: MAC Date/Time: 01/17/2022 8:35 AM  Performed by: Biagio Borg, CRNAPre-anesthesia Checklist: Patient identified, Emergency Drugs available, Suction available, Patient being monitored and Timeout performed Patient Re-evaluated:Patient Re-evaluated prior to induction Oxygen Delivery Method: Nasal cannula Induction Type: IV induction Placement Confirmation: positive ETCO2 and CO2 detector

## 2022-01-17 NOTE — Anesthesia Preprocedure Evaluation (Signed)
Anesthesia Evaluation  Patient identified by MRN, date of birth, ID band Patient awake    Reviewed: Allergy & Precautions, NPO status , Patient's Chart, lab work & pertinent test results  Airway Mallampati: II  TM Distance: >3 FB Neck ROM: full    Dental  (+) Teeth Intact   Pulmonary neg pulmonary ROS, sleep apnea , Patient abstained from smoking., former smoker,    Pulmonary exam normal  + decreased breath sounds      Cardiovascular Exercise Tolerance: Good hypertension, Pt. on medications + CAD  negative cardio ROS Normal cardiovascular exam Rhythm:Regular Rate:Normal     Neuro/Psych Anxiety Depression negative neurological ROS  negative psych ROS   GI/Hepatic negative GI ROS, Neg liver ROS,   Endo/Other  negative endocrine ROSdiabetes, Well Controlled, Type 2, Oral Hypoglycemic Agents  Renal/GU negative Renal ROS  negative genitourinary   Musculoskeletal   Abdominal (+) + obese,   Peds negative pediatric ROS (+)  Hematology negative hematology ROS (+)   Anesthesia Other Findings Past Medical History: 08/23/2015: Abscess of superficial perineal space 10/27/2015: Depression No date: Diabetes mellitus without complication (HCC) No date: Hyperlipidemia No date: Hypertension 10/26/2018: Recurrent major depressive disorder, in full remission  ([place]) No date: Sleep apnea  Past Surgical History: No date: CHOLECYSTECTOMY 07/17/2021: CORONARY PRESSURE WIRE/FFR WITH 3D MAPPING; N/A     Comment:  Procedure: Coronary Pressure Wire/FFR w/3D Mapping;                Surgeon: Nelva Bush, MD;  Location: [facility]               CV LAB;  Service: Cardiovascular;  Laterality: N/A; 07-12-15: INCISE AND DRAIN ABCESS; Left     Comment:  left buttock 08/23/2015: INCISION AND DRAINAGE PERIRECTAL ABSCESS; N/A     Comment:  Procedure: IRRIGATION AND DEBRIDEMENT PERIRECTAL               ABSCESS;  Surgeon: Robert Bellow,  MD;  Location: [facility]              ORS;  Service: General;  Laterality: N/A;  Anal Scope 07/17/2021: LEFT HEART CATH AND CORONARY ANGIOGRAPHY; Left     Comment:  Procedure: LEFT HEART CATH AND CORONARY ANGIOGRAPHY;                Surgeon: Nelva Bush, MD;  Location: [facility]               CV LAB;  Service: Cardiovascular;  Laterality: Left; No date: VASECTOMY  BMI    Body Mass Index: 35.80 kg/m      Reproductive/Obstetrics negative OB ROS                             Anesthesia Physical Anesthesia Plan  ASA: 3  Anesthesia Plan: General   Post-op Pain Management:    Induction: Intravenous  PONV Risk Score and Plan: Propofol infusion and TIVA  Airway Management Planned: Natural Airway  Additional Equipment:   Intra-op Plan:   Post-operative Plan:   Informed Consent: I have reviewed the patients History and Physical, chart, labs and discussed the procedure including the risks, benefits and alternatives for the proposed anesthesia with the patient or authorized representative who has indicated his/her understanding and acceptance.     Dental Advisory Given  Plan Discussed with: CRNA and Surgeon  Anesthesia Plan Comments:         Anesthesia Quick Evaluation

## 2022-01-17 NOTE — Op Note (Signed)
[facility] Patient Name: Casey Cole Procedure Date: 01/17/2022 8:38 AM MRN: [id number] Account #: [id number] Date of Birth: 11/23/1975 Admit Type: Outpatient Age: 46 Room: [facility] ENDO ROOM 4 Gender: Male Note Status: Finalized Instrument Name: Jasper Riling [id number] Procedure:             Colonoscopy Indications:           Screening for colorectal malignant neoplasm Providers:             Jonathon Bellows MD, MD Referring MD:          Valerie Roys (Referring MD) Medicines:             Monitored Anesthesia Care Complications:         No immediate complications. Procedure:             Pre-Anesthesia Assessment:                        - Prior to the procedure, a History and Physical was                         performed, and patient medications, allergies and                         sensitivities were reviewed. The patient's tolerance                         of previous anesthesia was reviewed.                        - The risks and benefits of the procedure and the                         sedation options and risks were discussed with the                         patient. All questions were answered and informed                         consent was obtained.                        - ASA Grade Assessment: II - A patient with mild                         systemic disease.                        After obtaining informed consent, the colonoscope was                         passed under direct vision. Throughout the procedure,                         the patient's blood pressure, pulse, and oxygen                         saturations were monitored continuously. The                         Colonoscope was introduced  through the anus and                         advanced to the the cecum, identified by the                         appendiceal orifice. The colonoscopy was performed                         with ease. The patient tolerated the procedure well.                          The quality of the bowel preparation was excellent. Findings:      The perianal and digital rectal examinations were normal.      Five sessile polyps were found in the ascending colon. The polyps were 7       to 9 mm in size. These polyps were removed with a cold snare. Resection       and retrieval were complete.      Two sessile polyps were found in the ascending colon. The polyps were 10       to 13 mm in size. These polyps were removed with a hot snare. Resection       and retrieval were complete.      A 7 mm polyp was found in the sigmoid colon. The polyp was sessile. The       polyp was removed with a cold snare. Resection and retrieval were       complete.      A 12 mm polyp was found in the sigmoid colon. The polyp was       semi-pedunculated. The polyp was removed with a hot snare. Resection and       retrieval were complete.      Three semi-pedunculated polyps were found in the rectum. The polyps were       12 to 15 mm in size. These polyps were removed with a hot snare.       Resection and retrieval were complete.      The exam was otherwise without abnormality on direct and retroflexion       views. Impression:            - Five 7 to 9 mm polyps in the ascending colon,                         removed with a cold snare. Resected and retrieved.                        - Two 10 to 13 mm polyps in the ascending colon,                         removed with a hot snare. Resected and retrieved.                        - One 7 mm polyp in the sigmoid colon, removed with a                         cold snare. Resected and retrieved.                        -  One 12 mm polyp in the sigmoid colon, removed with a                         hot snare. Resected and retrieved.                        - Three 12 to 15 mm polyps in the rectum, removed with                         a hot snare. Resected and retrieved.                        - The examination was otherwise normal on  direct and                         retroflexion views. Recommendation:        - Discharge patient to home (with escort).                        - Resume previous diet.                        - Continue present medications.                        - Await pathology results.                        - Repeat colonoscopy in 1 year for surveillance. Procedure Code(s):     --- Professional ---                        [phone], Colonoscopy, flexible; with removal of                         tumor(s), polyp(s), or other lesion(s) by snare                         technique Diagnosis Code(s):     --- Professional ---                        Z12.11, Encounter for screening for malignant neoplasm                         of colon                        K63.5, Polyp of colon                        K62.1, Rectal polyp CPT copyright 2019 American Medical Association. All rights reserved. The codes documented in this report are preliminary and upon coder review may  be revised to meet current compliance requirements. Jonathon Bellows, MD Jonathon Bellows MD, MD 01/17/2022 9:03:39 AM This report has been signed electronically. Number of Addenda: 0 Note Initiated On: 01/17/2022 8:38 AM Scope Withdrawal Time: 0 hours 16 minutes 21 seconds  Total Procedure Duration: 0 hours 18 minutes 11 seconds  Estimated Blood Loss:  Estimated blood loss: none.      [facility]

## 2022-01-17 NOTE — H&P (Signed)
Jonathon Bellows, MD [address], [place], [place], [place], [address] [address], [place], [place], [place], [address] Phone: [phone]  Fax: [phone]  Primary Care Physician:  Valerie Roys, DO   Pre-Procedure History & Physical: HPI:  Casey Cole is a 46 y.o. male is here for an colonoscopy.   Past Medical History:  Diagnosis Date   Abscess of superficial perineal space 08/23/2015   Depression 10/27/2015   Diabetes mellitus without complication ([place])    Hyperlipidemia    Hypertension    Recurrent major depressive disorder, in full remission ([place]) 10/26/2018   Sleep apnea     Past Surgical History:  Procedure Laterality Date   CHOLECYSTECTOMY     CORONARY PRESSURE WIRE/FFR WITH 3D MAPPING N/A 07/17/2021   Procedure: Coronary Pressure Wire/FFR w/3D Mapping;  Surgeon: Nelva Bush, MD;  Location: [facility] CV LAB;  Service: Cardiovascular;  Laterality: N/A;   INCISE AND DRAIN ABCESS Left 07-12-15   left buttock   INCISION AND DRAINAGE PERIRECTAL ABSCESS N/A 08/23/2015   Procedure: IRRIGATION AND DEBRIDEMENT PERIRECTAL ABSCESS;  Surgeon: Robert Bellow, MD;  Location: [facility] ORS;  Service: General;  Laterality: N/A;  Anal Scope   LEFT HEART CATH AND CORONARY ANGIOGRAPHY Left 07/17/2021   Procedure: LEFT HEART CATH AND CORONARY ANGIOGRAPHY;  Surgeon: Nelva Bush, MD;  Location: [facility] CV LAB;  Service: Cardiovascular;  Laterality: Left;   VASECTOMY      Prior to Admission medications   Medication Sig Start Date End Date Taking? Authorizing Provider  aspirin EC 81 MG tablet Take 1 tablet (81 mg total) by mouth daily. Swallow whole. 07/13/21  Yes Agbor-Etang, Aaron Edelman, MD  atorvastatin (LIPITOR) 40 MG tablet Take 1 tablet (40 mg total) by mouth daily. 09/14/21  Yes Agbor-Etang, Aaron Edelman, MD  metformin (FORTAMET) 1000 MG (OSM) 24 hr tablet Take 1 tablet (1,000 mg total) by mouth 2 (two) times daily with a meal. 09/17/21  Yes Johnson, Megan P, DO  metoprolol  tartrate (LOPRESSOR) 25 MG tablet Take 1 tablet (25 mg total) by mouth 2 (two) times daily. 09/17/21 09/17/22 Yes Johnson, Megan P, DO  omeprazole (PRILOSEC) 20 MG capsule Take 1 capsule (20 mg total) by mouth daily. 09/17/21  Yes Johnson, Megan P, DO  ONE TOUCH ULTRA TEST test strip USE AS DIRECTED ONCE DAILY 02/10/18  Yes Johnson, Megan P, DO  Semaglutide, 2 MG/DOSE, 8 MG/3ML SOPN Inject 2 mg as directed once a week. 12/24/21  Yes Johnson, Megan P, DO  cyclobenzaprine (FLEXERIL) 10 MG tablet Take 1 tablet (10 mg total) by mouth at bedtime. 07/24/21   Park Liter P, DO  diclofenac Sodium (VOLTAREN) 1 % GEL Apply 4 g topically 4 (four) times daily. 07/24/21   Johnson, Megan P, DO  Guselkumab (TREMFYA [place]) Inject 1 Dose into the skin as directed. Every 8 weeks after starter doses.    [provider]  LORazepam (ATIVAN) 0.5 MG tablet Take 1 tablet (0.5 mg total) by mouth 2 (two) times daily as needed for anxiety. 09/13/21   Johnson, Megan P, DO  mometasone (NASONEX) 50 MCG/ACT nasal spray Place 2 sprays into the nose daily. 08/23/21   Dohmeier, Asencion Partridge, MD  nitroGLYCERIN (NITROSTAT) 0.4 MG SL tablet Place 1 tablet (0.4 mg total) under the tongue every 5 (five) minutes as needed for chest pain. For a maximum dose of 3 tablets. 07/13/21 10/11/21  Kate Sable, MD    Allergies as of 12/25/2021 - Review Complete 12/24/2021  Allergen  Reaction Noted   Methylprednisolone  07/14/2020    Family History  Problem Relation Age of Onset   Hypertension Father    Stroke Paternal Grandfather    Heart attack Paternal Grandfather     Social History   Socioeconomic History   Marital status: Married    Spouse name: Not on file   Number of children: Not on file   Years of education: Not on file   Highest education level: Not on file  Occupational History   Not on file  Tobacco Use   Smoking status: Former    Packs/day: 1.00    Types: Cigarettes    Quit date: 07/14/2021    Years since quitting: 0.5    Smokeless tobacco: Never  Vaping Use   Vaping Use: Never used  Substance and Sexual Activity   Alcohol use: Not Currently   Drug use: No   Sexual activity: Yes  Other Topics Concern   Not on file  Social History Narrative   Caffeine use: 2-3 per day (soda)   Social Determinants of Health   Financial Resource Strain: Not on file  Food Insecurity: No Food Insecurity (01/08/2022)   Hunger Vital Sign    Worried About Running Out of Food in the Last Year: Never true    Ran Out of Food in the Last Year: Never true  Transportation Needs: No Transportation Needs (01/08/2022)   PRAPARE - Hydrologist (Medical): No    Lack of Transportation (Non-Medical): No  Physical Activity: Not on file  Stress: Not on file  Social Connections: Not on file  Intimate Partner Violence: Not on file    Review of Systems: See HPI, otherwise negative ROS  Physical Exam: BP 128/87   Pulse 99   Temp (!) 96.3 F (35.7 C) (Temporal)   Resp 18   Ht '5\' 8"'$  (1.727 m)   Wt 106.8 kg   SpO2 99%   BMI 35.80 kg/m  General:   Alert,  pleasant and cooperative in NAD Head:  Normocephalic and atraumatic. Neck:  Supple; no masses or thyromegaly. Lungs:  Clear throughout to auscultation, normal respiratory effort.    Heart:  +S1, +S2, Regular rate and rhythm, No edema. Abdomen:  Soft, nontender and nondistended. Normal bowel sounds, without guarding, and without rebound.   Neurologic:  Alert and  oriented x4;  grossly normal neurologically.  Impression/Plan: [name] is here for an colonoscopy to be performed for Screening colonoscopy average risk   Risks, benefits, limitations, and alternatives regarding  colonoscopy have been reviewed with the patient.  Questions have been answered.  All parties agreeable.   Jonathon Bellows, MD  01/17/2022, 8:25 AM

## 2022-01-18 LAB — SURGICAL PATHOLOGY

## 2022-01-21 ENCOUNTER — Encounter: Payer: Self-pay | Admitting: Gastroenterology

## 2022-01-23 ENCOUNTER — Ambulatory Visit: Payer: BC Managed Care – PPO | Admitting: Physician Assistant

## 2022-01-23 ENCOUNTER — Encounter: Payer: Self-pay | Admitting: Physician Assistant

## 2022-01-23 VITALS — BP 106/74 | HR 108 | Temp 98.8°F | Ht 67.99 in | Wt 239.0 lb

## 2022-01-23 DIAGNOSIS — J069 Acute upper respiratory infection, unspecified: Secondary | ICD-10-CM

## 2022-01-23 LAB — VERITOR FLU A/B WAIVED
Influenza A: NEGATIVE
Influenza B: NEGATIVE

## 2022-01-23 MED ORDER — BENZONATATE 200 MG PO CAPS: 200.0000 mg | ORAL_CAPSULE | Freq: Two times a day (BID) | ORAL | 0 refills | Status: DC | PRN

## 2022-01-23 NOTE — Progress Notes (Signed)
Acute Office Visit   Patient: Casey Cole   DOB: 16-Jan-1976   46 y.o. Male  MRN: [id number] Visit Date: 01/23/2022  Today's healthcare provider: Dani Gobble Jacqulene Huntley, PA-C  Introduced myself to the patient as a Journalist, newspaper and provided education on APPs in clinical practice.    Chief Complaint  Patient presents with   Cough    Runny nose, sneezing, cold sweats today and a slight fever.. Started on Sunday, started taking dayquil that seemed to help a little. Patient states that wife had this last week and gave it to him    Subjective    Cough Associated symptoms include chills, a fever, headaches, postnasal drip, rhinorrhea, a sore throat, shortness of breath and wheezing. Pertinent negatives include no ear pain or myalgias.   HPI     Cough    Additional comments: Runny nose, sneezing, cold sweats today and a slight fever.. Started on Sunday, started taking dayquil that seemed to help a little. Patient states that wife had this last week and gave it to him       Last edited by Jerelene Redden, CMA on 01/23/2022 11:28 AM.       URI   States this started on Sunday  Reports wife was recently sick with similar symptoms Reports productive cough, subjective fever- see ROS for further symptoms Interventions: Dayquil  Alleviating: Dayquil helped a bit but still feeling rather "rough:  He has not tested for COVID- his wife tested at home and in medical office and both were negative    Medications: Outpatient Medications Prior to Visit  Medication Sig   aspirin EC 81 MG tablet Take 1 tablet (81 mg total) by mouth daily. Swallow whole.   atorvastatin (LIPITOR) 40 MG tablet Take 1 tablet (40 mg total) by mouth daily.   cyclobenzaprine (FLEXERIL) 10 MG tablet Take 1 tablet (10 mg total) by mouth at bedtime.   diclofenac Sodium (VOLTAREN) 1 % GEL Apply 4 g topically 4 (four) times daily.   Guselkumab (TREMFYA [place]) Inject 1 Dose into the skin as directed. Every 8 weeks after starter  doses.   LORazepam (ATIVAN) 0.5 MG tablet Take 1 tablet (0.5 mg total) by mouth 2 (two) times daily as needed for anxiety.   metformin (FORTAMET) 1000 MG (OSM) 24 hr tablet Take 1 tablet (1,000 mg total) by mouth 2 (two) times daily with a meal.   metoprolol tartrate (LOPRESSOR) 25 MG tablet Take 1 tablet (25 mg total) by mouth 2 (two) times daily.   mometasone (NASONEX) 50 MCG/ACT nasal spray Place 2 sprays into the nose daily.   omeprazole (PRILOSEC) 20 MG capsule Take 1 capsule (20 mg total) by mouth daily.   ONE TOUCH ULTRA TEST test strip USE AS DIRECTED ONCE DAILY   Semaglutide, 2 MG/DOSE, 8 MG/3ML SOPN Inject 2 mg as directed once a week.   nitroGLYCERIN (NITROSTAT) 0.4 MG SL tablet Place 1 tablet (0.4 mg total) under the tongue every 5 (five) minutes as needed for chest pain. For a maximum dose of 3 tablets.   No facility-administered medications prior to visit.    Review of Systems  Constitutional:  Positive for chills, fatigue and fever. Negative for diaphoresis.  HENT:  Positive for congestion, postnasal drip, rhinorrhea, sinus pressure, sinus pain, sneezing and sore throat. Negative for ear pain.   Respiratory:  Positive for cough, shortness of breath and wheezing. Negative for chest tightness.   Gastrointestinal:  Positive for  nausea. Negative for diarrhea and vomiting.  Musculoskeletal:  Negative for myalgias.  Neurological:  Positive for light-headedness and headaches. Negative for dizziness.       Objective    BP 106/74   Pulse (!) 108   Temp 98.8 F (37.1 C) (Oral)   Ht 5' 7.99" (1.727 m)   Wt 239 lb (108.4 kg)   SpO2 95%   BMI 36.35 kg/m    Physical Exam Vitals reviewed.  Constitutional:      General: He is awake.     Appearance: Normal appearance. He is well-developed and well-groomed. He is obese.  HENT:     Head: Normocephalic and atraumatic.     Right Ear: Ear canal and external ear normal. There is impacted cerumen.     Left Ear: Hearing, ear canal  and external ear normal. A middle ear effusion is present.     Nose: Congestion present.     Mouth/Throat:     Lips: Pink.     Mouth: Mucous membranes are moist.     Pharynx: Uvula midline. Posterior oropharyngeal erythema present. No pharyngeal swelling, oropharyngeal exudate or uvula swelling.     Tonsils: No tonsillar exudate or tonsillar abscesses.  Neck:     Thyroid: No thyroid mass, thyromegaly or thyroid tenderness.     Trachea: Phonation normal.  Cardiovascular:     Rate and Rhythm: Normal rate and regular rhythm.     Pulses: Normal pulses.          Radial pulses are 2+ on the right side and 2+ on the left side.     Heart sounds: Normal heart sounds.  Pulmonary:     Effort: Pulmonary effort is normal.     Breath sounds: Normal breath sounds. No decreased air movement. No decreased breath sounds, wheezing, rhonchi or rales.  Musculoskeletal:     Cervical back: Normal range of motion and neck supple. No crepitus. No pain with movement.  Lymphadenopathy:     Head:     Right side of head: No submental, submandibular or preauricular adenopathy.     Left side of head: No submental, submandibular or preauricular adenopathy.     Upper Body:     Right upper body: No supraclavicular adenopathy.     Left upper body: No supraclavicular adenopathy.  Neurological:     General: No focal deficit present.     Mental Status: He is alert and oriented to person, place, and time.     GCS: GCS eye subscore is 4. GCS verbal subscore is 5. GCS motor subscore is 6.  Psychiatric:        Behavior: Behavior is cooperative.       No results found for any visits on 01/23/22.  Assessment & Plan      No follow-ups on file.      Problem List Items Addressed This Visit   None Visit Diagnoses     Viral upper respiratory tract infection    -  Primary Visit with patient indicates symptoms comprised of congestion, productive cough, sore throat, headaches, nausea since Sunday congruent with acute  URI that is likely viral in nature  Will test for COVID and flu today in office as patient is still in window for antivirals  Will send in script for tessalon perls to assist with coughing  Due to nature and duration of symptoms recommended treatment regimen is symptomatic relief and follow up if needed Discussed with patient the various viral and bacterial etiologies of current  illness and appropriate course of treatment Discussed OTC medication options for multisymptom relief such as Dayquil/Nyquil, Theraflu, AlkaSeltzer, etc. Discussed return precautions if symptoms are not improving or worsen over next 5-7 days.     Relevant Medications   benzonatate (TESSALON) 200 MG capsule   Other Relevant Orders   Novel Coronavirus, NAA ([facility])   Veritor Flu A/B Waived        No follow-ups on file.   I, Laniqua Torrens E Annalynne Ibanez, PA-C, have reviewed all documentation for this visit. The documentation on 01/23/22 for the exam, diagnosis, procedures, and orders are all accurate and complete.   Talitha Givens, MHS, PA-C [facility]

## 2022-01-23 NOTE — Patient Instructions (Signed)
Based on your described symptoms and the duration of symptoms it is likely that you have a viral upper respiratory infection (often called a "cold")  Symptoms can last for 3-10 days with lingering cough and intermittent symptoms lasting weeks after that.  The goal of treatment at this time is to reduce your symptoms and discomfort   I have sent in Tessalon pearls for you to take twice per day to help with your cough  You can use over the counter medications such as Dayquil/Nyquil, AlkaSeltzer formulations, etc to provide further relief of symptoms according to the manufacturer's instructions  If preferred you can use Coricidin to manage your symptoms rather than those medications mentioned above.    If your symptoms do not improve or become worse in the next 5-7 days please make an apt at the office so we can see you  Go to the ER if you begin to have more serious symptoms such as shortness of breath, trouble breathing, loss of consciousness, swelling around the eyes, high fever, severe lasting headaches, vision changes or neck pain/stiffness.

## 2022-01-25 LAB — NOVEL CORONAVIRUS, NAA: SARS-CoV-2, NAA: NOT DETECTED

## 2022-01-25 NOTE — Progress Notes (Signed)
COVID negative.

## 2022-01-29 ENCOUNTER — Ambulatory Visit
Admission: EM | Admit: 2022-01-29 | Discharge: 2022-01-29 | Disposition: A | Discharge status: Home / Self Care | Payer: BC Managed Care – PPO | Attending: Urgent Care | Admitting: Urgent Care

## 2022-01-29 ENCOUNTER — Ambulatory Visit: Payer: Self-pay

## 2022-01-29 DIAGNOSIS — R051 Acute cough: Secondary | ICD-10-CM | POA: Diagnosis not present

## 2022-01-29 DIAGNOSIS — B9689 Other specified bacterial agents as the cause of diseases classified elsewhere: Secondary | ICD-10-CM

## 2022-01-29 DIAGNOSIS — R062 Wheezing: Secondary | ICD-10-CM

## 2022-01-29 DIAGNOSIS — J019 Acute sinusitis, unspecified: Secondary | ICD-10-CM

## 2022-01-29 MED ORDER — HYDROCOD POLI-CHLORPHE POLI ER 10-8 MG/5ML PO SUER: 5.0000 mL | Freq: Two times a day (BID) | ORAL | 0 refills | Status: AC | PRN

## 2022-01-29 MED ORDER — AMOXICILLIN-POT CLAVULANATE 875-125 MG PO TABS: 1.0000 | ORAL_TABLET | Freq: Two times a day (BID) | ORAL | 0 refills | Status: AC

## 2022-01-29 NOTE — Telephone Encounter (Signed)
OK tomorrow 11AM

## 2022-01-29 NOTE — ED Triage Notes (Signed)
Pt. Is c/o a cough, nasal congestion, SOB and sneezing since September 17th. Pt. Was treated @ his primary care lasst week and given medication for cough and he was tested for COVID and the flu which were both negative.

## 2022-01-29 NOTE — ED Provider Notes (Signed)
Roderic Palau    CSN: [id number] Arrival date & time: 01/29/22  1042      History   Chief Complaint Chief Complaint  Patient presents with   Shortness of Breath   Cough   Nasal Congestion    HPI Esaiah Wanless is a 46 y.o. male.    Shortness of Breath Associated symptoms: cough   Cough Associated symptoms: shortness of breath     Presents to urgent care with complaint of symptoms since 9/17.  Seen at [facility] and treated for viral URI. Negative resp-swab.  Presents today with:  Cough sometimes productive. Ribs hurt from coughing. SOB from coughing Nasal congestion Sinus pain/pressure Headache  PMH of DM2.  Past Medical History:  Diagnosis Date   Abscess of superficial perineal space 08/23/2015   Depression 10/27/2015   Diabetes mellitus without complication ([place])    Hyperlipidemia    Hypertension    Recurrent major depressive disorder, in full remission ([place]) 10/26/2018   Sleep apnea     Patient Active Problem List   Diagnosis Date Noted   Screening for colon cancer    Adenomatous polyp of colon    Anxiety 12/24/2021   Excessive daytime sleepiness 09/22/2021   Witnessed episode of apnea 08/23/2021   Snoring 08/23/2021   Class 2 severe obesity due to excess calories with serious comorbidity and body mass index (BMI) of 35.0 to 35.9 in adult [facility]) 08/23/2021   Coronary artery disease involving native coronary artery of native heart with unstable angina pectoris ([place]) 07/17/2021   Right-sided chest pain 06/22/2021   Bilateral carpal tunnel syndrome 05/12/2019   Cervical radiculitis 05/12/2019   Lateral epicondylitis of right elbow 05/12/2019   OSA (obstructive sleep apnea) 10/27/2015   Hyperlipidemia    Benign hypertensive renal disease    DM (diabetes mellitus), type 2 with renal complications [facility])     Past Surgical History:  Procedure Laterality Date   CHOLECYSTECTOMY     COLONOSCOPY WITH PROPOFOL N/A 01/17/2022   Procedure: COLONOSCOPY WITH  PROPOFOL;  Surgeon: Jonathon Bellows, MD;  Location[facility] ENDOSCOPY;  Service: Gastroenterology;  Laterality: N/A;   CORONARY PRESSURE WIRE/FFR WITH 3D MAPPING N/A 07/17/2021   Procedure: Coronary Pressure Wire/FFR w/3D Mapping;  Surgeon: Nelva Bush, MD;  Location: [place] CV LAB;  Service: Cardiovascular;  Laterality: N/A;   INCISE AND DRAIN ABCESS Left 07-12-15   left buttock   INCISION AND DRAINAGE PERIRECTAL ABSCESS N/A 08/23/2015   Procedure: IRRIGATION AND DEBRIDEMENT PERIRECTAL ABSCESS;  Surgeon: Robert Bellow, MD;  Location: [facility] ORS;  Service: General;  Laterality: N/A;  Anal Scope   LEFT HEART CATH AND CORONARY ANGIOGRAPHY Left 07/17/2021   Procedure: LEFT HEART CATH AND CORONARY ANGIOGRAPHY;  Surgeon: Nelva Bush, MD;  Location: [place] CV LAB;  Service: Cardiovascular;  Laterality: Left;   VASECTOMY         Home Medications    Prior to Admission medications   Medication Sig Start Date End Date Taking? Authorizing Provider  aspirin EC 81 MG tablet Take 1 tablet (81 mg total) by mouth daily. Swallow whole. 07/13/21   Kate Sable, MD  atorvastatin (LIPITOR) 40 MG tablet Take 1 tablet (40 mg total) by mouth daily. 09/14/21   Kate Sable, MD  benzonatate (TESSALON) 200 MG capsule Take 1 capsule (200 mg total) by mouth 2 (two) times daily as needed for cough. 01/23/22   Mecum, Erin E, PA-C  cyclobenzaprine (FLEXERIL) 10 MG tablet Take 1 tablet (10 mg total) by mouth at  bedtime. 07/24/21   Park Liter P, DO  diclofenac Sodium (VOLTAREN) 1 % GEL Apply 4 g topically 4 (four) times daily. 07/24/21   Johnson, Megan P, DO  Guselkumab (TREMFYA [place]) Inject 1 Dose into the skin as directed. Every 8 weeks after starter doses.    [provider]  LORazepam (ATIVAN) 0.5 MG tablet Take 1 tablet (0.5 mg total) by mouth 2 (two) times daily as needed for anxiety. 09/13/21   Park Liter P, DO  metformin (FORTAMET) 1000 MG (OSM) 24 hr tablet Take 1 tablet (1,000  mg total) by mouth 2 (two) times daily with a meal. 09/17/21   Johnson, Megan P, DO  metoprolol tartrate (LOPRESSOR) 25 MG tablet Take 1 tablet (25 mg total) by mouth 2 (two) times daily. 09/17/21 09/17/22  Johnson, Megan P, DO  mometasone (NASONEX) 50 MCG/ACT nasal spray Place 2 sprays into the nose daily. 08/23/21   Dohmeier, Asencion Partridge, MD  nitroGLYCERIN (NITROSTAT) 0.4 MG SL tablet Place 1 tablet (0.4 mg total) under the tongue every 5 (five) minutes as needed for chest pain. For a maximum dose of 3 tablets. 07/13/21 10/11/21  Kate Sable, MD  omeprazole (PRILOSEC) 20 MG capsule Take 1 capsule (20 mg total) by mouth daily. 09/17/21   Johnson, Megan P, DO  ONE TOUCH ULTRA TEST test strip USE AS DIRECTED ONCE DAILY 02/10/18   Johnson, Megan P, DO  Semaglutide, 2 MG/DOSE, 8 MG/3ML SOPN Inject 2 mg as directed once a week. 12/24/21   Valerie Roys, DO    Family History Family History  Problem Relation Age of Onset   Hypertension Father    Stroke Paternal Grandfather    Heart attack Paternal Grandfather     Social History Social History   Tobacco Use   Smoking status: Former    Packs/day: 1.00    Types: Cigarettes    Quit date: 07/14/2021    Years since quitting: 0.5   Smokeless tobacco: Never  Vaping Use   Vaping Use: Never used  Substance Use Topics   Alcohol use: Not Currently   Drug use: No     Allergies   Methylprednisolone   Review of Systems Review of Systems  Respiratory:  Positive for cough and shortness of breath.      Physical Exam Triage Vital Signs ED Triage Vitals  Enc Vitals Group     BP 01/29/22 1105 134/89     Pulse Rate 01/29/22 1105 93     Resp 01/29/22 1105 18     Temp 01/29/22 1105 98.1 F (36.7 C)     Temp src --      SpO2 01/29/22 1105 94 %     Weight --      Height --      Head Circumference --      Peak Flow --      Pain Score 01/29/22 1106 6     Pain Loc --      Pain Edu? --      Excl. in [place]? --    No data found.  Updated Vital  Signs BP 134/89   Pulse 93   Temp 98.1 F (36.7 C)   Resp 18   SpO2 94%   Visual Acuity Right Eye Distance:   Left Eye Distance:   Bilateral Distance:    Right Eye Near:   Left Eye Near:    Bilateral Near:     Physical Exam Vitals reviewed.  Constitutional:      General:  He is not in acute distress.    Appearance: He is well-developed. He is ill-appearing.  HENT:     Head: Normocephalic.     Mouth/Throat:     Pharynx: Posterior oropharyngeal erythema present. No oropharyngeal exudate.  Eyes:     Extraocular Movements: Extraocular movements intact.     Pupils: Pupils are equal, round, and reactive to light.  Cardiovascular:     Rate and Rhythm: Normal rate and regular rhythm.  Pulmonary:     Effort: Pulmonary effort is normal.     Breath sounds: Examination of the right-upper field reveals wheezing. Examination of the left-upper field reveals wheezing. Examination of the right-middle field reveals wheezing. Examination of the left-middle field reveals wheezing. Examination of the right-lower field reveals wheezing. Examination of the left-lower field reveals wheezing. Wheezing present.  Musculoskeletal:     Cervical back: Normal range of motion and neck supple.  Lymphadenopathy:     Cervical: No cervical adenopathy.  Skin:    General: Skin is warm and dry.  Neurological:     General: No focal deficit present.     Mental Status: He is alert and oriented to person, place, and time.  Psychiatric:        Mood and Affect: Mood normal.        Behavior: Behavior normal.      UC Treatments / Results  Labs (all labs ordered are listed, but only abnormal results are displayed) Labs Reviewed - No data to display  EKG   Radiology No results found.  Procedures Procedures (including critical care time)  Medications Ordered in UC Medications - No data to display  Initial Impression / Assessment and Plan / UC Course  I have reviewed the triage vital signs and the  nursing notes.  Pertinent labs & imaging results that were available during my care of the patient were reviewed by me and considered in my medical decision making (see chart for details).   Suspect secondary bacterial rhinosinusitis to past viral symptoms.  Will treat with Augmentin x10 days given the severity of his symptoms today.  Concerned with report of shortness of breath.  Wheezing on exam in all lung lobes.  Corticosteroid is contraindicated because of documented intolerance as well as the patient's DM 2.  Will prescribe Tussionex for nighttime use as well as daytime when not working.  Continue to use other OTC medications for symptom control.  Discussed my concerns for the patient's shortness of breath and advised him to present to ED if his symptoms worsen.   Final Clinical Impressions(s) / UC Diagnoses   Final diagnoses:  None   Discharge Instructions   None    ED Prescriptions   None    PDMP not reviewed this encounter.   Rose Phi, [place] 01/29/22 1147

## 2022-01-29 NOTE — Telephone Encounter (Signed)
  Chief Complaint: SOB Symptoms: SOB muscle pain from coughing Frequency: last week Pertinent Negatives: Patient denies chest pain Disposition: '[x]'$ ED /'[]'$ Urgent Care (no appt availability in office) / '[]'$ Appointment(In office/virtual)/ '[]'$  [facility]/ '[]'$ Home Care/ '[]'$ Refused Recommended Disposition /'[]'$  Mobile Bus/ '[]'$  Follow-up with PCP Additional Notes: PT was seen in office last week for upper respiratory infection. Pt now is, and sounds SOB. PT also states that his sides hurt from coughing so much. Hx of CAD, DM.  Reason for Disposition  [1] MODERATE difficulty breathing (e.g., speaks in phrases, SOB even at rest, pulse 100-120) AND [2] NEW-onset or WORSE than normal  Answer Assessment - Initial Assessment Questions 1. RESPIRATORY STATUS: "Describe your breathing?" (e.g., wheezing, shortness of breath, unable to speak, severe coughing)      SOB 2. ONSET: "When did this breathing problem begin?"      Last week 3. PATTERN "Does the difficult breathing come and go, or has it been constant since it started?"      Last few days 4. SEVERITY: "How bad is your breathing?" (e.g., mild, moderate, severe)    - MILD: No SOB at rest, mild SOB with walking, speaks normally in sentences, can lie down, no retractions, pulse < 100.    - MODERATE: SOB at rest, SOB with minimal exertion and prefers to sit, cannot lie down flat, speaks in phrases, mild retractions, audible wheezing, pulse 100-120.    - SEVERE: Very SOB at rest, speaks in single words, struggling to breathe, sitting hunched forward, retractions, pulse > 120      moderate 5. RECURRENT SYMPTOM: "Have you had difficulty breathing before?" If Yes, ask: "When was the last time?" and "What happened that time?"      no 6. CARDIAC HISTORY: "Do you have any history of heart disease?" (e.g., heart attack, angina, bypass surgery, angioplasty)      Yes 7. LUNG HISTORY: "Do you have any history of lung disease?"  (e.g., pulmonary  embolus, asthma, emphysema)     no 8. CAUSE: "What do you think is causing the breathing problem?"      Unsure 9. OTHER SYMPTOMS: "Do you have any other symptoms? (e.g., dizziness, runny nose, cough, chest pain, fever)     Runny nose, coughing  10. O2 SATURATION MONITOR:  "Do you use an oxygen saturation monitor (pulse oximeter) at home?" If Yes, ask: "What is your reading (oxygen level) today?" "What is your usual oxygen saturation reading?" (e.g., 95%)        11. PREGNANCY: "Is there any chance you are pregnant?" "When was your last menstrual period?"       na 12. TRAVEL: "Have you traveled out of the country in the last month?" (e.g., travel history, exposures)  Protocols used: Breathing Difficulty-A-AH

## 2022-01-29 NOTE — Telephone Encounter (Signed)
Called patient to see if he could come in for an appointment. LVM asking him to call back

## 2022-01-29 NOTE — Discharge Instructions (Addendum)
Follow up here or with your primary care if your symptoms worsen or do not improvement within 3 days. Watch for worsening shortness of breath.

## 2022-02-19 ENCOUNTER — Telehealth: Payer: Self-pay | Admitting: Family Medicine

## 2022-02-19 NOTE — Telephone Encounter (Signed)
PT needs signature from provider for Proof Of Physical form.  Put in provider's folder.

## 2022-02-21 NOTE — Telephone Encounter (Signed)
Form signed and notified patient it is ready for pick up.

## 2022-03-01 ENCOUNTER — Other Ambulatory Visit: Payer: Self-pay | Admitting: Internal Medicine

## 2022-03-01 NOTE — Telephone Encounter (Signed)
Please schedule overdue F/U appointment for refills. Last filled by PCP. Thank you!

## 2022-03-14 NOTE — Progress Notes (Signed)
Cardiology Clinic Note   Patient Name: Casey Cole Date of Encounter: 03/15/2022  Primary Care Provider:  Valerie Roys, DO Primary Cardiologist:  Casey Sable, MD  Patient Profile    46 year old male with a history of CAD, hyperlipidemia, diabetes, former smoker x20+ years, obstructive sleep apnea, who presents today for follow-up.  Past Medical History    Past Medical History:  Diagnosis Date   Abscess of superficial perineal space 08/23/2015   Depression 10/27/2015   Diabetes mellitus without complication ([place])    Hyperlipidemia    Hypertension    Recurrent major depressive disorder, in full remission ([place]) 10/26/2018   Sleep apnea    Past Surgical History:  Procedure Laterality Date   CHOLECYSTECTOMY     COLONOSCOPY WITH PROPOFOL N/A 01/17/2022   Procedure: COLONOSCOPY WITH PROPOFOL;  Surgeon: Jonathon Bellows, MD;  Location: [facility] ENDOSCOPY;  Service: Gastroenterology;  Laterality: N/A;   CORONARY PRESSURE WIRE/FFR WITH 3D MAPPING N/A 07/17/2021   Procedure: Coronary Pressure Wire/FFR w/3D Mapping;  Surgeon: Nelva Bush, MD;  Location: [place] [facility];  Service: Cardiovascular;  Laterality: N/A;   INCISE AND DRAIN ABCESS Left 07-12-15   left buttock   INCISION AND DRAINAGE PERIRECTAL ABSCESS N/A 08/23/2015   Procedure: IRRIGATION AND DEBRIDEMENT PERIRECTAL ABSCESS;  Surgeon: Robert Bellow, MD;  Location: [facility] ORS;  Service: General;  Laterality: N/A;  Anal Scope   LEFT HEART CATH AND CORONARY ANGIOGRAPHY Left 07/17/2021   Procedure: LEFT HEART CATH AND CORONARY ANGIOGRAPHY;  Surgeon: Nelva Bush, MD;  Location: [place] [facility];  Service: Cardiovascular;  Laterality: Left;   VASECTOMY      Allergies  Allergies  Allergen Reactions   Methylprednisolone     Patient states caused dark urine, chills, and tongue felt burnt     History of Present Illness    Casey Cole is a 46 year old male with a history of coronary artery disease who  underwent left heart catheterization in 07/2021 which revealed 70-90% stenosis to D1 which was too small for intervention, moderate LAD, OM 2, PDA disease, hyperlipidemia, diabetes, former smoker x20+ years, and obstructive sleep apnea.  He underwent coronary CTA which had a coronary calcium score of 116, normal coronary origin with right dominance, noncalcified plaque causing severe stenosis in the distal RCA/proximal PDA (> 70%), noncalcified plaque causing severe stenosis in the mid left circumflex/ostial OM2 (> 70%), noncalcified plaque causing moderate mid LAD stenosis (50%), CAD-RADS 4 severe stenosis (70-99% or> 50% left main) cardiac catheterization was recommended at that time.  Cardiac catheterization was completed 07/17/2021 which revealed multivessel coronary artery disease.  The most severe stenosis involved a small D1 and acute marginal branches which demonstrated 70-90% narrowing but were too small for PCI.  Moderate disease involving the LAD, OM 2, and RPDA was not hemodynamically significant.  Recommendations were to escalate antianginal therapy adding metoprolol tartrate 25 mg twice daily along with medical therapy and risk factor modification to prevent progression of coronary artery disease.  Echocardiogram completed 07/23/2021 revealed LVEF 60-65%, no regional wall motion abnormalities, G1 DD, without valvular abnormalities noted.  He was last seen in clinic 08/17/2021 without any further episodes of chest pain and tolerating medication changes without any added measures effects.  He had lab work that was ordered in 3 months at that time.  He returns to clinic today stating that he has been doing fairly well with exception from being evaluated by urgent care twice for upper respiratory infections.  He is also  recently was physical in his primary care provider.  Denies any chest pain, chest discomfort, peripheral edema, palpitations, he does have associated shortness of breath and  occasional wheezing due to his upper respiratory infection and states that he continues to keep a cough.  He denies any hospitalizations or emergency department visits.  Home Medications    Current Outpatient Medications  Medication Sig Dispense Refill   nitroGLYCERIN (NITROSTAT) 0.4 MG SL tablet Place 1 tablet (0.4 mg total) under the tongue every 5 (five) minutes as needed for chest pain. For a maximum dose of 3 tablets. 30 tablet 1   aspirin EC 81 MG tablet Take 1 tablet (81 mg total) by mouth daily. Swallow whole. 90 tablet 3   atorvastatin (LIPITOR) 40 MG tablet Take 1 tablet (40 mg total) by mouth daily. 90 tablet 1   benzonatate (TESSALON) 200 MG capsule Take 1 capsule (200 mg total) by mouth 2 (two) times daily as needed for cough. (Patient not taking: Reported on 03/15/2022) 20 capsule 0   cyclobenzaprine (FLEXERIL) 10 MG tablet Take 1 tablet (10 mg total) by mouth at bedtime. (Patient taking differently: Take 10 mg by mouth as needed.) 90 tablet 1   diclofenac Sodium (VOLTAREN) 1 % GEL Apply 4 g topically 4 (four) times daily. (Patient not taking: Reported on 03/15/2022) 350 g 3   Guselkumab (TREMFYA [place]) Inject 1 Dose into the skin as directed. Every 8 weeks after starter doses.     LORazepam (ATIVAN) 0.5 MG tablet Take 1 tablet (0.5 mg total) by mouth 2 (two) times daily as needed for anxiety. (Patient not taking: Reported on 03/15/2022) 20 tablet 1   metformin (FORTAMET) 1000 MG (OSM) 24 hr tablet Take 1 tablet (1,000 mg total) by mouth 2 (two) times daily with a meal. 180 tablet 1   metoprolol tartrate (LOPRESSOR) 25 MG tablet Take 1 tablet (25 mg total) by mouth 2 (two) times daily. 60 tablet 5   mometasone (NASONEX) 50 MCG/ACT nasal spray Place 2 sprays into the nose daily. (Patient not taking: Reported on 03/15/2022) 1 each 12   omeprazole (PRILOSEC) 20 MG capsule Take 1 capsule (20 mg total) by mouth daily. 90 capsule 3   ONE TOUCH ULTRA TEST test strip USE AS DIRECTED ONCE DAILY  100 each 0   Semaglutide, 2 MG/DOSE, 8 MG/3ML SOPN Inject 2 mg as directed once a week. 9 mL 1   No current facility-administered medications for this visit.     Family History    Family History  Problem Relation Age of Onset   Hypertension Father    Stroke Paternal Grandfather    Heart attack Paternal Grandfather    He indicated that his mother is alive. He indicated that his father is alive. He indicated that the status of his paternal grandfather is unknown.  Social History    Social History   Socioeconomic History   Marital status: Married    Spouse name: Not on file   Number of children: Not on file   Years of education: Not on file   Highest education level: Not on file  Occupational History   Not on file  Tobacco Use   Smoking status: Former    Packs/day: 1.00    Types: Cigarettes    Quit date: 07/14/2021    Years since quitting: 0.6   Smokeless tobacco: Never  Vaping Use   Vaping Use: Never used  Substance and Sexual Activity   Alcohol use: Not Currently   Drug  use: No   Sexual activity: Yes  Other Topics Concern   Not on file  Social History Narrative   Caffeine use: 2-3 per day (soda)   Social Determinants of Health   Financial Resource Strain: Not on file  Food Insecurity: No Food Insecurity (01/08/2022)   Hunger Vital Sign    Worried About Running Out of Food in the Last Year: Never true    Ran Out of Food in the Last Year: Never true  Transportation Needs: No Transportation Needs (01/08/2022)   PRAPARE - Hydrologist (Medical): No    Lack of Transportation (Non-Medical): No  Physical Activity: Not on file  Stress: Not on file  Social Connections: Not on file  Intimate Partner Violence: Not on file     Review of Systems    General:  No chills, fever, night sweats or weight changes.  Cardiovascular:  No chest pain, dyspnea on exertion, edema, orthopnea, palpitations, paroxysmal nocturnal dyspnea.  Endorses shortness  of breath and occasional wheeze Dermatological: No rash, lesions/masses Respiratory: Endorses cough from recent upper respiratory infection, dyspnea Urologic: No hematuria, dysuria Abdominal:   No nausea, vomiting, diarrhea, bright red blood per rectum, melena, or hematemesis Neurologic:  No visual changes, wkns, changes in mental status. All other systems reviewed and are otherwise negative except as noted above.   Physical Exam    VS:  BP 116/84 (BP Location: Left Arm, Patient Position: Sitting, Cuff Size: Normal)   Pulse 98   Ht '5\' 8"'$  (1.727 m)   Wt 241 lb 12.8 oz (109.7 kg)   SpO2 94%   BMI 36.77 kg/m  , BMI Body mass index is 36.77 kg/m.     GEN: Well nourished, well developed, in no acute distress. HEENT: normal.   Neck: Supple, no JVD, carotid bruits, or masses. Cardiac: RRR, no murmurs, rubs, or gallops. No clubbing, cyanosis, edema.  Radials/DP/PT 2+ and equal bilaterally.  Respiratory:  Respirations regular and unlabored, clear to auscultation bilaterally. GI: Soft, nontender, nondistended, BS + x 4. MS: no deformity or atrophy. Skin: warm and dry, no rash. Neuro:  Strength and sensation are intact. Psych: Normal affect.  Accessory Clinical Findings    ECG personally reviewed by me today-sinus rhythm rate of 98- No acute changes  Lab Results  Component Value Date   WBC 9.7 12/24/2021   HGB 16.2 12/24/2021   HCT 48.2 12/24/2021   MCV 95 12/24/2021   PLT 165 12/24/2021   Lab Results  Component Value Date   CREATININE 0.97 12/24/2021   BUN 8 12/24/2021   NA 138 12/24/2021   K 3.8 12/24/2021   CL 102 12/24/2021   CO2 22 12/24/2021   Lab Results  Component Value Date   ALT 46 (H) 12/24/2021   AST 26 12/24/2021   ALKPHOS 120 12/24/2021   BILITOT 1.1 12/24/2021   Lab Results  Component Value Date   CHOL 144 12/24/2021   HDL 38 (L) 12/24/2021   LDLCALC 72 12/24/2021   TRIG 204 (H) 12/24/2021    Lab Results  Component Value Date   HGBA1C 8.5 (H)  12/24/2021    Assessment & Plan   1.  Coronary artery disease involving native coronary artery without angina.  Left heart catheterization was completed on 07/2021 that showed 70 to 90% stenosis in D1 which was too small for intervention, moderate LAD, OM 2, PDA disease.  Echocardiogram revealed LVEF of 60 to 65% with no regional wall motion abnormalities.  He continues to deny chest discomfort or anginal equivalents today.  He has been continued on aspirin 81 mg daily, atorvastatin 40 mg daily, metoprolol tartrate 25 mg twice daily, and has Nitrostat 0.4 mg sublingual as needed.  EKG today revealed sinus rhythm.  2.  Mixed hyperlipidemia with an LDL of 72 on 12/24/2021.  He is continued on Lipitor 40 mg daily.  This continues to be followed by his PCP.  His triglycerides were elevated at 204 which is improved from recent as last year he was 433.  He has been encouraged to continue with dietary changes of decreasing his breads pastries pastas sweets.  3.  Type 2 diabetes with his last A1c of 8.5 on 12/24/2021.  He has continued on metformin and Ozempic.  This continues to be followed by his PCP.  4.  Disposition patient return to clinic to see MD/APP in 6 months or sooner if needed.  Woodie Degraffenreid, NP 03/15/2022, 9:00 AM

## 2022-03-15 ENCOUNTER — Ambulatory Visit: Payer: BC Managed Care – PPO | Attending: Cardiology | Admitting: Cardiology

## 2022-03-15 ENCOUNTER — Encounter: Payer: Self-pay | Admitting: Cardiology

## 2022-03-15 VITALS — BP 116/84 | HR 98 | Ht 68.0 in | Wt 241.8 lb

## 2022-03-15 DIAGNOSIS — E782 Mixed hyperlipidemia: Secondary | ICD-10-CM

## 2022-03-15 DIAGNOSIS — R809 Proteinuria, unspecified: Secondary | ICD-10-CM

## 2022-03-15 DIAGNOSIS — I2510 Atherosclerotic heart disease of native coronary artery without angina pectoris: Secondary | ICD-10-CM | POA: Diagnosis not present

## 2022-03-15 DIAGNOSIS — E1129 Type 2 diabetes mellitus with other diabetic kidney complication: Secondary | ICD-10-CM

## 2022-03-15 NOTE — Patient Instructions (Signed)
Medication Instructions:  - Your physician recommends that you continue on your current medications as directed. Please refer to the Current Medication list given to you today.  *If you need a refill on your cardiac medications before your next appointment, please call your pharmacy*   Lab Work: - none ordered  If you have labs (blood work) drawn today and your tests are completely normal, you will receive your results only by: [place] (if you have MyChart) OR A paper copy in the mail If you have any lab test that is abnormal or we need to change your treatment, we will call you to review the results.   Testing/Procedures: - none ordered   Follow-Up: At [facility], you and your health needs are our priority.  As part of our continuing mission to provide you with exceptional heart care, we have created designated Provider Care Teams.  These Care Teams include your primary Cardiologist (physician) and Advanced Practice Providers (APPs -  Physician Assistants and Nurse Practitioners) who all work together to provide you with the care you need, when you need it.  We recommend signing up for the patient portal called "MyChart".  Sign up information is provided on this After Visit Summary.  MyChart is used to connect with patients for Virtual Visits (Telemedicine).  Patients are able to view lab/test results, encounter notes, upcoming appointments, etc.  Non-urgent messages can be sent to your provider as well.   To learn more about what you can do with MyChart, go to [email or web address].    Your next appointment:   6 month(s)  The format for your next appointment:   In Person  Provider:   You may see Kate Sable, MD or one of the following Advanced Practice Providers on your designated Care Team:    Gerrie Nordmann, NP    Other Instructions N/a  Important Information About Sugar

## 2022-03-26 ENCOUNTER — Encounter: Payer: Self-pay | Admitting: Family Medicine

## 2022-03-26 ENCOUNTER — Ambulatory Visit: Payer: BC Managed Care – PPO | Admitting: Family Medicine

## 2022-03-26 VITALS — BP 117/79 | HR 97 | Temp 97.9°F | Ht 68.0 in | Wt 238.6 lb

## 2022-03-26 DIAGNOSIS — R809 Proteinuria, unspecified: Secondary | ICD-10-CM | POA: Diagnosis not present

## 2022-03-26 DIAGNOSIS — J189 Pneumonia, unspecified organism: Secondary | ICD-10-CM | POA: Diagnosis not present

## 2022-03-26 DIAGNOSIS — E1129 Type 2 diabetes mellitus with other diabetic kidney complication: Secondary | ICD-10-CM | POA: Diagnosis not present

## 2022-03-26 LAB — BAYER DCA HB A1C WAIVED: HB A1C (BAYER DCA - WAIVED): 7 % — ABNORMAL HIGH (ref 4.8–5.6)

## 2022-03-26 MED ORDER — DOXYCYCLINE HYCLATE 100 MG PO TABS: 100.0000 mg | ORAL_TABLET | Freq: Two times a day (BID) | ORAL | 0 refills | Status: DC

## 2022-03-26 MED ORDER — PREDNISONE 10 MG PO TABS: ORAL_TABLET | ORAL | 0 refills | Status: DC

## 2022-03-26 NOTE — Progress Notes (Signed)
BP 117/79 (BP Location: Left Arm, Cuff Size: Normal)   Pulse 97   Temp 97.9 F (36.6 C) (Oral)   Ht '5\' 8"'$  (1.727 m)   Wt 238 lb 9.6 oz (108.2 kg)   SpO2 94%   BMI 36.28 kg/m    Subjective:    Patient ID: Casey Cole, male    DOB: 04-26-1976, 46 y.o.   MRN: [id number]  HPI: Casey Cole is a 46 y.o. male  Chief Complaint  Patient presents with   Diabetes   Cough    Pt states he has been coughing since he was sick last month. States he is having back pain from coughing so much.    DIABETES Hypoglycemic episodes:no Polydipsia/polyuria: no Visual disturbance: no Chest pain: no Paresthesias: no Glucose Monitoring: yes  Accucheck frequency: occasinally Taking Insulin?: no Blood Pressure Monitoring: not checking Retinal Examination: Not up to Date Foot Exam: Up to Date Diabetic Education: Completed Pneumovax: Up to Date Influenza: Not Up to Date Aspirin: no  COUGH Duration: 2 months Circumstances of initial development of cough: URI Cough severity: moderate Cough description: productive and irritating Aggravating factors:  nothing Alleviating factors: nothing Status:  stable Treatments attempted: none Wheezing: yes Shortness of breath: yes Chest pain: yes Chest tightness:yes Nasal congestion: no Runny nose: yes Postnasal drip: no Frequent throat clearing or swallowing: no Hemoptysis: no Fevers: no Night sweats: yes Weight loss: no Heartburn: no Recent foreign travel: no Tuberculosis contacts: no   Relevant past medical, surgical, family and social history reviewed and updated as indicated. Interim medical history since our last visit reviewed. Allergies and medications reviewed and updated.  Review of Systems  Constitutional:  Positive for chills, diaphoresis and fatigue. Negative for activity change, appetite change, fever and unexpected weight change.  HENT:  Positive for congestion and ear pain. Negative for dental problem, drooling, ear  discharge, facial swelling, hearing loss, mouth sores, nosebleeds, postnasal drip, rhinorrhea, sinus pressure, sinus pain, sneezing, sore throat, tinnitus, trouble swallowing and voice change.   Eyes: Negative.   Respiratory:  Positive for cough, chest tightness, shortness of breath and wheezing. Negative for apnea, choking and stridor.   Cardiovascular: Negative.   Gastrointestinal: Negative.   Musculoskeletal: Negative.   Psychiatric/Behavioral: Negative.      Per HPI unless specifically indicated above     Objective:    BP 117/79 (BP Location: Left Arm, Cuff Size: Normal)   Pulse 97   Temp 97.9 F (36.6 C) (Oral)   Ht '5\' 8"'$  (1.727 m)   Wt 238 lb 9.6 oz (108.2 kg)   SpO2 94%   BMI 36.28 kg/m   Wt Readings from Last 3 Encounters:  03/26/22 238 lb 9.6 oz (108.2 kg)  03/15/22 241 lb 12.8 oz (109.7 kg)  01/23/22 239 lb (108.4 kg)    Physical Exam Vitals and nursing note reviewed.  Constitutional:      General: He is not in acute distress.    Appearance: Normal appearance. He is not ill-appearing, toxic-appearing or diaphoretic.  HENT:     Head: Normocephalic and atraumatic.     Right Ear: Tympanic membrane, ear canal and external ear normal.     Left Ear: Tympanic membrane, ear canal and external ear normal.     Nose: Nose normal. No congestion or rhinorrhea.     Mouth/Throat:     Mouth: Mucous membranes are moist.     Pharynx: Oropharynx is clear. No oropharyngeal exudate or posterior oropharyngeal erythema.  Eyes:  General: No scleral icterus.       Right eye: No discharge.        Left eye: No discharge.     Extraocular Movements: Extraocular movements intact.     Conjunctiva/sclera: Conjunctivae normal.     Pupils: Pupils are equal, round, and reactive to light.  Cardiovascular:     Rate and Rhythm: Normal rate and regular rhythm.     Pulses: Normal pulses.     Heart sounds: Normal heart sounds. No murmur heard.    No friction rub. No gallop.  Pulmonary:      Effort: Pulmonary effort is normal. No respiratory distress.     Breath sounds: No stridor. Rhonchi (R lung and L base) present. No wheezing or rales.  Chest:     Chest wall: No tenderness.  Musculoskeletal:        General: Normal range of motion.     Cervical back: Normal range of motion and neck supple.  Skin:    General: Skin is warm and dry.     Capillary Refill: Capillary refill takes less than 2 seconds.     Coloration: Skin is not jaundiced or pale.     Findings: No bruising, erythema, lesion or rash.  Neurological:     General: No focal deficit present.     Mental Status: He is alert and oriented to person, place, and time. Mental status is at baseline.  Psychiatric:        Mood and Affect: Mood normal.        Behavior: Behavior normal.        Thought Content: Thought content normal.        Judgment: Judgment normal.     Results for orders placed or performed in visit on 01/23/22  Novel Coronavirus, NAA ([facility])   Specimen: Nasopharyngeal(NP) swabs in vial transport medium  Result Value Ref Range   SARS-CoV-2, NAA Not Detected Not Detected  Veritor Flu A/B Waived  Result Value Ref Range   Influenza A Negative Negative   Influenza B Negative Negative      Assessment & Plan:   Problem List Items Addressed This Visit       Endocrine   DM (diabetes mellitus), type 2 with renal complications ([place]) - Primary    Doing much better with A1c of 7 down from 8.5. Continue current regimen. Continue to monitor. Call with any concerns.       Relevant Orders   Bayer DCA Hb A1c Waived   Other Visit Diagnoses     Pneumonia of right upper lobe due to infectious organism       Will treat with doxycycline and prednisone. Follow up 2 weeks. Call with any concerns.   Relevant Medications   doxycycline (VIBRA-TABS) 100 MG tablet        Follow up plan: Return in about 2 weeks (around 04/09/2022).

## 2022-03-26 NOTE — Assessment & Plan Note (Signed)
Doing much better with A1c of 7 down from 8.5. Continue current regimen. Continue to monitor. Call with any concerns.

## 2022-04-04 ENCOUNTER — Other Ambulatory Visit: Payer: Self-pay | Admitting: Family Medicine

## 2022-04-04 NOTE — Telephone Encounter (Signed)
Future visit in 5 days.  Requested Prescriptions  Pending Prescriptions Disp Refills   omeprazole (PRILOSEC) 20 MG capsule [Pharmacy Med Name: OMEPRAZOLE '20MG'$  CAPSULES] 90 capsule 1    Sig: TAKE 1 CAPSULE(20 MG) BY MOUTH DAILY     Gastroenterology: Proton Pump Inhibitors Passed - 04/04/2022  3:09 PM      Passed - Valid encounter within last 12 months    Recent Outpatient Visits           1 week ago Type 2 diabetes mellitus with microalbuminuria, without long-term current use of insulin ([place])   [place], Megan P, DO   2 months ago Viral upper respiratory tract infection   [facility] Mecum, Dani Gobble, PA-C   3 months ago Routine general medical examination at a health care facility   [facility], [place] P, DO   3 months ago Non-recurrent acute serous otitis media of left ear   [facility], Jolene T, NP   6 months ago Type 2 diabetes mellitus with microalbuminuria, without long-term current use of insulin ([place])   [place], [place]all, DO       Future Appointments             In 5 days Wynetta Emery, Barb Merino, DO [employer], [place]   In 5 months [place], Aaron Edelman, MD [place]. [place]

## 2022-04-09 ENCOUNTER — Ambulatory Visit (INDEPENDENT_AMBULATORY_CARE_PROVIDER_SITE_OTHER): Payer: BC Managed Care – PPO | Admitting: Family Medicine

## 2022-04-09 ENCOUNTER — Encounter: Payer: Self-pay | Admitting: Family Medicine

## 2022-04-09 VITALS — BP 112/73 | HR 97 | Temp 98.3°F | Ht 68.0 in | Wt 240.0 lb

## 2022-04-09 DIAGNOSIS — J189 Pneumonia, unspecified organism: Secondary | ICD-10-CM

## 2022-04-09 NOTE — Progress Notes (Signed)
BP 112/73   Pulse 97   Temp 98.3 F (36.8 C) (Oral)   Ht '5\' 8"'$  (1.727 m)   Wt 240 lb (108.9 kg)   SpO2 94%   BMI 36.49 kg/m    Subjective:    Patient ID: Casey Cole, male    DOB: 02/17/1976, 46 y.o.   MRN: [id number]  HPI: Casey Cole is a 46 y.o. male  Chief Complaint  Patient presents with  . Pneumonia    Patient say he doesn't feel the cough is as bad, but he is still coughing.     Relevant past medical, surgical, family and social history reviewed and updated as indicated. Interim medical history since our last visit reviewed. Allergies and medications reviewed and updated.  Review of Systems  Constitutional: Negative.   Respiratory:  Positive for cough and shortness of breath. Negative for apnea, choking, chest tightness, wheezing and stridor.   Cardiovascular: Negative.   Gastrointestinal: Negative.   Musculoskeletal: Negative.   Psychiatric/Behavioral: Negative.      Per HPI unless specifically indicated above     Objective:    BP 112/73   Pulse 97   Temp 98.3 F (36.8 C) (Oral)   Ht '5\' 8"'$  (1.727 m)   Wt 240 lb (108.9 kg)   SpO2 94%   BMI 36.49 kg/m   Wt Readings from Last 3 Encounters:  04/09/22 240 lb (108.9 kg)  03/26/22 238 lb 9.6 oz (108.2 kg)  03/15/22 241 lb 12.8 oz (109.7 kg)    Physical Exam Vitals and nursing note reviewed.  Constitutional:      General: He is not in acute distress.    Appearance: Normal appearance. He is obese. He is not ill-appearing, toxic-appearing or diaphoretic.  HENT:     Head: Normocephalic and atraumatic.     Right Ear: External ear normal.     Left Ear: External ear normal.     Nose: Nose normal.     Mouth/Throat:     Mouth: Mucous membranes are moist.     Pharynx: Oropharynx is clear.  Eyes:     General: No scleral icterus.       Right eye: No discharge.        Left eye: No discharge.     Extraocular Movements: Extraocular movements intact.     Conjunctiva/sclera: Conjunctivae normal.      Pupils: Pupils are equal, round, and reactive to light.  Cardiovascular:     Rate and Rhythm: Normal rate and regular rhythm.     Pulses: Normal pulses.     Heart sounds: Normal heart sounds. No murmur heard.    No friction rub. No gallop.  Pulmonary:     Effort: Pulmonary effort is normal. No respiratory distress.     Breath sounds: No stridor. Rhonchi (RLL) present. No wheezing or rales.  Chest:     Chest wall: No tenderness.  Musculoskeletal:        General: Normal range of motion.     Cervical back: Normal range of motion and neck supple.  Skin:    General: Skin is warm and dry.     Capillary Refill: Capillary refill takes less than 2 seconds.     Coloration: Skin is not jaundiced or pale.     Findings: No bruising, erythema, lesion or rash.  Neurological:     General: No focal deficit present.     Mental Status: He is alert and oriented to person, place, and time. Mental status is at  baseline.  Psychiatric:        Mood and Affect: Mood normal.        Behavior: Behavior normal.        Thought Content: Thought content normal.        Judgment: Judgment normal.    Results for orders placed or performed in visit on 03/26/22  Bayer DCA Hb A1c Waived  Result Value Ref Range   HB A1C (BAYER DCA - WAIVED) 7.0 (H) 4.8 - 5.6 %      Assessment & Plan:   Problem List Items Addressed This Visit   None    Follow up plan: No follow-ups on file.

## 2022-04-10 ENCOUNTER — Ambulatory Visit
Admission: RE | Admit: 2022-04-10 | Discharge: 2022-04-10 | Disposition: A | Discharge status: Home / Self Care | Payer: BC Managed Care – PPO | Source: Ambulatory Visit | Attending: Family Medicine | Admitting: Family Medicine

## 2022-04-10 ENCOUNTER — Encounter: Payer: Self-pay | Admitting: Family Medicine

## 2022-04-10 ENCOUNTER — Ambulatory Visit
Admission: RE | Admit: 2022-04-10 | Discharge: 2022-04-10 | Disposition: A | Discharge status: Home / Self Care | Payer: BC Managed Care – PPO | Source: Home / Self Care | Attending: Family Medicine | Admitting: Family Medicine

## 2022-04-10 DIAGNOSIS — J189 Pneumonia, unspecified organism: Secondary | ICD-10-CM | POA: Diagnosis not present

## 2022-06-10 ENCOUNTER — Other Ambulatory Visit: Payer: Self-pay | Admitting: Family Medicine

## 2022-06-11 ENCOUNTER — Other Ambulatory Visit: Payer: Self-pay

## 2022-06-11 MED ORDER — ATORVASTATIN CALCIUM 40 MG PO TABS: 40.0000 mg | ORAL_TABLET | Freq: Every day | ORAL | 1 refills | Status: DC

## 2022-06-11 NOTE — Telephone Encounter (Signed)
Requested Prescriptions  Pending Prescriptions Disp Refills   metformin (FORTAMET) 1000 MG (OSM) 24 hr tablet [Pharmacy Med Name: METFORMIN ER '1000MG'$  OSMOTIC TABS] 180 tablet 1    Sig: TAKE 1 TABLET(1000 MG) BY MOUTH TWICE DAILY WITH A MEAL     Endocrinology:  Diabetes - Biguanides Failed - 06/10/2022  4:45 PM      Failed - B12 Level in normal range and within 720 days    No results found for: "VITAMINB12"       Passed - Cr in normal range and within 360 days    Creatinine, Ser  Date Value Ref Range Status  12/24/2021 0.97 0.76 - 1.27 mg/dL Final         Passed - HBA1C is between 0 and 7.9 and within 180 days    Hemoglobin A1C  Date Value Ref Range Status  01/19/2016 7.8  Final   HB A1C (BAYER DCA - WAIVED)  Date Value Ref Range Status  03/26/2022 7.0 (H) 4.8 - 5.6 % Final    Comment:             Prediabetes: 5.7 - 6.4          Diabetes: >6.4          Glycemic control for adults with diabetes: <7.0          Passed - eGFR in normal range and within 360 days    GFR calc Af Amer  Date Value Ref Range Status  03/24/2020 105 >59 mL/min/1.73 Final    Comment:    **In accordance with recommendations from the NKF-ASN Task force,**   [facility] is in the process of updating its eGFR calculation to the   2021 CKD-EPI creatinine equation that estimates kidney function   without a race variable.    GFR, Estimated  Date Value Ref Range Status  06/21/2021 >60 >60 mL/min Final    Comment:    (NOTE) Calculated using the CKD-EPI Creatinine Equation (2021)    eGFR  Date Value Ref Range Status  12/24/2021 98 >59 mL/min/1.73 Final         Passed - Valid encounter within last 6 months    Recent Outpatient Visits           2 months ago Pneumonia of right upper lobe due to infectious organism   [place] P, DO   2 months ago Type 2 diabetes mellitus with microalbuminuria, without long-term current use of insulin ([place])   [place], Megan P, DO   4 months ago Viral upper respiratory tract infection   [facility] Mecum, Dani Gobble, PA-C   5 months ago Routine general medical examination at a health care facility   [facility] [place] P, DO   6 months ago Non-recurrent acute serous otitis media of left ear   [place] Venita Lick, NP       Future Appointments             In 4 weeks Valerie Roys, DO [place], [place]   In 3 months Agbor-Etang, Aaron Edelman, MD [facility] within normal limits and completed in the last 12 months    WBC  Date Value Ref Range Status  12/24/2021 9.7 3.4 - 10.8 x10E3/uL Final  06/21/2021  13.4 (H) 4.0 - 10.5 K/uL Final   RBC  Date Value Ref Range Status  12/24/2021 5.10 4.14 - 5.80 x10E6/uL Final  06/21/2021 5.23 4.22 - 5.81 MIL/uL Final   Hemoglobin  Date Value Ref Range Status  12/24/2021 16.2 13.0 - 17.7 g/dL Final   Hematocrit  Date Value Ref Range Status  12/24/2021 48.2 37.5 - 51.0 % Final   MCHC  Date Value Ref Range Status  12/24/2021 33.6 31.5 - 35.7 g/dL Final  06/21/2021 34.2 30.0 - 36.0 g/dL Final   [place]  Date Value Ref Range Status  12/24/2021 31.8 26.6 - 33.0 pg Final  06/21/2021 30.8 26.0 - 34.0 pg Final   MCV  Date Value Ref Range Status  12/24/2021 95 79 - 97 fL Final   No results found for: "PLTCOUNTKUC", "LABPLAT", "POCPLA" RDW  Date Value Ref Range Status  12/24/2021 13.2 11.6 - 15.4 % Final

## 2022-06-12 ENCOUNTER — Encounter: Payer: Self-pay | Admitting: Family Medicine

## 2022-06-17 MED ORDER — METFORMIN HCL 500 MG PO TABS: 1000.0000 mg | ORAL_TABLET | Freq: Two times a day (BID) | ORAL | 1 refills | Status: DC

## 2022-07-09 ENCOUNTER — Ambulatory Visit: Payer: PRIVATE HEALTH INSURANCE | Admitting: Family Medicine

## 2022-07-09 ENCOUNTER — Encounter: Payer: Self-pay | Admitting: Family Medicine

## 2022-07-09 VITALS — BP 138/67 | HR 98 | Temp 98.9°F | Ht 68.0 in | Wt 245.6 lb

## 2022-07-09 DIAGNOSIS — I129 Hypertensive chronic kidney disease with stage 1 through stage 4 chronic kidney disease, or unspecified chronic kidney disease: Secondary | ICD-10-CM | POA: Diagnosis not present

## 2022-07-09 DIAGNOSIS — R809 Proteinuria, unspecified: Secondary | ICD-10-CM | POA: Diagnosis not present

## 2022-07-09 DIAGNOSIS — E1129 Type 2 diabetes mellitus with other diabetic kidney complication: Secondary | ICD-10-CM | POA: Diagnosis not present

## 2022-07-09 DIAGNOSIS — E782 Mixed hyperlipidemia: Secondary | ICD-10-CM

## 2022-07-09 MED ORDER — SEMAGLUTIDE (2 MG/DOSE) 8 MG/3ML ~~LOC~~ SOPN: 2.0000 mg | PEN_INJECTOR | SUBCUTANEOUS | 1 refills | Status: DC

## 2022-07-09 MED ORDER — METOPROLOL SUCCINATE ER 25 MG PO TB24: 25.0000 mg | ORAL_TABLET | Freq: Every day | ORAL | 1 refills | Status: DC

## 2022-07-09 NOTE — Assessment & Plan Note (Signed)
Up slightly with A1c of 7.9. Will work on taking his medicine daily. Call with any concerns. Recheck 3 months. Call with any concerns.

## 2022-07-09 NOTE — Progress Notes (Signed)
BP 138/67   Pulse 98   Temp 98.9 F (37.2 C) (Oral)   Ht '5\' 8"'$  (1.727 m)   Wt 245 lb 9.6 oz (111.4 kg)   SpO2 95%   BMI 37.34 kg/m    Subjective:    Patient ID: Casey Cole, male    DOB: [birth year], 47 y.o.   MRN: [id number]  HPI: Casey Cole is a 47 y.o. male  Chief Complaint  Patient presents with   Diabetes    Patient declines having a recent Diabetic Eye Exam.    Hyperlipidemia   Hypertension   DIABETES Hypoglycemic episodes:no Polydipsia/polyuria: no Visual disturbance: no Chest pain: no Paresthesias: no Glucose Monitoring: no  Accucheck frequency: Not Checking Taking Insulin?: no Blood Pressure Monitoring: not checking Retinal Examination: Not up to Date Foot Exam: Up to Date Diabetic Education: Completed Pneumovax: Up to Date Influenza: Not up to Date Aspirin: yes  HYPERTENSION / HYPERLIPIDEMIA Satisfied with current treatment? yes Duration of hypertension: chronic BP monitoring frequency: not checking BP medication side effects: no Past BP meds: metoprolol Duration of hyperlipidemia: chronic Cholesterol medication side effects: no Cholesterol supplements: none Past cholesterol medications: atorvastatin Medication compliance: excellent compliance Aspirin: yes Recent stressors: no Recurrent headaches: no Visual changes: no Palpitations: no Dyspnea: no Chest pain: no Lower extremity edema: no Dizzy/lightheaded: no  Relevant past medical, surgical, family and social history reviewed and updated as indicated. Interim medical history since our last visit reviewed. Allergies and medications reviewed and updated.  Review of Systems  Constitutional: Negative.   Respiratory: Negative.    Cardiovascular: Negative.   Gastrointestinal: Negative.   Musculoskeletal: Negative.   Neurological: Negative.   Psychiatric/Behavioral: Negative.      Per HPI unless specifically indicated above     Objective:    BP 138/67   Pulse 98   Temp 98.9 F  (37.2 C) (Oral)   Ht '5\' 8"'$  (1.727 m)   Wt 245 lb 9.6 oz (111.4 kg)   SpO2 95%   BMI 37.34 kg/m   Wt Readings from Last 3 Encounters:  07/09/22 245 lb 9.6 oz (111.4 kg)  04/09/22 240 lb (108.9 kg)  03/26/22 238 lb 9.6 oz (108.2 kg)    Physical Exam Vitals and nursing note reviewed.  Constitutional:      General: He is not in acute distress.    Appearance: Normal appearance. He is obese. He is not ill-appearing, toxic-appearing or diaphoretic.  HENT:     Head: Normocephalic and atraumatic.     Right Ear: External ear normal.     Left Ear: External ear normal.     Nose: Nose normal.     Mouth/Throat:     Mouth: Mucous membranes are moist.     Pharynx: Oropharynx is clear.  Eyes:     General: No scleral icterus.       Right eye: No discharge.        Left eye: No discharge.     Extraocular Movements: Extraocular movements intact.     Conjunctiva/sclera: Conjunctivae normal.     Pupils: Pupils are equal, round, and reactive to light.  Cardiovascular:     Rate and Rhythm: Normal rate and regular rhythm.     Pulses: Normal pulses.     Heart sounds: Normal heart sounds. No murmur heard.    No friction rub. No gallop.  Pulmonary:     Effort: Pulmonary effort is normal. No respiratory distress.     Breath sounds: Normal breath sounds. No stridor. No  wheezing, rhonchi or rales.  Chest:     Chest wall: No tenderness.  Musculoskeletal:        General: Normal range of motion.     Cervical back: Normal range of motion and neck supple.  Skin:    General: Skin is warm and dry.     Capillary Refill: Capillary refill takes less than 2 seconds.     Coloration: Skin is not jaundiced or pale.     Findings: No bruising, erythema, lesion or rash.  Neurological:     General: No focal deficit present.     Mental Status: He is alert and oriented to person, place, and time. Mental status is at baseline.  Psychiatric:        Mood and Affect: Mood normal.        Behavior: Behavior normal.         Thought Content: Thought content normal.        Judgment: Judgment normal.     Results for orders placed or performed in visit on 03/26/22  Bayer DCA Hb A1c Waived  Result Value Ref Range   HB A1C (BAYER DCA - WAIVED) 7.0 (H) 4.8 - 5.6 %      Assessment & Plan:   Problem List Items Addressed This Visit       Endocrine   DM (diabetes mellitus), type 2 with renal complications ([place]) - Primary    Up slightly with A1c of 7.9. Will work on taking his medicine daily. Call with any concerns. Recheck 3 months. Call with any concerns.       Relevant Medications   Semaglutide, 2 MG/DOSE, 8 MG/3ML SOPN   Other Relevant Orders   CBC with Differential/Platelet   Bayer DCA Hb A1c Waived   Comprehensive metabolic panel     Genitourinary   Benign hypertensive renal disease    Under good control on current regimen. Continue current regimen. Continue to monitor. Call with any concerns. Refills given. Labs drawn today.       Relevant Orders   CBC with Differential/Platelet   Comprehensive metabolic panel     Other   Hyperlipidemia    Under good control on current regimen. Continue current regimen. Continue to monitor. Call with any concerns. Refills given. Labs drawn today.        Relevant Medications   metoprolol succinate (TOPROL-XL) 25 MG 24 hr tablet   Other Relevant Orders   CBC with Differential/Platelet   Lipid Panel w/o Chol/HDL Ratio   Comprehensive metabolic panel     Follow up plan: Return in about 3 months (around 10/09/2022).

## 2022-07-09 NOTE — Assessment & Plan Note (Signed)
Under good control on current regimen. Continue current regimen. Continue to monitor. Call with any concerns. Refills given. Labs drawn today.   

## 2022-07-10 LAB — CBC WITH DIFFERENTIAL/PLATELET
Basophils Absolute: 0.1 10*3/uL (ref 0.0–0.2)
Basos: 1 %
EOS (ABSOLUTE): 0.2 10*3/uL (ref 0.0–0.4)
Eos: 2 %
Hematocrit: 45.6 % (ref 37.5–51.0)
Hemoglobin: 15.8 g/dL (ref 13.0–17.7)
Immature Grans (Abs): 0 10*3/uL (ref 0.0–0.1)
Immature Granulocytes: 0 %
Lymphocytes Absolute: 2.8 10*3/uL (ref 0.7–3.1)
Lymphs: 23 %
MCH: 31.9 pg (ref 26.6–33.0)
MCHC: 34.6 g/dL (ref 31.5–35.7)
MCV: 92 fL (ref 79–97)
Monocytes Absolute: 0.8 10*3/uL (ref 0.1–0.9)
Monocytes: 6 %
Neutrophils Absolute: 8.1 10*3/uL — ABNORMAL HIGH (ref 1.4–7.0)
Neutrophils: 68 %
Platelets: 180 10*3/uL (ref 150–450)
RBC: 4.96 x10E6/uL (ref 4.14–5.80)
RDW: 12.8 % (ref 11.6–15.4)
WBC: 12 10*3/uL — ABNORMAL HIGH (ref 3.4–10.8)

## 2022-07-10 LAB — COMPREHENSIVE METABOLIC PANEL
ALT: 33 IU/L (ref 0–44)
AST: 20 IU/L (ref 0–40)
Albumin/Globulin Ratio: 2.5 — ABNORMAL HIGH (ref 1.2–2.2)
Albumin: 4.8 g/dL (ref 4.1–5.1)
Alkaline Phosphatase: 94 IU/L (ref 44–121)
BUN/Creatinine Ratio: 7 — ABNORMAL LOW (ref 9–20)
BUN: 7 mg/dL (ref 6–24)
Bilirubin Total: 1.4 mg/dL — ABNORMAL HIGH (ref 0.0–1.2)
CO2: 25 mmol/L (ref 20–29)
Calcium: 9.2 mg/dL (ref 8.7–10.2)
Chloride: 101 mmol/L (ref 96–106)
Creatinine, Ser: 1.06 mg/dL (ref 0.76–1.27)
Globulin, Total: 1.9 g/dL (ref 1.5–4.5)
Glucose: 147 mg/dL — ABNORMAL HIGH (ref 70–99)
Potassium: 3.6 mmol/L (ref 3.5–5.2)
Sodium: 141 mmol/L (ref 134–144)
Total Protein: 6.7 g/dL (ref 6.0–8.5)
eGFR: 88 mL/min/{1.73_m2} (ref >59)

## 2022-07-10 LAB — LIPID PANEL W/O CHOL/HDL RATIO
Cholesterol, Total: 173 mg/dL (ref 100–199)
HDL: 33 mg/dL — ABNORMAL LOW (ref >39)
LDL Chol Calc (NIH): 95 mg/dL (ref 0–99)
Triglycerides: 268 mg/dL — ABNORMAL HIGH (ref 0–149)
VLDL Cholesterol Cal: 45 mg/dL — ABNORMAL HIGH (ref 5–40)

## 2022-07-10 LAB — BAYER DCA HB A1C WAIVED: HB A1C (BAYER DCA - WAIVED): 7.9 % — ABNORMAL HIGH (ref 4.8–5.6)

## 2022-09-05 ENCOUNTER — Ambulatory Visit: Payer: PRIVATE HEALTH INSURANCE | Admitting: Family Medicine

## 2022-09-05 ENCOUNTER — Encounter: Payer: Self-pay | Admitting: Family Medicine

## 2022-09-05 VITALS — BP 135/91 | HR 93 | Temp 98.2°F | Ht 68.0 in | Wt 243.8 lb

## 2022-09-05 DIAGNOSIS — J0100 Acute maxillary sinusitis, unspecified: Secondary | ICD-10-CM | POA: Diagnosis not present

## 2022-09-05 MED ORDER — AMOXICILLIN-POT CLAVULANATE 875-125 MG PO TABS: 1.0000 | ORAL_TABLET | Freq: Two times a day (BID) | ORAL | 0 refills | Status: DC

## 2022-09-05 MED ORDER — PREDNISONE 50 MG PO TABS: 50.0000 mg | ORAL_TABLET | Freq: Every day | ORAL | 0 refills | Status: DC

## 2022-09-05 MED ORDER — TRIAMCINOLONE ACETONIDE 40 MG/ML IJ SUSP
40.0000 mg | Freq: Once | INTRAMUSCULAR | Status: AC
  Administered 2022-09-05: 40 mg via INTRAMUSCULAR

## 2022-09-05 NOTE — Progress Notes (Signed)
BP (!) 135/91   Pulse 93   Temp 98.2 F (36.8 C) (Oral)   Ht 5\' 8"  (1.727 m)   Wt 243 lb 12.8 oz (110.6 kg)   SpO2 96%   BMI 37.07 kg/m    Subjective:    Patient ID: Casey Cole, male    DOB: [birth year], 47 y.o.   MRN: [id number]  HPI: Casey Cole is a 47 y.o. male  Chief Complaint  Patient presents with   Cough    Patient says he has had a cough for several weeks and says it will not go away and says it is now radiating into his ears. Patient say he woke up with a headache this morning. Patient says he has tried over the counter Mucinex, DayQuil and Benadryl. Patient says nothing seems to help him.    Ear Problem   UPPER RESPIRATORY TRACT INFECTION' Duration; 7-10 days Worst symptom: congestion and cough Fever: no Cough: yes Shortness of breath: no Wheezing: yes Chest pain: yes, with cough Chest tightness: yes Chest congestion: yes Nasal congestion: yes Runny nose: yes Post nasal drip: yes Sneezing: yes Sore throat: yes Swollen glands: no Sinus pressure: yes Headache: yes Face pain: yes Toothache: yes Ear pain: yes bilateral Ear pressure: no  Eyes red/itching:yes Eye drainage/crusting: yes  Vomiting: no Rash: no Fatigue: yes Sick contacts: no Strep contacts: no  Context: worse Recurrent sinusitis: no Relief with OTC cold/cough medications: no  Treatments attempted: none   Relevant past medical, surgical, family and social history reviewed and updated as indicated. Interim medical history since our last visit reviewed. Allergies and medications reviewed and updated.  Review of Systems  Constitutional: Negative.   HENT:  Positive for congestion, rhinorrhea, sinus pressure and sinus pain. Negative for dental problem, drooling, ear discharge, ear pain, facial swelling, hearing loss, mouth sores, nosebleeds, postnasal drip, sneezing, sore throat, tinnitus, trouble swallowing and voice change.   Respiratory:  Positive for cough, shortness of breath and  wheezing. Negative for apnea, choking, chest tightness and stridor.   Cardiovascular: Negative.   Gastrointestinal: Negative.   Musculoskeletal: Negative.   Neurological: Negative.   Psychiatric/Behavioral: Negative.      Per HPI unless specifically indicated above     Objective:    BP (!) 135/91   Pulse 93   Temp 98.2 F (36.8 C) (Oral)   Ht 5\' 8"  (1.727 m)   Wt 243 lb 12.8 oz (110.6 kg)   SpO2 96%   BMI 37.07 kg/m   Wt Readings from Last 3 Encounters:  09/05/22 243 lb 12.8 oz (110.6 kg)  07/09/22 245 lb 9.6 oz (111.4 kg)  04/09/22 240 lb (108.9 kg)    Physical Exam Vitals and nursing note reviewed.  Constitutional:      General: He is not in acute distress.    Appearance: Normal appearance. He is obese. He is not ill-appearing, toxic-appearing or diaphoretic.  HENT:     Head: Normocephalic and atraumatic.     Right Ear: Tympanic membrane, ear canal and external ear normal.     Left Ear: Tympanic membrane, ear canal and external ear normal.     Nose: Congestion and rhinorrhea present.     Mouth/Throat:     Mouth: Mucous membranes are moist.     Pharynx: Oropharynx is clear. Posterior oropharyngeal erythema present. No oropharyngeal exudate.  Eyes:     General: No scleral icterus.       Right eye: No discharge.  Left eye: No discharge.     Extraocular Movements: Extraocular movements intact.     Conjunctiva/sclera: Conjunctivae normal.     Pupils: Pupils are equal, round, and reactive to light.  Cardiovascular:     Rate and Rhythm: Normal rate and regular rhythm.     Pulses: Normal pulses.     Heart sounds: Normal heart sounds. No murmur heard.    No friction rub. No gallop.  Pulmonary:     Effort: Pulmonary effort is normal. No respiratory distress.     Breath sounds: No stridor. Wheezing present. No rhonchi or rales.  Chest:     Chest wall: No tenderness.  Musculoskeletal:        General: Normal range of motion.     Cervical back: Normal range of  motion and neck supple.  Lymphadenopathy:     Cervical: Cervical adenopathy present.  Skin:    General: Skin is warm and dry.     Capillary Refill: Capillary refill takes less than 2 seconds.     Coloration: Skin is not jaundiced or pale.     Findings: No bruising, erythema, lesion or rash.  Neurological:     General: No focal deficit present.     Mental Status: He is alert and oriented to person, place, and time. Mental status is at baseline.  Psychiatric:        Mood and Affect: Mood normal.        Behavior: Behavior normal.        Thought Content: Thought content normal.        Judgment: Judgment normal.     Results for orders placed or performed in visit on 07/09/22  CBC with Differential/Platelet  Result Value Ref Range   WBC 12.0 (H) 3.4 - 10.8 x10E3/uL   RBC 4.96 4.14 - 5.80 x10E6/uL   Hemoglobin 15.8 13.0 - 17.7 g/dL   Hematocrit 16.1 09.6 - 51.0 %   MCV 92 79 - 97 fL   MCH 31.9 26.6 - 33.0 pg   MCHC 34.6 31.5 - 35.7 g/dL   RDW 04.5 40.9 - 81.1 %   Platelets 180 150 - 450 x10E3/uL   Neutrophils 68 Not Estab. %   Lymphs 23 Not Estab. %   Monocytes 6 Not Estab. %   Eos 2 Not Estab. %   Basos 1 Not Estab. %   Neutrophils Absolute 8.1 (H) 1.4 - 7.0 x10E3/uL   Lymphocytes Absolute 2.8 0.7 - 3.1 x10E3/uL   Monocytes Absolute 0.8 0.1 - 0.9 x10E3/uL   EOS (ABSOLUTE) 0.2 0.0 - 0.4 x10E3/uL   Basophils Absolute 0.1 0.0 - 0.2 x10E3/uL   Immature Granulocytes 0 Not Estab. %   Immature Grans (Abs) 0.0 0.0 - 0.1 x10E3/uL  Lipid Panel w/o Chol/HDL Ratio  Result Value Ref Range   Cholesterol, Total 173 100 - 199 mg/dL   Triglycerides 914 (H) 0 - 149 mg/dL   HDL 33 (L) >78 mg/dL   VLDL Cholesterol Cal 45 (H) 5 - 40 mg/dL   LDL Chol Calc (NIH) 95 0 - 99 mg/dL  Bayer DCA Hb G9F Waived  Result Value Ref Range   HB A1C (BAYER DCA - WAIVED) 7.9 (H) 4.8 - 5.6 %  Comprehensive metabolic panel  Result Value Ref Range   Glucose 147 (H) 70 - 99 mg/dL   BUN 7 6 - 24 mg/dL    Creatinine, Ser 6.21 0.76 - 1.27 mg/dL   eGFR 88 >30 QM/VHQ/4.69   BUN/Creatinine Ratio 7 (L)  9 - 20   Sodium 141 134 - 144 mmol/L   Potassium 3.6 3.5 - 5.2 mmol/L   Chloride 101 96 - 106 mmol/L   CO2 25 20 - 29 mmol/L   Calcium 9.2 8.7 - 10.2 mg/dL   Total Protein 6.7 6.0 - 8.5 g/dL   Albumin 4.8 4.1 - 5.1 g/dL   Globulin, Total 1.9 1.5 - 4.5 g/dL   Albumin/Globulin Ratio 2.5 (H) 1.2 - 2.2   Bilirubin Total 1.4 (H) 0.0 - 1.2 mg/dL   Alkaline Phosphatase 94 44 - 121 IU/L   AST 20 0 - 40 IU/L   ALT 33 0 - 44 IU/L      Assessment & Plan:   Problem List Items Addressed This Visit   None Visit Diagnoses     Acute non-recurrent maxillary sinusitis    -  Primary   Will treat with prednisone and augmentin. Call with any concerns. Recheck 2 weeks. Call with any concerns.   Relevant Medications   triamcinolone acetonide (KENALOG-40) injection 40 mg   predniSONE (DELTASONE) 50 MG tablet   amoxicillin-clavulanate (AUGMENTIN) 875-125 MG tablet        Follow up plan: Return in about 2 weeks (around 09/19/2022).

## 2022-09-13 ENCOUNTER — Encounter: Payer: Self-pay | Admitting: Cardiology

## 2022-09-13 ENCOUNTER — Ambulatory Visit: Payer: PRIVATE HEALTH INSURANCE | Attending: Cardiology | Admitting: Cardiology

## 2022-09-13 ENCOUNTER — Other Ambulatory Visit
Admission: RE | Admit: 2022-09-13 | Discharge: 2022-09-13 | Disposition: A | Discharge status: Home / Self Care | Payer: PRIVATE HEALTH INSURANCE | Source: Ambulatory Visit | Attending: Cardiology | Admitting: Cardiology

## 2022-09-13 VITALS — BP 122/84 | HR 103 | Ht 68.0 in | Wt 240.2 lb

## 2022-09-13 DIAGNOSIS — I2510 Atherosclerotic heart disease of native coronary artery without angina pectoris: Secondary | ICD-10-CM | POA: Diagnosis not present

## 2022-09-13 DIAGNOSIS — E782 Mixed hyperlipidemia: Secondary | ICD-10-CM

## 2022-09-13 LAB — LIPID PANEL
Cholesterol: 139 mg/dL (ref 0–200)
HDL: 34 mg/dL — ABNORMAL LOW (ref >40)
LDL Cholesterol: 64 mg/dL (ref 0–99)
Total CHOL/HDL Ratio: 4.1 RATIO
Triglycerides: 207 mg/dL — ABNORMAL HIGH (ref <150)
VLDL: 41 mg/dL — ABNORMAL HIGH (ref 0–40)

## 2022-09-13 MED ORDER — METOPROLOL SUCCINATE ER 25 MG PO TB24: 25.0000 mg | ORAL_TABLET | Freq: Every day | ORAL | 1 refills | Status: DC

## 2022-09-13 MED ORDER — ATORVASTATIN CALCIUM 40 MG PO TABS: 40.0000 mg | ORAL_TABLET | Freq: Every day | ORAL | 1 refills | Status: DC

## 2022-09-13 NOTE — Progress Notes (Signed)
Cardiology Office Note:    Date:  09/13/2022   ID:  Casey Cole, DOB 1975/09/20, MRN [id number]  PCP:  Dorcas Carrow, DO   [facility] Providers Cardiologist:  Debbe Odea, MD     Referring MD: Dorcas Carrow, DO   Chief Complaint  Patient presents with   Follow-up    Patient denies new or acute cardiac problems/concerns today.    History of Present Illness:    Casey Cole is a 47 y.o. male with a hx of CAD (LHC 07/2021 70 to 90% D1-too small for intervention, moderate LAD, OM 2, PDA disease), hyperlipidemia, diabetes, former smoker x20+ years, OSA who presents for follow-up.   Denies symptoms of chest pain, compliant with medications including aspirin, Lipitor, metoprolol as prescribed.  Feels well, no concerns at this time.  Prior notes LHC 07/2021 70 to 90% D1-2 small for intervention, moderate LAD, OM 2, PDA disease Echo 07/23/2021 EF 60 to 65%, impaired relaxation Coronary CTA 07/12/2021 calcium score 116, severe stenosis in PDA, severe stenosis OM 2, moderate LAD disease.   Past Medical History:  Diagnosis Date   Abscess of superficial perineal space 08/23/2015   Depression 10/27/2015   Diabetes mellitus without complication (HCC)    Hyperlipidemia    Hypertension    Recurrent major depressive disorder, in full remission (HCC) 10/26/2018   Sleep apnea     Past Surgical History:  Procedure Laterality Date   CHOLECYSTECTOMY     COLONOSCOPY WITH PROPOFOL N/A 01/17/2022   Procedure: COLONOSCOPY WITH PROPOFOL;  Surgeon: Wyline Mood, MD;  Location: [facility] ENDOSCOPY;  Service: Gastroenterology;  Laterality: N/A;   CORONARY PRESSURE/FFR WITH 3D MAPPING N/A 07/17/2021   Procedure: Coronary Pressure Wire/FFR w/3D Mapping;  Surgeon: Yvonne Kendall, MD;  Location: [facility] INVASIVE CV LAB;  Service: Cardiovascular;  Laterality: N/A;   INCISE AND DRAIN ABCESS Left 07-12-15   left buttock   INCISION AND DRAINAGE PERIRECTAL ABSCESS N/A 08/23/2015   Procedure: IRRIGATION AND  DEBRIDEMENT PERIRECTAL ABSCESS;  Surgeon: Earline Mayotte, MD;  Location: [facility] ORS;  Service: General;  Laterality: N/A;  Anal Scope   LEFT HEART CATH AND CORONARY ANGIOGRAPHY Left 07/17/2021   Procedure: LEFT HEART CATH AND CORONARY ANGIOGRAPHY;  Surgeon: Yvonne Kendall, MD;  Location: [facility] INVASIVE CV LAB;  Service: Cardiovascular;  Laterality: Left;   VASECTOMY      Current Medications: Current Meds  Medication Sig   amoxicillin-clavulanate (AUGMENTIN) 875-125 MG tablet Take 1 tablet by mouth 2 (two) times daily.   aspirin EC 81 MG tablet Take 1 tablet (81 mg total) by mouth daily. Swallow whole.   cyclobenzaprine (FLEXERIL) 10 MG tablet Take 1 tablet (10 mg total) by mouth at bedtime. (Patient taking differently: Take 10 mg by mouth as needed for muscle spasms.)   diclofenac Sodium (VOLTAREN) 1 % GEL Apply 4 g topically 4 (four) times daily.   Guselkumab (TREMFYA [place]) Inject 1 Dose into the skin as directed. Every 8 weeks after starter doses.   metFORMIN (GLUCOPHAGE) 500 MG tablet Take 2 tablets (1,000 mg total) by mouth 2 (two) times daily with a meal.   mometasone (NASONEX) 50 MCG/ACT nasal spray Place 2 sprays into the nose daily.   nitroGLYCERIN (NITROSTAT) 0.4 MG SL tablet Place 1 tablet (0.4 mg total) under the tongue every 5 (five) minutes as needed for chest pain. For a maximum dose of 3 tablets.   omeprazole (PRILOSEC) 20 MG capsule TAKE 1 CAPSULE(20 MG) BY MOUTH DAILY   ONE TOUCH ULTRA  TEST test strip USE AS DIRECTED ONCE DAILY   predniSONE (DELTASONE) 50 MG tablet Take 1 tablet (50 mg total) by mouth daily with breakfast.   Semaglutide, 2 MG/DOSE, 8 MG/3ML SOPN Inject 2 mg as directed once a week.   [DISCONTINUED] atorvastatin (LIPITOR) 40 MG tablet Take 1 tablet (40 mg total) by mouth daily.   [DISCONTINUED] metoprolol succinate (TOPROL-XL) 25 MG 24 hr tablet Take 1 tablet (25 mg total) by mouth daily.     Allergies:   Methylprednisolone   Social History   Socioeconomic  History   Marital status: Married    Spouse name: Not on file   Number of children: Not on file   Years of education: Not on file   Highest education level: Not on file  Occupational History   Not on file  Tobacco Use   Smoking status: Former    Packs/day: 1    Types: Cigarettes    Quit date: 07/14/2021    Years since quitting: 1.1   Smokeless tobacco: Never  Vaping Use   Vaping Use: Never used  Substance and Sexual Activity   Alcohol use: Not Currently   Drug use: No   Sexual activity: Yes  Other Topics Concern   Not on file  Social History Narrative   Caffeine use: 2-3 per day (soda)   Social Determinants of Health   Financial Resource Strain: Not on file  Food Insecurity: No Food Insecurity (01/08/2022)   Hunger Vital Sign    Worried About Running Out of Food in the Last Year: Never true    Ran Out of Food in the Last Year: Never true  Transportation Needs: No Transportation Needs (01/08/2022)   PRAPARE - Administrator, Civil Service (Medical): No    Lack of Transportation (Non-Medical): No  Physical Activity: Not on file  Stress: Not on file  Social Connections: Not on file     Family History: The patient's family history includes Heart attack in his paternal grandfather; Hypertension in his father; Stroke in his paternal grandfather.  ROS:   Please see the history of present illness.     All other systems reviewed and are negative.  EKGs/Labs/Other Studies Reviewed:    The following studies were reviewed today:   EKG:  EKG is ordered today.  EKG shows sinus tachycardia, heart rate 103, otherwise normal ECG  Recent Labs: 12/24/2021: TSH 0.989 07/09/2022: ALT 33; BUN 7; Creatinine, Ser 1.06; Hemoglobin 15.8; Platelets 180; Potassium 3.6; Sodium 141  Recent Lipid Panel    Component Value Date/Time   CHOL 173 07/09/2022 1625   TRIG 268 (H) 07/09/2022 1625   HDL 33 (L) 07/09/2022 1625   LDLCALC 95 07/09/2022 1625     Risk  Assessment/Calculations:          Physical Exam:    VS:  BP 122/84 (BP Location: Left Arm, Patient Position: Sitting, Cuff Size: Normal)   Pulse (!) 103   Ht 5\' 8"  (1.727 m)   Wt 240 lb 3.2 oz (109 kg)   SpO2 96%   BMI 36.52 kg/m     Wt Readings from Last 3 Encounters:  09/13/22 240 lb 3.2 oz (109 kg)  09/05/22 243 lb 12.8 oz (110.6 kg)  07/09/22 245 lb 9.6 oz (111.4 kg)     GEN:  Well nourished, well developed in no acute distress HEENT: Normal NECK: No JVD; No carotid bruits LYMPHATICS: No lymphadenopathy CARDIAC: RRR, no murmurs, rubs, gallops RESPIRATORY:  Clear to auscultation  without rales, wheezing or rhonchi  ABDOMEN: Soft, non-tender, non-distended MUSCULOSKELETAL:  No edema; No deformity  SKIN: Warm and dry NEUROLOGIC:  Alert and oriented x 3 PSYCHIATRIC:  Normal affect   ASSESSMENT:    1. Coronary artery disease involving native coronary artery of native heart, unspecified whether angina present   2. Mixed hyperlipidemia    PLAN:    In order of problems listed above:  CAD, LHC 07/2021 70 to 90% D1-2 small for intervention, moderate LAD, OM 2, PDA disease.  Echocardiogram 60 to 65%.  Denies chest pain.  Continue aspirin 81 mg, Lopressor 25 mg twice daily, Lipitor to 40 mg daily. Hyperlipidemia, Lipitor 40 mg daily.  Obtain fasting lipid profile today.  Follow-up in 12 months.      Medication Adjustments/Labs and Tests Ordered: Current medicines are reviewed at length with the patient today.  Concerns regarding medicines are outlined above.  Orders Placed This Encounter  Procedures   Lipid panel   EKG 12-Lead   Meds ordered this encounter  Medications   atorvastatin (LIPITOR) 40 MG tablet    Sig: Take 1 tablet (40 mg total) by mouth daily.    Dispense:  90 tablet    Refill:  1   metoprolol succinate (TOPROL-XL) 25 MG 24 hr tablet    Sig: Take 1 tablet (25 mg total) by mouth daily.    Dispense:  90 tablet    Refill:  1    Patient Instructions   Medication Instructions:   Your physician recommends that you continue on your current medications as directed. Please refer to the Current Medication list given to you today.  *If you need a refill on your cardiac medications before your next appointment, please call your pharmacy*   Lab Work:  Your physician recommends you go to the medical mall for labs.   If you have labs (blood work) drawn today and your tests are completely normal, you will receive your results only by: MyChart Message (if you have MyChart) OR A paper copy in the mail If you have any lab test that is abnormal or we need to change your treatment, we will call you to review the results.   Testing/Procedures:  None Ordered   Follow-Up: At [facility], you and your health needs are our priority.  As part of our continuing mission to provide you with exceptional heart care, we have created designated Provider Care Teams.  These Care Teams include your primary Cardiologist (physician) and Advanced Practice Providers (APPs -  Physician Assistants and Nurse Practitioners) who all work together to provide you with the care you need, when you need it.  We recommend signing up for the patient portal called "MyChart".  Sign up information is provided on this After Visit Summary.  MyChart is used to connect with patients for Virtual Visits (Telemedicine).  Patients are able to view lab/test results, encounter notes, upcoming appointments, etc.  Non-urgent messages can be sent to your provider as well.   To learn more about what you can do with MyChart, go to [email or web address].    Your next appointment:   12 month(s)  Provider:   You may see Debbe Odea, MD or one of the following Advanced Practice Providers on your designated Care Team:   Nicolasa Ducking, NP Eula Listen, PA-C Cadence Fransico Michael, PA-C Charlsie Quest, NP     Signed, Debbe Odea, MD  09/13/2022 8:27 AM    [facility]

## 2022-09-13 NOTE — Patient Instructions (Signed)
Medication Instructions:   Your physician recommends that you continue on your current medications as directed. Please refer to the Current Medication list given to you today.  *If you need a refill on your cardiac medications before your next appointment, please call your pharmacy*   Lab Work:  Your physician recommends you go to the medical mall for labs.   If you have labs (blood work) drawn today and your tests are completely normal, you will receive your results only by: MyChart Message (if you have MyChart) OR A paper copy in the mail If you have any lab test that is abnormal or we need to change your treatment, we will call you to review the results.   Testing/Procedures:  None Ordered   Follow-Up: At [facility], you and your health needs are our priority.  As part of our continuing mission to provide you with exceptional heart care, we have created designated Provider Care Teams.  These Care Teams include your primary Cardiologist (physician) and Advanced Practice Providers (APPs -  Physician Assistants and Nurse Practitioners) who all work together to provide you with the care you need, when you need it.  We recommend signing up for the patient portal called "MyChart".  Sign up information is provided on this After Visit Summary.  MyChart is used to connect with patients for Virtual Visits (Telemedicine).  Patients are able to view lab/test results, encounter notes, upcoming appointments, etc.  Non-urgent messages can be sent to your provider as well.   To learn more about what you can do with MyChart, go to [email or web address].    Your next appointment:   12 month(s)  Provider:   You may see Debbe Odea, MD or one of the following Advanced Practice Providers on your designated Care Team:   Nicolasa Ducking, NP Eula Listen, PA-C Cadence Fransico Michael, PA-C Charlsie Quest, NP

## 2022-09-19 ENCOUNTER — Ambulatory Visit
Admission: RE | Admit: 2022-09-19 | Discharge: 2022-09-19 | Disposition: A | Discharge status: Home / Self Care | Payer: PRIVATE HEALTH INSURANCE | Attending: Family Medicine | Admitting: Family Medicine

## 2022-09-19 ENCOUNTER — Ambulatory Visit: Payer: PRIVATE HEALTH INSURANCE | Admitting: Family Medicine

## 2022-09-19 ENCOUNTER — Encounter: Payer: Self-pay | Admitting: Family Medicine

## 2022-09-19 ENCOUNTER — Ambulatory Visit
Admission: RE | Admit: 2022-09-19 | Discharge: 2022-09-19 | Disposition: A | Discharge status: Home / Self Care | Payer: PRIVATE HEALTH INSURANCE | Source: Ambulatory Visit | Attending: Family Medicine | Admitting: Family Medicine

## 2022-09-19 VITALS — BP 124/84 | HR 91 | Temp 98.2°F | Ht 68.0 in | Wt 241.2 lb

## 2022-09-19 DIAGNOSIS — R0689 Other abnormalities of breathing: Secondary | ICD-10-CM

## 2022-09-19 NOTE — Progress Notes (Signed)
BP 124/84   Pulse 91   Temp 98.2 F (36.8 C) (Oral)   Ht 5\' 8"  (1.727 m)   Wt 241 lb 3.2 oz (109.4 kg)   SpO2 97%   BMI 36.67 kg/m    Subjective:    Patient ID: Casey Cole, male    DOB: 1975/10/02, 47 y.o.   MRN: [id number]  HPI: Casey Cole is a 47 y.o. male  Chief Complaint  Patient presents with   Sinusitis   Cough    Patient says he feels a little better but still feels congested when he wakes up and has a cough.    UPPER RESPIRATORY TRACT INFECTION Duration: about 3-4 weeks Worst symptom: cough Fever: yes Cough: yes Shortness of breath: no Wheezing: no Chest pain: no Chest tightness: yes Chest congestion: yes Nasal congestion: yes Runny nose: no Post nasal drip: no Sneezing: no Sore throat: no Swollen glands: no Sinus pressure: yes Headache: no Face pain: no Toothache: no Ear pain: no  Ear pressure: no  Eyes red/itching:no Eye drainage/crusting: no  Vomiting: no Rash: no Fatigue: yes Sick contacts: no Strep contacts: no  Context: stable Recurrent sinusitis: no Relief with OTC cold/cough medications: no  Treatments attempted: cold/sinus, mucinex, anti-histamine, and antibiotics   Relevant past medical, surgical, family and social history reviewed and updated as indicated. Interim medical history since our last visit reviewed. Allergies and medications reviewed and updated.  Review of Systems  Constitutional:  Positive for chills, diaphoresis, fatigue and fever. Negative for activity change, appetite change and unexpected weight change.  HENT:  Positive for congestion, postnasal drip, rhinorrhea and sinus pressure. Negative for dental problem, drooling, ear discharge, ear pain, facial swelling, hearing loss, mouth sores, nosebleeds, sinus pain, sneezing, sore throat, tinnitus, trouble swallowing and voice change.   Eyes: Negative.   Respiratory:  Positive for cough and chest tightness. Negative for apnea, choking, shortness of breath, wheezing  and stridor.   Cardiovascular: Negative.   Gastrointestinal: Negative.   Neurological: Negative.   Psychiatric/Behavioral: Negative.      Per HPI unless specifically indicated above     Objective:    BP 124/84   Pulse 91   Temp 98.2 F (36.8 C) (Oral)   Ht 5\' 8"  (1.727 m)   Wt 241 lb 3.2 oz (109.4 kg)   SpO2 97%   BMI 36.67 kg/m   Wt Readings from Last 3 Encounters:  09/19/22 241 lb 3.2 oz (109.4 kg)  09/13/22 240 lb 3.2 oz (109 kg)  09/05/22 243 lb 12.8 oz (110.6 kg)    Physical Exam Vitals and nursing note reviewed.  Constitutional:      General: He is not in acute distress.    Appearance: Normal appearance. He is not ill-appearing, toxic-appearing or diaphoretic.  HENT:     Head: Normocephalic and atraumatic.     Right Ear: External ear normal.     Left Ear: External ear normal.     Nose: Nose normal.     Mouth/Throat:     Mouth: Mucous membranes are moist.     Pharynx: Oropharynx is clear.  Eyes:     General: No scleral icterus.       Right eye: No discharge.        Left eye: No discharge.     Extraocular Movements: Extraocular movements intact.     Conjunctiva/sclera: Conjunctivae normal.     Pupils: Pupils are equal, round, and reactive to light.  Cardiovascular:     Rate and Rhythm:  Normal rate and regular rhythm.     Pulses: Normal pulses.     Heart sounds: Normal heart sounds. No murmur heard.    No friction rub. No gallop.  Pulmonary:     Effort: Pulmonary effort is normal. No respiratory distress.     Breath sounds: No stridor. Rhonchi (L>R bases) present. No wheezing or rales.  Chest:     Chest wall: No tenderness.  Musculoskeletal:        General: Normal range of motion.     Cervical back: Normal range of motion and neck supple.  Skin:    General: Skin is warm and dry.     Capillary Refill: Capillary refill takes less than 2 seconds.     Coloration: Skin is not jaundiced or pale.     Findings: No bruising, erythema, lesion or rash.   Neurological:     General: No focal deficit present.     Mental Status: He is alert and oriented to person, place, and time. Mental status is at baseline.  Psychiatric:        Mood and Affect: Mood normal.        Behavior: Behavior normal.        Thought Content: Thought content normal.        Judgment: Judgment normal.     Results for orders placed or performed during the hospital encounter of 09/13/22  Lipid panel  Result Value Ref Range   Cholesterol 139 0 - 200 mg/dL   Triglycerides 478 (H) <150 mg/dL   HDL 34 (L) >29 mg/dL   Total CHOL/HDL Ratio 4.1 RATIO   VLDL 41 (H) 0 - 40 mg/dL   LDL Cholesterol 64 0 - 99 mg/dL      Assessment & Plan:   Problem List Items Addressed This Visit   None Visit Diagnoses     Abnormal breath sounds    -  Primary   Concern for pneumonia vs post infectious cough- will send for CXR and treat as needed.   Relevant Orders   DG Chest 2 View        Follow up plan: Return if symptoms worsen or fail to improve.

## 2022-09-23 ENCOUNTER — Encounter: Payer: Self-pay | Admitting: Family Medicine

## 2022-10-14 ENCOUNTER — Telehealth: Payer: Self-pay

## 2022-10-14 ENCOUNTER — Ambulatory Visit: Payer: PRIVATE HEALTH INSURANCE | Admitting: Family Medicine

## 2022-10-14 VITALS — BP 120/75 | HR 89 | Temp 98.3°F | Ht 68.0 in | Wt 241.4 lb

## 2022-10-14 DIAGNOSIS — Z7984 Long term (current) use of oral hypoglycemic drugs: Secondary | ICD-10-CM

## 2022-10-14 DIAGNOSIS — R809 Proteinuria, unspecified: Secondary | ICD-10-CM | POA: Diagnosis not present

## 2022-10-14 DIAGNOSIS — E1129 Type 2 diabetes mellitus with other diabetic kidney complication: Secondary | ICD-10-CM

## 2022-10-14 LAB — BAYER DCA HB A1C WAIVED: HB A1C (BAYER DCA - WAIVED): 8.1 % — ABNORMAL HIGH (ref 4.8–5.6)

## 2022-10-14 MED ORDER — GLUCOSE BLOOD VI STRP: ORAL_STRIP | 12 refills | Status: AC

## 2022-10-14 MED ORDER — METFORMIN HCL 500 MG PO TABS: 1000.0000 mg | ORAL_TABLET | Freq: Two times a day (BID) | ORAL | 0 refills | Status: DC

## 2022-10-14 NOTE — Assessment & Plan Note (Signed)
Elevated with A1c of 8.1 due to prednisone. Continue current regimen and recheck 3 months. Call with any concerns.

## 2022-10-14 NOTE — Telephone Encounter (Signed)
Casey Cole, [facility] calling from [facility] [id number] for clarification on glucose test strips. She is needing frequency. I reviewed chart and didn't find where CBGs were documented in OV notes so just advised of QD for now and when pt comes to pu if different can call back to advise. No further assistance needed.

## 2022-10-14 NOTE — Progress Notes (Signed)
BP 120/75   Pulse 89   Temp 98.3 F (36.8 C) (Oral)   Ht 5\' 8"  (1.727 m)   Wt 241 lb 6.4 oz (109.5 kg)   SpO2 95%   BMI 36.70 kg/m    Subjective:    Patient ID: Casey Cole, male    DOB: December 09, 1975, 47 y.o.   MRN: [id number]  HPI: Casey Cole is a 47 y.o. male  Chief Complaint  Patient presents with   Diabetes    Patient declines having a recent Diabetic Eye Exam.    DIABETES Hypoglycemic episodes:no Polydipsia/polyuria: no Visual disturbance: no Chest pain: no Paresthesias: no Glucose Monitoring: yes  Accucheck frequency: Daily Taking Insulin?: no Blood Pressure Monitoring: not checking Retinal Examination: Not up to Date Foot Exam: Up to Date Diabetic Education: Completed Pneumovax: Up to Date Influenza: Up to Date Aspirin: no   Relevant past medical, surgical, family and social history reviewed and updated as indicated. Interim medical history since our last visit reviewed. Allergies and medications reviewed and updated.  Review of Systems  Constitutional: Negative.   Respiratory: Negative.    Cardiovascular: Negative.   Gastrointestinal: Negative.   Musculoskeletal: Negative.   Neurological: Negative.   Psychiatric/Behavioral: Negative.      Per HPI unless specifically indicated above     Objective:    BP 120/75   Pulse 89   Temp 98.3 F (36.8 C) (Oral)   Ht 5\' 8"  (1.727 m)   Wt 241 lb 6.4 oz (109.5 kg)   SpO2 95%   BMI 36.70 kg/m   Wt Readings from Last 3 Encounters:  10/14/22 241 lb 6.4 oz (109.5 kg)  09/19/22 241 lb 3.2 oz (109.4 kg)  09/13/22 240 lb 3.2 oz (109 kg)    Physical Exam Vitals and nursing note reviewed.  Constitutional:      General: He is not in acute distress.    Appearance: Normal appearance. He is obese. He is not ill-appearing, toxic-appearing or diaphoretic.  HENT:     Head: Normocephalic and atraumatic.     Right Ear: External ear normal.     Left Ear: External ear normal.     Nose: Nose normal.      Mouth/Throat:     Mouth: Mucous membranes are moist.     Pharynx: Oropharynx is clear.  Eyes:     General: No scleral icterus.       Right eye: No discharge.        Left eye: No discharge.     Extraocular Movements: Extraocular movements intact.     Conjunctiva/sclera: Conjunctivae normal.     Pupils: Pupils are equal, round, and reactive to light.  Cardiovascular:     Rate and Rhythm: Normal rate and regular rhythm.     Pulses: Normal pulses.     Heart sounds: Normal heart sounds. No murmur heard.    No friction rub. No gallop.  Pulmonary:     Effort: Pulmonary effort is normal. No respiratory distress.     Breath sounds: Normal breath sounds. No stridor. No wheezing, rhonchi or rales.  Chest:     Chest wall: No tenderness.  Musculoskeletal:        General: Normal range of motion.     Cervical back: Normal range of motion and neck supple.  Skin:    General: Skin is warm and dry.     Capillary Refill: Capillary refill takes less than 2 seconds.     Coloration: Skin is not jaundiced or pale.  Findings: No bruising, erythema, lesion or rash.  Neurological:     General: No focal deficit present.     Mental Status: He is alert and oriented to person, place, and time. Mental status is at baseline.  Psychiatric:        Mood and Affect: Mood normal.        Behavior: Behavior normal.        Thought Content: Thought content normal.        Judgment: Judgment normal.     Results for orders placed or performed during the hospital encounter of 09/13/22  Lipid panel  Result Value Ref Range   Cholesterol 139 0 - 200 mg/dL   Triglycerides 962 (H) <150 mg/dL   HDL 34 (L) >95 mg/dL   Total CHOL/HDL Ratio 4.1 RATIO   VLDL 41 (H) 0 - 40 mg/dL   LDL Cholesterol 64 0 - 99 mg/dL      Assessment & Plan:   Problem List Items Addressed This Visit       Endocrine   DM (diabetes mellitus), type 2 with renal complications (HCC) - Primary    Elevated with A1c of 8.1 due to prednisone.  Continue current regimen and recheck 3 months. Call with any concerns.       Relevant Medications   metFORMIN (GLUCOPHAGE) 500 MG tablet   Other Relevant Orders   Bayer DCA Hb A1c Waived     Follow up plan: Return in about 3 months (around 01/14/2023) for physical.

## 2022-11-29 ENCOUNTER — Ambulatory Visit: Payer: PRIVATE HEALTH INSURANCE | Admitting: Family Medicine

## 2022-11-29 ENCOUNTER — Encounter: Payer: Self-pay | Admitting: Family Medicine

## 2022-11-29 VITALS — BP 126/86 | HR 98 | Temp 97.9°F | Wt 237.4 lb

## 2022-11-29 DIAGNOSIS — J189 Pneumonia, unspecified organism: Secondary | ICD-10-CM | POA: Diagnosis not present

## 2022-11-29 DIAGNOSIS — R053 Chronic cough: Secondary | ICD-10-CM | POA: Diagnosis not present

## 2022-11-29 MED ORDER — LEVOFLOXACIN 500 MG PO TABS: 500.0000 mg | ORAL_TABLET | Freq: Every day | ORAL | 0 refills | Status: AC

## 2022-11-29 MED ORDER — ALBUTEROL SULFATE HFA 108 (90 BASE) MCG/ACT IN AERS: 2.0000 | INHALATION_SPRAY | Freq: Four times a day (QID) | RESPIRATORY_TRACT | 6 refills | Status: DC | PRN

## 2022-11-29 MED ORDER — BUDESONIDE-FORMOTEROL FUMARATE 160-4.5 MCG/ACT IN AERO: 2.0000 | INHALATION_SPRAY | Freq: Two times a day (BID) | RESPIRATORY_TRACT | 3 refills | Status: DC

## 2022-11-29 MED ORDER — ALBUTEROL SULFATE (2.5 MG/3ML) 0.083% IN NEBU
2.5000 mg | INHALATION_SOLUTION | Freq: Once | RESPIRATORY_TRACT | Status: AC
Start: 2022-11-29 — End: 2022-11-29
  Administered 2022-11-29: 2.5 mg via RESPIRATORY_TRACT

## 2022-11-29 MED ORDER — MUPIROCIN 2 % EX OINT: 1.0000 | TOPICAL_OINTMENT | Freq: Two times a day (BID) | CUTANEOUS | 0 refills | Status: DC

## 2022-11-29 NOTE — Progress Notes (Signed)
BP 126/86   Pulse 98   Temp 97.9 F (36.6 C) (Oral)   Wt 237 lb 6.4 oz (107.7 kg)   SpO2 93%   BMI 36.10 kg/m    Subjective:    Patient ID: Casey Cole, male    DOB: February 06, 1976, 47 y.o.   MRN: [id number]  HPI: Casey Cole is a 47 y.o. male  Chief Complaint  Patient presents with   Cough    Pt states he has been coughing for months now and it will not go away.   Nasal Congestion   COUGH Duration: about 6 months Circumstances of initial development of cough:  pneumonia Cough severity: severe Cough description: productive and hacking Aggravating factors:  nothing Alleviating factors: nothing Status:  stable Treatments attempted: antibiotics, dayquil, steroids Wheezing: yes Shortness of breath: yes Chest pain: yes Chest tightness:yes Nasal congestion: yes Runny nose: yes Postnasal drip: yes Frequent throat clearing or swallowing: no Hemoptysis: no Fevers: no Night sweats: no Weight loss: no Heartburn: no Recent foreign travel: no Tuberculosis contacts: no  Relevant past medical, surgical, family and social history reviewed and updated as indicated. Interim medical history since our last visit reviewed. Allergies and medications reviewed and updated.  Review of Systems  Constitutional: Negative.   HENT:  Positive for congestion, postnasal drip and rhinorrhea. Negative for dental problem, drooling, ear discharge, ear pain, facial swelling, hearing loss, mouth sores, nosebleeds, sinus pressure, sinus pain, sneezing, sore throat, tinnitus, trouble swallowing and voice change.   Eyes: Negative.   Respiratory:  Positive for cough, chest tightness, shortness of breath and wheezing. Negative for apnea, choking and stridor.   Cardiovascular: Negative.   Gastrointestinal: Negative.   Musculoskeletal: Negative.   Psychiatric/Behavioral: Negative.      Per HPI unless specifically indicated above     Objective:    BP 126/86   Pulse 98   Temp 97.9 F (36.6 C)  (Oral)   Wt 237 lb 6.4 oz (107.7 kg)   SpO2 93%   BMI 36.10 kg/m   Wt Readings from Last 3 Encounters:  11/29/22 237 lb 6.4 oz (107.7 kg)  10/14/22 241 lb 6.4 oz (109.5 kg)  09/19/22 241 lb 3.2 oz (109.4 kg)    Physical Exam Vitals and nursing note reviewed.  Constitutional:      General: He is not in acute distress.    Appearance: Normal appearance. He is not ill-appearing, toxic-appearing or diaphoretic.  HENT:     Head: Normocephalic and atraumatic.     Right Ear: Tympanic membrane, ear canal and external ear normal.     Left Ear: Tympanic membrane, ear canal and external ear normal.     Nose: Nose normal. No congestion or rhinorrhea.     Mouth/Throat:     Mouth: Mucous membranes are moist.     Pharynx: Oropharynx is clear. No oropharyngeal exudate or posterior oropharyngeal erythema.  Eyes:     General: No scleral icterus.       Right eye: No discharge.        Left eye: No discharge.     Extraocular Movements: Extraocular movements intact.     Conjunctiva/sclera: Conjunctivae normal.     Pupils: Pupils are equal, round, and reactive to light.  Cardiovascular:     Rate and Rhythm: Normal rate and regular rhythm.     Pulses: Normal pulses.     Heart sounds: Normal heart sounds. No murmur heard.    No friction rub. No gallop.  Pulmonary:  Effort: Pulmonary effort is normal. No respiratory distress.     Breath sounds: No stridor. Rhonchi (R lung, upper and lower) present. No wheezing or rales.  Chest:     Chest wall: No tenderness.  Musculoskeletal:        General: Normal range of motion.     Cervical back: Normal range of motion and neck supple.  Skin:    General: Skin is warm and dry.     Capillary Refill: Capillary refill takes less than 2 seconds.     Coloration: Skin is not jaundiced or pale.     Findings: No bruising, erythema, lesion or rash.  Neurological:     General: No focal deficit present.     Mental Status: He is alert and oriented to person, place,  and time. Mental status is at baseline.  Psychiatric:        Mood and Affect: Mood normal.        Behavior: Behavior normal.        Thought Content: Thought content normal.        Judgment: Judgment normal.     Results for orders placed or performed in visit on 10/14/22  Bayer DCA Hb A1c Waived  Result Value Ref Range   HB A1C (BAYER DCA - WAIVED) 8.1 (H) 4.8 - 5.6 %      Assessment & Plan:   Problem List Items Addressed This Visit   None Visit Diagnoses     Chronic cough    -  Primary   Not significantly better following albuterol. Will check CXR and treat with levaquin and symbicort. Recheck 2 weeks. Call with any concerns.   Relevant Medications   albuterol (PROVENTIL) (2.5 MG/3ML) 0.083% nebulizer solution 2.5 mg (Completed)   Other Relevant Orders   DG Chest 2 View   Spirometry with graph (Completed)   Pneumonia of right lower lobe due to infectious organism       Will check CXR and treat with levaquin and symbicort. Recheck 2 weeks. Call with any concerns.   Relevant Medications   mupirocin ointment (BACTROBAN) 2 %   albuterol (PROVENTIL) (2.5 MG/3ML) 0.083% nebulizer solution 2.5 mg (Completed)   levofloxacin (LEVAQUIN) 500 MG tablet   budesonide-formoterol (SYMBICORT) 160-4.5 MCG/ACT inhaler   albuterol (VENTOLIN HFA) 108 (90 Base) MCG/ACT inhaler        Follow up plan: Return in about 2 weeks (around 12/13/2022).   >25 minutes spent with patient today

## 2022-12-02 ENCOUNTER — Ambulatory Visit
Admission: RE | Admit: 2022-12-02 | Discharge: 2022-12-02 | Disposition: A | Discharge status: Home / Self Care | Payer: PRIVATE HEALTH INSURANCE | Source: Home / Self Care | Attending: Family Medicine | Admitting: Family Medicine

## 2022-12-02 ENCOUNTER — Ambulatory Visit
Admission: RE | Admit: 2022-12-02 | Discharge: 2022-12-02 | Disposition: A | Discharge status: Home / Self Care | Payer: PRIVATE HEALTH INSURANCE | Source: Ambulatory Visit | Attending: Family Medicine | Admitting: Family Medicine

## 2022-12-02 DIAGNOSIS — R053 Chronic cough: Secondary | ICD-10-CM | POA: Insufficient documentation

## 2022-12-16 ENCOUNTER — Encounter: Payer: Self-pay | Admitting: Family Medicine

## 2022-12-16 ENCOUNTER — Ambulatory Visit: Payer: PRIVATE HEALTH INSURANCE | Admitting: Family Medicine

## 2022-12-16 VITALS — BP 141/80 | HR 99 | Temp 97.7°F | Wt 237.6 lb

## 2022-12-16 DIAGNOSIS — J4540 Moderate persistent asthma, uncomplicated: Secondary | ICD-10-CM

## 2022-12-16 NOTE — Progress Notes (Signed)
BP (!) 141/80   Pulse 99   Temp 97.7 F (36.5 C) (Oral)   Wt 237 lb 9.6 oz (107.8 kg)   SpO2 96%   BMI 36.13 kg/m    Subjective:    Patient ID: Casey Cole, male    DOB: [birth year], 47 y.o.   MRN: [id number]  HPI: Casey Cole is a 47 y.o. male  Chief Complaint  Patient presents with   Cough    Pt states the cough is still about the same    COUGH- better when taking his symbicort, but comes back when he's not coughing Duration: weeks Circumstances of initial development of cough:  pneumonia Cough severity: moderate Cough description: hacking  Aggravating factors:  nothing Alleviating factors: symbicort Status:  stable Treatments attempted: symbicort Wheezing: no Shortness of breath: no Chest pain: no Chest tightness:no Nasal congestion: no Runny nose: no Postnasal drip: no Frequent throat clearing or swallowing: no Hemoptysis: no Fevers: no Night sweats: no Weight loss: no Heartburn: no Recent foreign travel: no Tuberculosis contacts: no  Relevant past medical, surgical, family and social history reviewed and updated as indicated. Interim medical history since our last visit reviewed. Allergies and medications reviewed and updated.  Review of Systems  Constitutional: Negative.   Respiratory:  Positive for cough and wheezing. Negative for apnea, choking, chest tightness, shortness of breath and stridor.   Cardiovascular: Negative.   Musculoskeletal: Negative.   Neurological: Negative.   Psychiatric/Behavioral: Negative.      Per HPI unless specifically indicated above     Objective:    BP (!) 141/80   Pulse 99   Temp 97.7 F (36.5 C) (Oral)   Wt 237 lb 9.6 oz (107.8 kg)   SpO2 96%   BMI 36.13 kg/m   Wt Readings from Last 3 Encounters:  12/16/22 237 lb 9.6 oz (107.8 kg)  11/29/22 237 lb 6.4 oz (107.7 kg)  10/14/22 241 lb 6.4 oz (109.5 kg)    Physical Exam Vitals and nursing note reviewed.  Constitutional:      General: He is not in acute  distress.    Appearance: Normal appearance. He is obese. He is not ill-appearing, toxic-appearing or diaphoretic.  HENT:     Head: Normocephalic and atraumatic.     Right Ear: External ear normal.     Left Ear: External ear normal.     Nose: Nose normal.     Mouth/Throat:     Mouth: Mucous membranes are moist.     Pharynx: Oropharynx is clear.  Eyes:     General: No scleral icterus.       Right eye: No discharge.        Left eye: No discharge.     Extraocular Movements: Extraocular movements intact.     Conjunctiva/sclera: Conjunctivae normal.     Pupils: Pupils are equal, round, and reactive to light.  Cardiovascular:     Rate and Rhythm: Normal rate and regular rhythm.     Pulses: Normal pulses.     Heart sounds: Normal heart sounds. No murmur heard.    No friction rub. No gallop.  Pulmonary:     Effort: Pulmonary effort is normal. No respiratory distress.     Breath sounds: Normal breath sounds. No stridor. No wheezing, rhonchi or rales.  Chest:     Chest wall: No tenderness.  Musculoskeletal:        General: Normal range of motion.     Cervical back: Normal range of motion and neck supple.  Skin:    General: Skin is warm and dry.     Capillary Refill: Capillary refill takes less than 2 seconds.     Coloration: Skin is not jaundiced or pale.     Findings: No bruising, erythema, lesion or rash.  Neurological:     General: No focal deficit present.     Mental Status: He is alert and oriented to person, place, and time. Mental status is at baseline.  Psychiatric:        Mood and Affect: Mood normal.        Behavior: Behavior normal.        Thought Content: Thought content normal.        Judgment: Judgment normal.     Results for orders placed or performed in visit on 10/14/22  Bayer DCA Hb A1c Waived  Result Value Ref Range   HB A1C (BAYER DCA - WAIVED) 8.1 (H) 4.8 - 5.6 %      Assessment & Plan:   Problem List Items Addressed This Visit   None Visit Diagnoses      Moderate persistent reactive airway disease without complication    -  Primary   Will give sample of breztri. Check back in in 1 month. Call with any concerns.        Follow up plan: Return as scheduled.

## 2022-12-18 ENCOUNTER — Encounter: Payer: Self-pay | Admitting: Family Medicine

## 2023-01-14 ENCOUNTER — Encounter: Payer: PRIVATE HEALTH INSURANCE | Admitting: Family Medicine

## 2023-05-16 ENCOUNTER — Encounter: Payer: Self-pay | Admitting: Nurse Practitioner

## 2023-05-16 ENCOUNTER — Ambulatory Visit: Payer: Managed Care, Other (non HMO) | Admitting: Nurse Practitioner

## 2023-05-16 VITALS — BP 126/78 | HR 98 | Temp 97.8°F | Wt 240.0 lb

## 2023-05-16 DIAGNOSIS — R051 Acute cough: Secondary | ICD-10-CM

## 2023-05-16 DIAGNOSIS — J189 Pneumonia, unspecified organism: Secondary | ICD-10-CM | POA: Diagnosis not present

## 2023-05-16 LAB — VERITOR FLU A/B WAIVED
Influenza A: NEGATIVE
Influenza B: NEGATIVE

## 2023-05-16 MED ORDER — HYDROCOD POLI-CHLORPHE POLI ER 10-8 MG/5ML PO SUER: 5.0000 mL | Freq: Every evening | ORAL | 0 refills | Status: DC | PRN

## 2023-05-16 MED ORDER — AZITHROMYCIN 250 MG PO TABS: ORAL_TABLET | ORAL | 0 refills | Status: AC

## 2023-05-16 MED ORDER — BENZONATATE 100 MG PO CAPS: 100.0000 mg | ORAL_CAPSULE | Freq: Three times a day (TID) | ORAL | 0 refills | Status: DC | PRN

## 2023-05-16 MED ORDER — AMOXICILLIN-POT CLAVULANATE 875-125 MG PO TABS: 1.0000 | ORAL_TABLET | Freq: Two times a day (BID) | ORAL | 0 refills | Status: AC

## 2023-05-16 NOTE — Progress Notes (Signed)
 BP 126/78   Pulse 98   Temp 97.8 F (36.6 C) (Oral)   Wt 240 lb (108.9 kg)   SpO2 98%   BMI 36.49 kg/m    Subjective:    Patient ID: Casey Cole, male    DOB: 03-15-1976, 48 y.o.   MRN: [id number]  HPI: Casey Cole is a 48 y.o. male  Chief Complaint  Patient presents with   URI    Patient states he has been having a cough, sinus pressure, drainage, and congestion for the last 3 days   UPPER RESPIRATORY TRACT INFECTION Is on day 3 of not feeling well.  Started out with congestion and coughing.  No vaccines this season.  No Covid testing at home. Last A1c 8.1% in June.  Has been sleeping on the couch. Fever: no Cough: yes Shortness of breath: no Wheezing: yes Chest pain: no Chest tightness: yes Chest congestion: yes Nasal congestion: yes Runny nose: yes Post nasal drip: yes Sneezing: no Sore throat: yes from drainage Swollen glands: no Sinus pressure: yes Headache: yes Face pain: yes Toothache: no Ear pain: yes left Ear pressure: yes bilateral Eyes red/itching:no Eye drainage/crusting: no  Vomiting: no Rash: no Fatigue: yes Sick contacts: no Strep contacts: no  Context: worse Recurrent sinusitis: no Relief with OTC cold/cough medications: yes  Treatments attempted: Dayquil   Relevant past medical, surgical, family and social history reviewed and updated as indicated. Interim medical history since our last visit reviewed. Allergies and medications reviewed and updated.  Review of Systems  Constitutional:  Positive for fatigue. Negative for activity change, appetite change, chills and fever.  HENT:  Positive for congestion, ear pain, postnasal drip, rhinorrhea, sinus pressure, sinus pain and sore throat. Negative for ear discharge, sneezing and voice change.   Respiratory:  Positive for cough, chest tightness and wheezing. Negative for shortness of breath.   Cardiovascular:  Negative for chest pain, palpitations and leg swelling.  Gastrointestinal:  Negative.   Neurological:  Positive for headaches. Negative for dizziness and weakness.  Psychiatric/Behavioral: Negative.      Per HPI unless specifically indicated above     Objective:    BP 126/78   Pulse 98   Temp 97.8 F (36.6 C) (Oral)   Wt 240 lb (108.9 kg)   SpO2 98%   BMI 36.49 kg/m   Wt Readings from Last 3 Encounters:  05/16/23 240 lb (108.9 kg)  12/16/22 237 lb 9.6 oz (107.8 kg)  11/29/22 237 lb 6.4 oz (107.7 kg)    Physical Exam Vitals and nursing note reviewed.  Constitutional:      General: He is awake. He is not in acute distress.    Appearance: He is well-developed and well-groomed. He is obese. He is ill-appearing. He is not toxic-appearing.  HENT:     Head: Normocephalic.     Right Ear: Hearing, ear canal and external ear normal. No tenderness. A middle ear effusion is present. Tympanic membrane is not injected or perforated.     Left Ear: Hearing, ear canal and external ear normal. No tenderness. A middle ear effusion is present. Tympanic membrane is injected. Tympanic membrane is not perforated.     Nose: Rhinorrhea present. Rhinorrhea is clear.     Right Sinus: Frontal sinus tenderness present. No maxillary sinus tenderness.     Left Sinus: Frontal sinus tenderness present. No maxillary sinus tenderness.     Mouth/Throat:     Mouth: Mucous membranes are moist.     Pharynx: Posterior oropharyngeal  erythema (mild) present. No pharyngeal swelling or oropharyngeal exudate.  Eyes:     General: Lids are normal.     Extraocular Movements: Extraocular movements intact.     Conjunctiva/sclera: Conjunctivae normal.  Neck:     Thyroid : No thyromegaly.     Vascular: No carotid bruit.  Cardiovascular:     Rate and Rhythm: Normal rate and regular rhythm.     Heart sounds: Normal heart sounds.  Pulmonary:     Effort: No accessory muscle usage or respiratory distress.     Breath sounds: Wheezing and rales present. No decreased breath sounds or rhonchi.      Comments: Noted throughout lung fields expiratory wheezes and rales to lower bases. Abdominal:     General: Bowel sounds are normal. There is no distension.     Palpations: Abdomen is soft.     Tenderness: There is no abdominal tenderness.  Musculoskeletal:     Cervical back: Full passive range of motion without pain.     Right lower leg: No edema.     Left lower leg: No edema.  Lymphadenopathy:     Head:     Right side of head: No submental, submandibular, tonsillar, preauricular or posterior auricular adenopathy.     Left side of head: No submental, submandibular, tonsillar, preauricular or posterior auricular adenopathy.     Cervical: No cervical adenopathy.  Skin:    General: Skin is warm.     Capillary Refill: Capillary refill takes less than 2 seconds.  Neurological:     Mental Status: He is alert and oriented to person, place, and time.     Deep Tendon Reflexes: Reflexes are normal and symmetric.     Reflex Scores:      Brachioradialis reflexes are 2+ on the right side and 2+ on the left side.      Patellar reflexes are 2+ on the right side and 2+ on the left side. Psychiatric:        Attention and Perception: Attention normal.        Mood and Affect: Mood normal.        Speech: Speech normal.        Behavior: Behavior normal. Behavior is cooperative.        Thought Content: Thought content normal.     Results for orders placed or performed in visit on 10/14/22  Bayer DCA Hb A1c Waived   Collection Time: 10/14/22  4:06 PM  Result Value Ref Range   HB A1C (BAYER DCA - WAIVED) 8.1 (H) 4.8 - 5.6 %      Assessment & Plan:   Problem List Items Addressed This Visit       Respiratory   Pneumonia   Acute, high suspicion for pneumonia based on assessment.  Flu negative, Covid test sent.  Unable to get imaging today and going into bad weather this evening.  Will treat with Augmentin  and Azithromycin  for CAP and higher risk with diabetes.  Educated him on this. Tussionex and  Tessalon  sent in for as needed use + recommend he use his Albuterol  at home as needed.  Recommend: - Increased rest - Increasing Fluids - Acetaminophen  as needed for fever/pain.  - Salt water  gargling, chloraseptic spray and throat lozenges - OTC Diabetic Tussin - Mucinex.  - Humidifying the air.       Relevant Medications   amoxicillin -clavulanate (AUGMENTIN ) 875-125 MG tablet   azithromycin  (ZITHROMAX ) 250 MG tablet   chlorpheniramine-HYDROcodone  (TUSSIONEX) 10-8 MG/5ML   benzonatate  (TESSALON   PERLES) 100 MG capsule   Other Visit Diagnoses       Acute cough    -  Primary   Relevant Orders   Influenza A & B (STAT)   Novel Coronavirus, NAA ([facility])        Follow up plan: Return if symptoms worsen or fail to improve.

## 2023-05-16 NOTE — Patient Instructions (Signed)

## 2023-05-16 NOTE — Assessment & Plan Note (Addendum)
 Acute, high suspicion for pneumonia based on assessment.  Flu negative, Covid test sent.  Unable to get imaging today and going into bad weather this evening.  Will treat with Augmentin  and Azithromycin  for CAP and higher risk with diabetes.  Educated him on this. Tussionex and Tessalon  sent in for as needed use + recommend he use his Albuterol  at home as needed.  Recommend: - Increased rest - Increasing Fluids - Acetaminophen  as needed for fever/pain.  - Salt water  gargling, chloraseptic spray and throat lozenges - OTC Diabetic Tussin - Mucinex.  - Humidifying the air.

## 2023-05-17 LAB — NOVEL CORONAVIRUS, NAA: SARS-CoV-2, NAA: NOT DETECTED

## 2023-05-17 NOTE — Progress Notes (Signed)
Contacted via [place] ? ? ?Covid is negative!!!

## 2023-05-20 IMAGING — CR DG CHEST 2V
1 series · 2 of 2 positions shown · non-contrast
Comparison: November 30, 2020.

CLINICAL DATA: A 45-year-old male presents for evaluation of chest
pain and shortness of breath with tightness for 1 week.

EXAM:
CHEST - 2 VIEW

[Series 1: dg chest 2 view · 0.14mm/px · 2 of 2 slices shown]
[im 1/2]
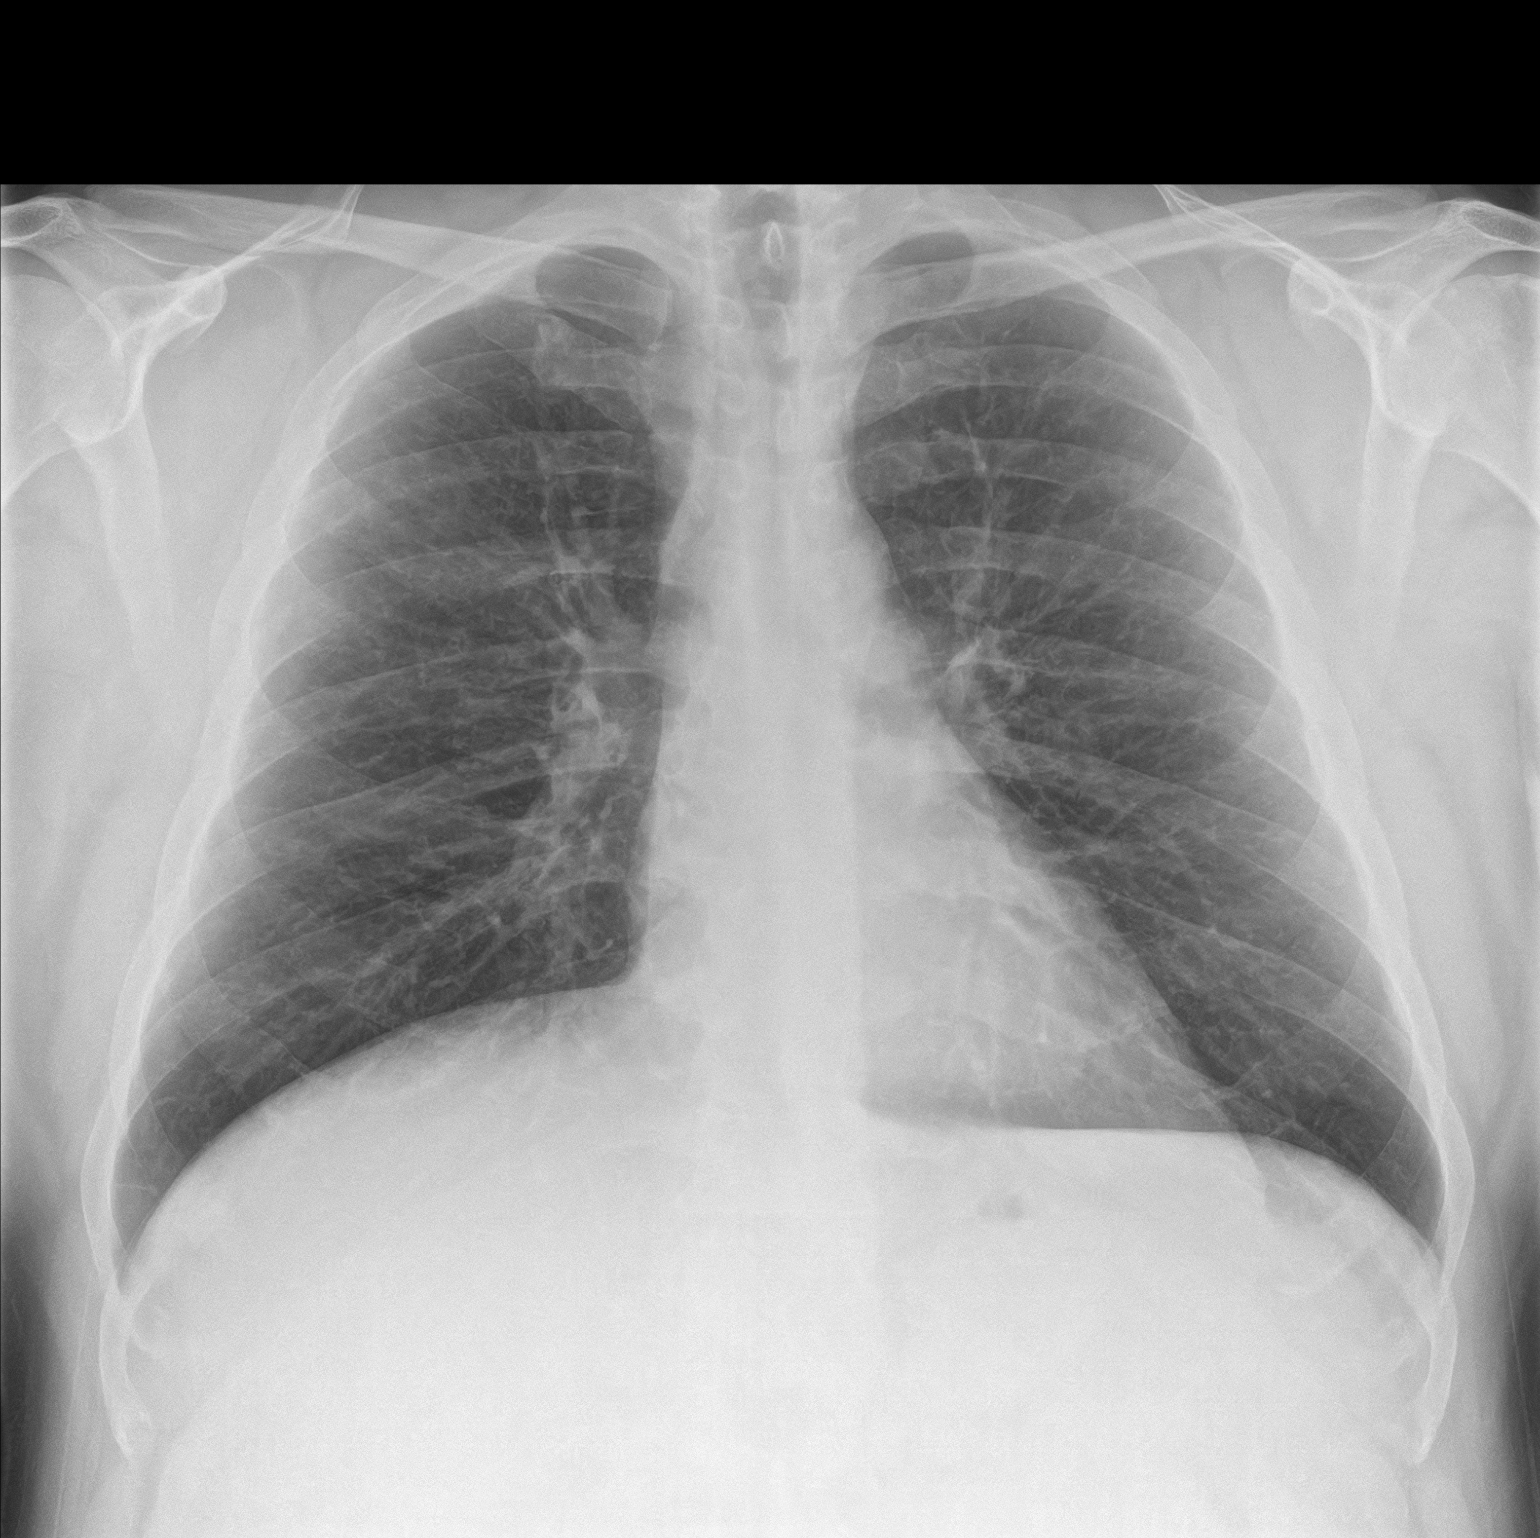
[im 2/2]
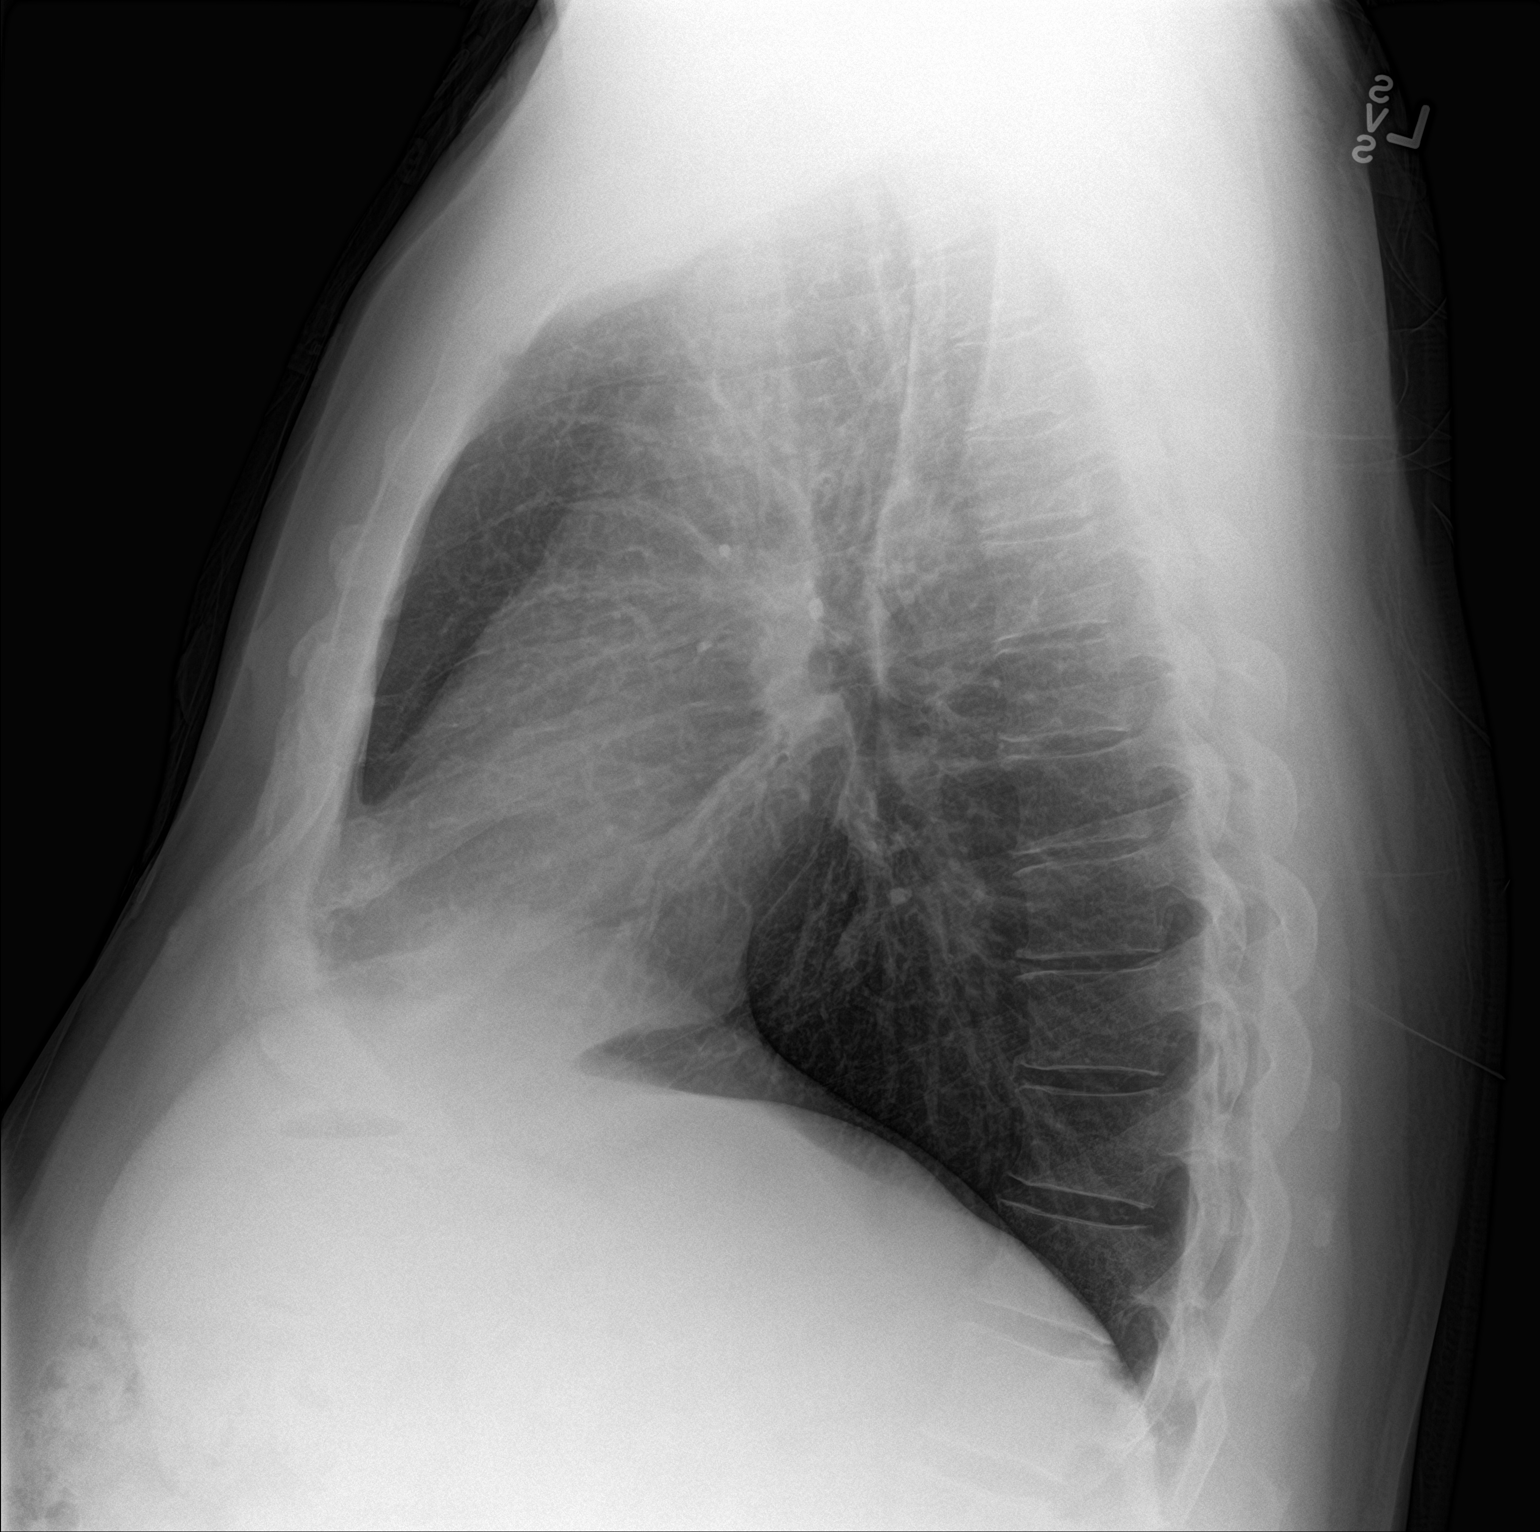

[2 of 2 positions shown; findings below may reference images not displayed]

FINDINGS: Cardiomediastinal contours and hilar structures are normal.

No lobar consolidation. No sign of pleural effusion. No visible
pneumothorax.

On limited assessment there is no acute skeletal process. Subtle
added density projecting over the LEFT posterior fifth rib which is
asymmetric but not overlapping the anterior rib ir other bony
structures on the frontal projection.
IMPRESSION: Subtle nodular focus projecting over the LEFT posterior fifth rib on
the frontal projection may represent confluence of shadows, post
infectious or inflammatory changes or small nodule. If there are
respiratory symptoms that suggest infection would suggest 6-12 week
follow-up after therapy with PA and lateral chest radiograph or CT.

## 2023-06-27 IMAGING — CT CT HEART MORP W/ CTA COR W/ SCORE W/ CA W/CM &/OR W/O CM
1 of 13 series · 4 of 20 positions shown, 5 images · non-contrast
Comparison: 06/21/2021 chest radiograph.

Addendum:
CLINICAL DATA: Chest pain

EXAM:
Cardiac/Coronary  CTA
TECHNIQUE: The patient was scanned on a Siemens Somatom go.Top scanner.

[Series 28: multiphase % cta coronary 0.60 · axial · 0.34mm/px · z∈[-1159,-1087]mm · 4 of 3624 slices shown, 5 images]
[im 725/3624  vessel]
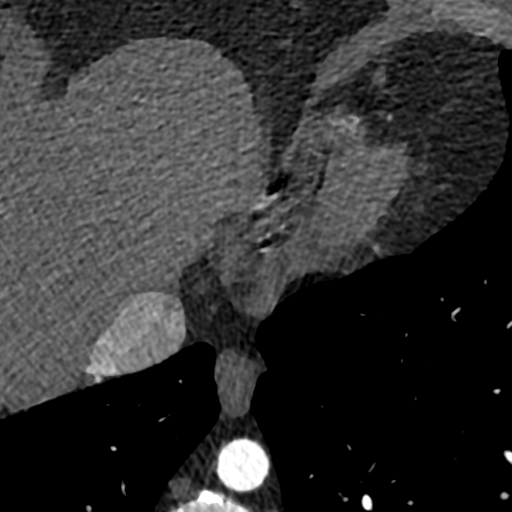
[im 725/3624  lung]
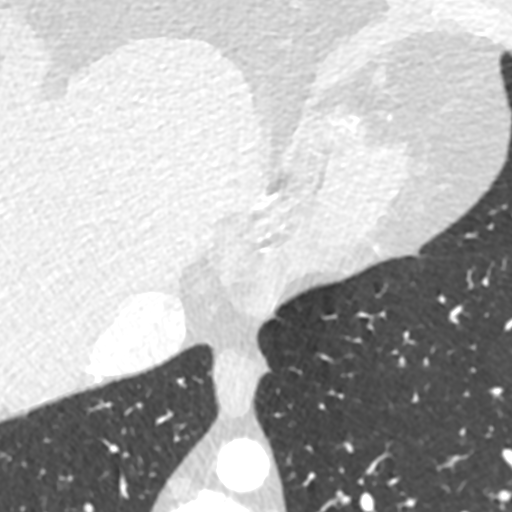
[im 1450/3624  vessel]
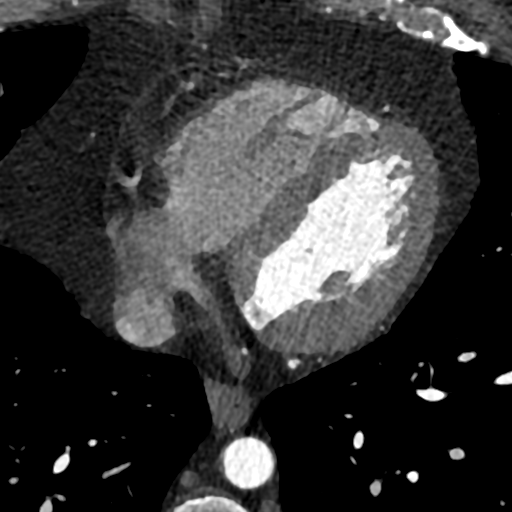
[im 2174/3624  vessel]
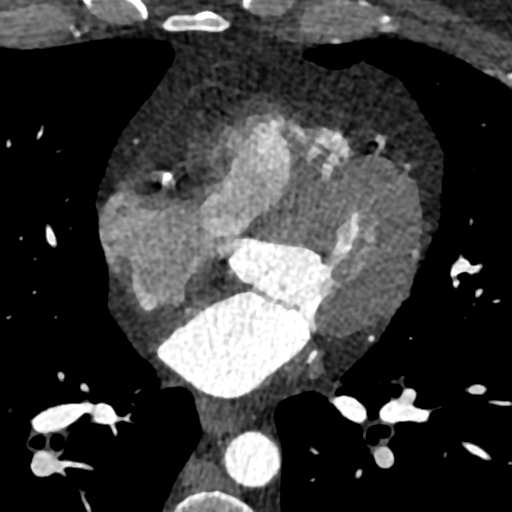
[im 2899/3624  vessel]
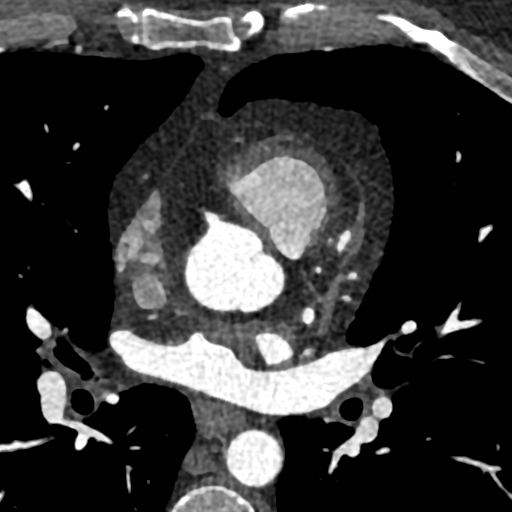

[4 of 20 positions shown; findings below may reference images not displayed]

:
A retrospective scan was triggered in the descending thoracic aorta.
Axial non-contrast 3 mm slices were carried out through the heart.
The data set was analyzed on a dedicated work station and scored
using the Agatson method. Gantry rotation speed was 330 msecs and
collimation was .6 mm. 100mg of metoprolol, 15mg ivabradine and
mg of sl NTG was given. The 3D data set was reconstructed in 5%
intervals of the 60-95 % of the R-R cycle. Diastolic phases were
analyzed on a dedicated work station using MPR, MIP and VRT modes.
The patient received 100 cc of contrast.
FINDINGS: Aorta:  Normal size.  No calcifications.  No dissection.

Aortic Valve:  Trileaflet.  No calcifications.

Coronary Arteries:  Normal coronary origin.  Right dominance.

RCA is a dominant artery that gives rise to PDA and PLA. There is
non calcified plaque causing severe stenosis in the distal
RCA/ostial PDA (>70%).

Left main is a large artery that gives rise to LAD and LCX arteries.

LAD has non calcified plaque in the mid segment causing moderate
stenosis (50%).

LCX is a non-dominant artery that gives rise to two obtuse marginal
branches. There is non calcified plaque in the mid LCx causing
severe stenosis (>70%).

Other findings:

Normal pulmonary vein drainage into the left atrium.

Normal left atrial appendage without a thrombus.

Normal size of the pulmonary artery.
IMPRESSION: 1. Coronary calcium score of 116.

2. Normal coronary origin with right dominance.

3. Non calcified plaque causing severe stenosis in the distal
RCA/prox PDA (>70%).

4. Non calcified plaque causing severe stenosis in the mid
LCx/ostial OM2 (>70%).

5. Non calcified plaque causing moderate mid LAD stenosis (50%).

6. CAD-RADS 4 Severe stenosis. (70-99% or > 50% left main). Cardiac
catheterization is recommended.

7. Additional analysis with CT FFR will be submitted and reported
separately.

EXAM:
OVER-READ INTERPRETATION  CT CHEST

The following report is an over-read performed by radiologist Dr.
does not include interpretation of cardiac or coronary anatomy or
pathology. The coronary CTA interpretation by the cardiologist is
attached.
FINDINGS: Cardiovascular: Normal heart size. No significant pericardial
effusion/thickening. Great vessels are normal in course and caliber.
No central pulmonary emboli.

Mediastinum/Nodes: Unremarkable esophagus. No pathologically
enlarged mediastinal or hilar lymph nodes, noting limited
sensitivity for the detection of hilar adenopathy on this
noncontrast study.

Lungs/Pleura: No pneumothorax. No pleural effusion. No acute
consolidative airspace disease or lung masses. Solid 0.3 cm
peripheral right lower lobe pulmonary nodule (series 14/image 31).
No additional significant pulmonary nodules.

Upper abdomen: No acute abnormality.

Musculoskeletal: No aggressive appearing focal osseous lesions. Mild
thoracic spondylosis.
IMPRESSION: Solitary 0.3 cm right lower lobe pulmonary nodule. No follow-up
needed if patient is low-risk.This recommendation follows the
consensus statement: Guidelines for Management of Incidental
Pulmonary Nodules Detected on CT Images: From the [HOSPITAL]

*** End of Addendum ***
:
A retrospective scan was triggered in the descending thoracic aorta.
Axial non-contrast 3 mm slices were carried out through the heart.
The data set was analyzed on a dedicated work station and scored
using the Agatson method. Gantry rotation speed was 330 msecs and
collimation was .6 mm. 100mg of metoprolol, 15mg ivabradine and
mg of sl NTG was given. The 3D data set was reconstructed in 5%
intervals of the 60-95 % of the R-R cycle. Diastolic phases were
analyzed on a dedicated work station using MPR, MIP and VRT modes.
The patient received 100 cc of contrast.
FINDINGS: Aorta:  Normal size.  No calcifications.  No dissection.

Aortic Valve:  Trileaflet.  No calcifications.

Coronary Arteries:  Normal coronary origin.  Right dominance.

RCA is a dominant artery that gives rise to PDA and PLA. There is
non calcified plaque causing severe stenosis in the distal
RCA/ostial PDA (>70%).

Left main is a large artery that gives rise to LAD and LCX arteries.

LAD has non calcified plaque in the mid segment causing moderate
stenosis (50%).

LCX is a non-dominant artery that gives rise to two obtuse marginal
branches. There is non calcified plaque in the mid LCx causing
severe stenosis (>70%).

Other findings:

Normal pulmonary vein drainage into the left atrium.

Normal left atrial appendage without a thrombus.

Normal size of the pulmonary artery.
IMPRESSION: 1. Coronary calcium score of 116.

2. Normal coronary origin with right dominance.

3. Non calcified plaque causing severe stenosis in the distal
RCA/prox PDA (>70%).

4. Non calcified plaque causing severe stenosis in the mid
LCx/ostial OM2 (>70%).

5. Non calcified plaque causing moderate mid LAD stenosis (50%).

6. CAD-RADS 4 Severe stenosis. (70-99% or > 50% left main). Cardiac
catheterization is recommended.

7. Additional analysis with CT FFR will be submitted and reported
separately.

## 2023-09-01 ENCOUNTER — Other Ambulatory Visit: Payer: Self-pay | Admitting: Family Medicine

## 2023-09-01 LAB — HM DIABETES EYE EXAM

## 2023-09-01 NOTE — Telephone Encounter (Signed)
 Copied from CRM [id number]. Topic: Clinical - Medication Refill >> Sep 01, 2023  3:08 PM Lorrane Rosette wrote: Most Recent Primary Care Visit:  Provider: CANNADY, JOLENE T  Department: [id number]  Visit Type: OFFICE VISIT  Date: 05/16/2023  Medication: Semaglutide , 2 MG/DOSE, 8 MG/3ML SOPN  Has the patient contacted their pharmacy? Yes (Agent: If no, request that the patient contact the pharmacy for the refill. If patient does not wish to contact the pharmacy document the reason why and proceed with request.) (Agent: If yes, when and what did the pharmacy advise?)  Is this the correct pharmacy for this prescription? Yes If no, delete pharmacy and type the correct one.  This is the patient's preferred pharmacy:  [id number] - [place], [place] - [address] AT [facility] OF [address] [place] [address] Phone: [phone] Fax: [phone]   Has the prescription been filled recently? No  Is the patient out of the medication? Yes  Has the patient been seen for an appointment in the last year OR does the patient have an upcoming appointment? Yes  Can we respond through MyChart? Yes  Agent: Please be advised that Rx refills may take up to 3 business days. We ask that you follow-up with your pharmacy.

## 2023-09-03 NOTE — Telephone Encounter (Signed)
 Requested medication (s) are due for refill today: yes  Requested medication (s) are on the active medication list: yes  Last refill: 07/09/22 9 ml 1 RF  Future visit scheduled:yes  Notes to clinic:  overdue lab work    Requested Prescriptions  Pending Prescriptions Disp Refills   Semaglutide , 2 MG/DOSE, 8 MG/3ML SOPN 9 mL 1    Sig: Inject 2 mg as directed once a week.     Endocrinology:  Diabetes - GLP-1 Receptor Agonists - semaglutide  Failed - 09/03/2023  4:07 PM      Failed - HBA1C in normal range and within 180 days    Hemoglobin A1C  Date Value Ref Range Status  01/19/2016 7.8  Final   HB A1C (BAYER DCA - WAIVED)  Date Value Ref Range Status  10/14/2022 8.1 (H) 4.8 - 5.6 % Final    Comment:             Prediabetes: 5.7 - 6.4          Diabetes: >6.4          Glycemic control for adults with diabetes: <7.0          Failed - Cr in normal range and within 360 days    Creatinine, Ser  Date Value Ref Range Status  07/09/2022 1.06 0.76 - 1.27 mg/dL Final         Failed - Valid encounter within last 6 months    Recent Outpatient Visits   None

## 2023-09-05 ENCOUNTER — Encounter: Payer: Self-pay | Admitting: Family Medicine

## 2023-09-08 ENCOUNTER — Other Ambulatory Visit: Payer: Self-pay

## 2023-09-08 MED ORDER — SEMAGLUTIDE (2 MG/DOSE) 8 MG/3ML ~~LOC~~ SOPN: 2.0000 mg | PEN_INJECTOR | SUBCUTANEOUS | 0 refills | Status: DC

## 2023-09-17 ENCOUNTER — Other Ambulatory Visit: Payer: Self-pay

## 2023-09-17 MED ORDER — SEMAGLUTIDE (2 MG/DOSE) 8 MG/3ML ~~LOC~~ SOPN: 2.0000 mg | PEN_INJECTOR | SUBCUTANEOUS | 3 refills | Status: DC

## 2023-09-17 NOTE — Telephone Encounter (Signed)
 Can we please check on why he can't get this?

## 2023-09-22 ENCOUNTER — Other Ambulatory Visit: Payer: Self-pay

## 2023-09-22 MED ORDER — SEMAGLUTIDE (2 MG/DOSE) 8 MG/3ML ~~LOC~~ SOPN: 2.0000 mg | PEN_INJECTOR | SUBCUTANEOUS | 3 refills | Status: DC

## 2023-09-26 ENCOUNTER — Other Ambulatory Visit (HOSPITAL_COMMUNITY): Payer: Self-pay

## 2023-09-30 ENCOUNTER — Other Ambulatory Visit (HOSPITAL_COMMUNITY): Payer: Self-pay

## 2023-10-09 ENCOUNTER — Encounter: Payer: Self-pay | Admitting: Family Medicine

## 2023-10-09 ENCOUNTER — Ambulatory Visit: Payer: PRIVATE HEALTH INSURANCE | Admitting: Family Medicine

## 2023-10-09 VITALS — BP 148/90 | HR 83 | Temp 97.7°F | Ht 68.0 in | Wt 240.6 lb

## 2023-10-09 DIAGNOSIS — R809 Proteinuria, unspecified: Secondary | ICD-10-CM

## 2023-10-09 DIAGNOSIS — E782 Mixed hyperlipidemia: Secondary | ICD-10-CM | POA: Diagnosis not present

## 2023-10-09 DIAGNOSIS — E1129 Type 2 diabetes mellitus with other diabetic kidney complication: Secondary | ICD-10-CM

## 2023-10-09 DIAGNOSIS — Z79899 Other long term (current) drug therapy: Secondary | ICD-10-CM

## 2023-10-09 DIAGNOSIS — Z7985 Long-term (current) use of injectable non-insulin antidiabetic drugs: Secondary | ICD-10-CM

## 2023-10-09 DIAGNOSIS — I129 Hypertensive chronic kidney disease with stage 1 through stage 4 chronic kidney disease, or unspecified chronic kidney disease: Secondary | ICD-10-CM

## 2023-10-09 DIAGNOSIS — J069 Acute upper respiratory infection, unspecified: Secondary | ICD-10-CM | POA: Diagnosis not present

## 2023-10-09 LAB — MICROALBUMIN, URINE WAIVED
Creatinine, Urine Waived: 300 mg/dL (ref 10–300)
Microalb, Ur Waived: 30 mg/L — ABNORMAL HIGH (ref 0–19)
Microalb/Creat Ratio: <30 mg/g (ref <30)

## 2023-10-09 LAB — BAYER DCA HB A1C WAIVED: HB A1C (BAYER DCA - WAIVED): 8.9 % — ABNORMAL HIGH (ref 4.8–5.6)

## 2023-10-09 NOTE — Progress Notes (Signed)
 BP (!) 148/90   Pulse 83   Temp 97.7 F (36.5 C) (Oral)   Ht 5' 8 (1.727 m)   Wt 240 lb 9.6 oz (109.1 kg)   SpO2 96%   BMI 36.58 kg/m    Subjective:    Patient ID: Casey Cole, male    DOB: 24-Jun-1975, 48 y.o.   MRN: [id number]  HPI: Casey Cole is a 48 y.o. male  Chief Complaint  Patient presents with   Fatigue   Cough   Nasal Congestion   Fever   UPPER RESPIRATORY TRACT INFECTION Duration: about 4-5 days Worst symptom: congestion Fever: yes Cough: yes Shortness of breath: no Wheezing: no Chest pain: no Chest tightness: no Chest congestion: no Nasal congestion: yes Runny nose: yes Post nasal drip: yes Sneezing: yes Sore throat: yes Swollen glands: no Sinus pressure: yes Headache: yes Face pain: yes Toothache: yes Ear pain: yes left Ear pressure: yes  Eyes red/itching:yes Eye drainage/crusting: yes  Vomiting: yes Rash: no Fatigue: yes Sick contacts: no Strep contacts: no  Context: stable Recurrent sinusitis: no Relief with OTC cold/cough medications: no  Treatments attempted: dayquil  DIABETES Hypoglycemic episodes:no Polydipsia/polyuria: no Visual disturbance: no Chest pain: no Paresthesias: no Glucose Monitoring: no  Accucheck frequency: Not Checking Taking Insulin?: no Blood Pressure Monitoring: rarely Retinal Examination: Up to Date Foot Exam: Up to Date Diabetic Education: Completed Pneumovax: Up to Date Influenza: Up to Date Aspirin : yes   HYPERTENSION / HYPERLIPIDEMIA Satisfied with current treatment? yes Duration of hypertension: chronic BP monitoring frequency: not checking BP medication side effects: no Past BP meds: losartan, metoprolol  Duration of hyperlipidemia: chronic Cholesterol medication side effects: no Cholesterol supplements: none Past cholesterol medications: atorvastatin  Medication compliance: excellent compliance Aspirin : yes Recent stressors: no Recurrent headaches: no Visual changes:  no Palpitations: no Dyspnea: no Chest pain: no Lower extremity edema: no Dizzy/lightheaded: no  Relevant past medical, surgical, family and social history reviewed and updated as indicated. Interim medical history since our last visit reviewed. Allergies and medications reviewed and updated.  Review of Systems  Constitutional: Negative.   HENT:  Positive for congestion, postnasal drip, sinus pressure, sinus pain and sore throat. Negative for dental problem, drooling, ear discharge, ear pain, facial swelling, hearing loss, mouth sores, nosebleeds, rhinorrhea, sneezing, tinnitus, trouble swallowing and voice change.   Respiratory:  Positive for cough, shortness of breath and wheezing. Negative for apnea, choking, chest tightness and stridor.   Cardiovascular: Negative.   Musculoskeletal: Negative.   Neurological: Negative.   Psychiatric/Behavioral: Negative.      Per HPI unless specifically indicated above     Objective:     BP (!) 148/90   Pulse 83   Temp 97.7 F (36.5 C) (Oral)   Ht 5' 8 (1.727 m)   Wt 240 lb 9.6 oz (109.1 kg)   SpO2 96%   BMI 36.58 kg/m   Wt Readings from Last 3 Encounters:  10/09/23 240 lb 9.6 oz (109.1 kg)  05/16/23 240 lb (108.9 kg)  12/16/22 237 lb 9.6 oz (107.8 kg)    Physical Exam Vitals and nursing note reviewed.  Constitutional:      General: He is not in acute distress.    Appearance: Normal appearance. He is not ill-appearing, toxic-appearing or diaphoretic.  HENT:     Head: Normocephalic and atraumatic.     Right Ear: Tympanic membrane, ear canal and external ear normal.     Left Ear: Tympanic membrane and external ear normal.     Nose:  Congestion and rhinorrhea present.     Mouth/Throat:     Mouth: Mucous membranes are moist.     Pharynx: Oropharynx is clear. No oropharyngeal exudate or posterior oropharyngeal erythema.   Eyes:     General: No scleral icterus.       Right eye: No discharge.        Left eye: No discharge.      Extraocular Movements: Extraocular movements intact.     Conjunctiva/sclera: Conjunctivae normal.     Pupils: Pupils are equal, round, and reactive to light.    Cardiovascular:     Rate and Rhythm: Normal rate and regular rhythm.     Pulses: Normal pulses.     Heart sounds: Normal heart sounds. No murmur heard.    No friction rub. No gallop.  Pulmonary:     Effort: Pulmonary effort is normal. No respiratory distress.     Breath sounds: No stridor. Wheezing present. No rhonchi or rales.  Chest:     Chest wall: No tenderness.   Musculoskeletal:        General: Normal range of motion.     Cervical back: Normal range of motion and neck supple.   Skin:    General: Skin is warm and dry.     Capillary Refill: Capillary refill takes less than 2 seconds.     Coloration: Skin is not jaundiced or pale.     Findings: No bruising, erythema, lesion or rash.   Neurological:     General: No focal deficit present.     Mental Status: He is alert and oriented to person, place, and time. Mental status is at baseline.   Psychiatric:        Mood and Affect: Mood normal.        Behavior: Behavior normal.        Thought Content: Thought content normal.        Judgment: Judgment normal.     Results for orders placed or performed in visit on 10/09/23  Bayer DCA Hb A1c Waived   Collection Time: 10/09/23  8:38 AM  Result Value Ref Range   HB A1C (BAYER DCA - WAIVED) 8.9 (H) 4.8 - 5.6 %  Microalbumin, Urine Waived   Collection Time: 10/09/23  8:38 AM  Result Value Ref Range   Microalb, Ur Waived 30 (H) 0 - 19 mg/L   Creatinine, Urine Waived 300 10 - 300 mg/dL   Microalb/Creat Ratio <30 <30 mg/g  CBC with Differential/Platelet   Collection Time: 10/09/23  8:41 AM  Result Value Ref Range   WBC 9.5 3.4 - 10.8 x10E3/uL   RBC 5.28 4.14 - 5.80 x10E6/uL   Hemoglobin 16.7 13.0 - 17.7 g/dL   Hematocrit 40.9 (H) 81.1 - 51.0 %   MCV 98 (H) 79 - 97 fL   MCH 31.6 26.6 - 33.0 pg   MCHC 32.4 31.5 -  35.7 g/dL   RDW 91.4 78.2 - 95.6 %   Platelets 175 150 - 450 x10E3/uL   Neutrophils 72 Not Estab. %   Lymphs 18 Not Estab. %   Monocytes 7 Not Estab. %   Eos 2 Not Estab. %   Basos 1 Not Estab. %   Neutrophils Absolute 6.8 1.4 - 7.0 x10E3/uL   Lymphocytes Absolute 1.8 0.7 - 3.1 x10E3/uL   Monocytes Absolute 0.6 0.1 - 0.9 x10E3/uL   EOS (ABSOLUTE) 0.2 0.0 - 0.4 x10E3/uL   Basophils Absolute 0.1 0.0 - 0.2 x10E3/uL   Immature Granulocytes 0  Not Estab. %   Immature Grans (Abs) 0.0 0.0 - 0.1 x10E3/uL  Comprehensive metabolic panel with GFR   Collection Time: 10/09/23  8:41 AM  Result Value Ref Range   Glucose 202 (H) 70 - 99 mg/dL   BUN 6 6 - 24 mg/dL   Creatinine, Ser 0.96 0.76 - 1.27 mg/dL   eGFR 86 >04 VW/UJW/1.19   BUN/Creatinine Ratio 6 (L) 9 - 20   Sodium 138 134 - 144 mmol/L   Potassium 3.7 3.5 - 5.2 mmol/L   Chloride 101 96 - 106 mmol/L   CO2 22 20 - 29 mmol/L   Calcium  9.4 8.7 - 10.2 mg/dL   Total Protein 6.7 6.0 - 8.5 g/dL   Albumin 4.2 4.1 - 5.1 g/dL   Globulin, Total 2.5 1.5 - 4.5 g/dL   Bilirubin Total 0.6 0.0 - 1.2 mg/dL   Alkaline Phosphatase 97 44 - 121 IU/L   AST 18 0 - 40 IU/L   ALT 21 0 - 44 IU/L  Lipid Panel w/o Chol/HDL Ratio   Collection Time: 10/09/23  8:41 AM  Result Value Ref Range   Cholesterol, Total 206 (H) 100 - 199 mg/dL   Triglycerides 147 (H) 0 - 149 mg/dL   HDL 35 (L) >82 mg/dL   VLDL Cholesterol Cal 39 5 - 40 mg/dL   LDL Chol Calc (NIH) 956 (H) 0 - 99 mg/dL  QuantiFERON-TB Gold Plus   Collection Time: 10/09/23  8:41 AM  Result Value Ref Range   QuantiFERON-TB Gold Plus CANCELED       Assessment & Plan:   Problem List Items Addressed This Visit       Endocrine   DM (diabetes mellitus), type 2 with renal complications (HCC)   Running high with A1c of 8.9- will really work on diet and exercise and continue current regimen. Continue to monitor. Recheck in 3 months. Call with any concerns.       Relevant Medications   losartan  (COZAAR) 25 MG tablet   aspirin  EC 81 MG tablet   atorvastatin  (LIPITOR) 40 MG tablet   metFORMIN  (GLUCOPHAGE ) 500 MG tablet   Semaglutide , 2 MG/DOSE, 8 MG/3ML SOPN   Other Relevant Orders   Bayer DCA Hb A1c Waived (Completed)   CBC with Differential/Platelet (Completed)   Comprehensive metabolic panel with GFR (Completed)   Lipid Panel w/o Chol/HDL Ratio (Completed)   Microalbumin, Urine Waived (Completed)     Genitourinary   Benign hypertensive renal disease   Under good control on current regimen. Continue current regimen. Continue to monitor. Call with any concerns. Refills given. Labs drawn today.         Other   Hyperlipidemia   Under good control on current regimen. Continue current regimen. Continue to monitor. Call with any concerns. Refills given. Labs drawn today.       Relevant Medications   losartan (COZAAR) 25 MG tablet   aspirin  EC 81 MG tablet   atorvastatin  (LIPITOR) 40 MG tablet   metoprolol  succinate (TOPROL -XL) 25 MG 24 hr tablet   Other Visit Diagnoses       Upper respiratory tract infection, unspecified type    -  Primary   Will treat with azithromycin . Call if not getting better or getting worse. Continue to monitor.     Long-term use of high-risk medication       Needs quantiferon for his dermatologist- we will draw.   Relevant Orders   QuantiFERON-TB Gold Plus (Completed)  Follow up plan: Return in about 6 weeks (around 11/20/2023).

## 2023-10-10 ENCOUNTER — Encounter: Payer: Self-pay | Admitting: Family Medicine

## 2023-10-10 ENCOUNTER — Telehealth: Payer: Self-pay

## 2023-10-10 NOTE — Telephone Encounter (Signed)
 Copied from CRM [id number]. Topic: Clinical - Prescription Issue >> Oct 09, 2023  4:48 PM Lajean Pike wrote: Reason for CRM: Patient, [phone] called in and stated that he saw his provider today and has not yet received anything from the pharmacy in regards to his medication. He said he doesn't remember the exact name but it is supposed to be for his blood pressure and z-pak.   [facility] #[id number] [place], [place] - [address] AT [facility] OF [address] [place] [address] Phone: [phone] Fax: [phone]

## 2023-10-13 ENCOUNTER — Ambulatory Visit: Payer: Self-pay | Admitting: Family Medicine

## 2023-10-13 DIAGNOSIS — Z79899 Other long term (current) drug therapy: Secondary | ICD-10-CM

## 2023-10-13 LAB — QUANTIFERON-TB GOLD PLUS

## 2023-10-13 MED ORDER — ASPIRIN EC 81 MG PO TBEC: 81.0000 mg | DELAYED_RELEASE_TABLET | Freq: Every day | ORAL | Status: AC

## 2023-10-13 MED ORDER — OMEPRAZOLE 20 MG PO CPDR: 20.0000 mg | DELAYED_RELEASE_CAPSULE | Freq: Every day | ORAL | 1 refills | Status: DC

## 2023-10-13 MED ORDER — AZITHROMYCIN 250 MG PO TABS: ORAL_TABLET | ORAL | 0 refills | Status: AC

## 2023-10-13 MED ORDER — METFORMIN HCL 500 MG PO TABS: 1000.0000 mg | ORAL_TABLET | Freq: Two times a day (BID) | ORAL | 0 refills | Status: DC

## 2023-10-13 MED ORDER — SEMAGLUTIDE (2 MG/DOSE) 8 MG/3ML ~~LOC~~ SOPN: 2.0000 mg | PEN_INJECTOR | SUBCUTANEOUS | 3 refills | Status: DC

## 2023-10-13 MED ORDER — LOSARTAN POTASSIUM 25 MG PO TABS
12.5000 mg | ORAL_TABLET | Freq: Every day | ORAL | 0 refills | Status: DC
Start: 2023-10-13 — End: 2023-11-24

## 2023-10-13 MED ORDER — ATORVASTATIN CALCIUM 40 MG PO TABS: 40.0000 mg | ORAL_TABLET | Freq: Every day | ORAL | 1 refills | Status: DC

## 2023-10-13 MED ORDER — BUDESONIDE-FORMOTEROL FUMARATE 160-4.5 MCG/ACT IN AERO: 2.0000 | INHALATION_SPRAY | Freq: Two times a day (BID) | RESPIRATORY_TRACT | 3 refills | Status: AC

## 2023-10-13 MED ORDER — METOPROLOL SUCCINATE ER 25 MG PO TB24: 25.0000 mg | ORAL_TABLET | Freq: Every day | ORAL | 1 refills | Status: DC

## 2023-10-13 MED ORDER — ALBUTEROL SULFATE HFA 108 (90 BASE) MCG/ACT IN AERS: 2.0000 | INHALATION_SPRAY | Freq: Four times a day (QID) | RESPIRATORY_TRACT | 6 refills | Status: AC | PRN

## 2023-10-14 ENCOUNTER — Ambulatory Visit: Payer: PRIVATE HEALTH INSURANCE | Admitting: Family Medicine

## 2023-10-14 LAB — COMPREHENSIVE METABOLIC PANEL WITH GFR
ALT: 21 IU/L (ref 0–44)
AST: 18 IU/L (ref 0–40)
Albumin: 4.2 g/dL (ref 4.1–5.1)
Alkaline Phosphatase: 97 IU/L (ref 44–121)
BUN/Creatinine Ratio: 6 — ABNORMAL LOW (ref 9–20)
BUN: 6 mg/dL (ref 6–24)
Bilirubin Total: 0.6 mg/dL (ref 0.0–1.2)
CO2: 22 mmol/L (ref 20–29)
Calcium: 9.4 mg/dL (ref 8.7–10.2)
Chloride: 101 mmol/L (ref 96–106)
Creatinine, Ser: 1.07 mg/dL (ref 0.76–1.27)
Globulin, Total: 2.5 g/dL (ref 1.5–4.5)
Glucose: 202 mg/dL — ABNORMAL HIGH (ref 70–99)
Potassium: 3.7 mmol/L (ref 3.5–5.2)
Sodium: 138 mmol/L (ref 134–144)
Total Protein: 6.7 g/dL (ref 6.0–8.5)
eGFR: 86 mL/min/{1.73_m2} (ref >59)

## 2023-10-14 LAB — CBC WITH DIFFERENTIAL/PLATELET
Basophils Absolute: 0.1 10*3/uL (ref 0.0–0.2)
Basos: 1 %
EOS (ABSOLUTE): 0.2 10*3/uL (ref 0.0–0.4)
Eos: 2 %
Hematocrit: 51.5 % — ABNORMAL HIGH (ref 37.5–51.0)
Hemoglobin: 16.7 g/dL (ref 13.0–17.7)
Immature Grans (Abs): 0 10*3/uL (ref 0.0–0.1)
Immature Granulocytes: 0 %
Lymphocytes Absolute: 1.8 10*3/uL (ref 0.7–3.1)
Lymphs: 18 %
MCH: 31.6 pg (ref 26.6–33.0)
MCHC: 32.4 g/dL (ref 31.5–35.7)
MCV: 98 fL — ABNORMAL HIGH (ref 79–97)
Monocytes Absolute: 0.6 10*3/uL (ref 0.1–0.9)
Monocytes: 7 %
Neutrophils Absolute: 6.8 10*3/uL (ref 1.4–7.0)
Neutrophils: 72 %
Platelets: 175 10*3/uL (ref 150–450)
RBC: 5.28 x10E6/uL (ref 4.14–5.80)
RDW: 13.2 % (ref 11.6–15.4)
WBC: 9.5 10*3/uL (ref 3.4–10.8)

## 2023-10-14 LAB — LIPID PANEL W/O CHOL/HDL RATIO
Cholesterol, Total: 206 mg/dL — ABNORMAL HIGH (ref 100–199)
HDL: 35 mg/dL — ABNORMAL LOW (ref >39)
LDL Chol Calc (NIH): 132 mg/dL — ABNORMAL HIGH (ref 0–99)
Triglycerides: 215 mg/dL — ABNORMAL HIGH (ref 0–149)
VLDL Cholesterol Cal: 39 mg/dL (ref 5–40)

## 2023-10-14 LAB — QUANTIFERON-TB GOLD PLUS

## 2023-10-16 ENCOUNTER — Encounter: Payer: Self-pay | Admitting: Family Medicine

## 2023-10-16 NOTE — Assessment & Plan Note (Signed)
 Under good control on current regimen. Continue current regimen. Continue to monitor. Call with any concerns. Refills given. Labs drawn today.

## 2023-10-16 NOTE — Assessment & Plan Note (Signed)
 Running high with A1c of 8.9- will really work on diet and exercise and continue current regimen. Continue to monitor. Recheck in 3 months. Call with any concerns.

## 2023-10-17 NOTE — Telephone Encounter (Signed)
 There has been a message sent to [employer]

## 2023-11-04 ENCOUNTER — Ambulatory Visit: Payer: Self-pay | Admitting: Family Medicine

## 2023-11-04 NOTE — Telephone Encounter (Signed)
 FYI Only or Action Required?: FYI only for provider.  Patient was last seen in primary care on 10/09/2023 by Vicci Bouchard P, DO. Called Nurse Triage reporting Groin Pain. Symptoms began a week ago. Interventions attempted: Nothing. Symptoms are: stable.  Triage Disposition: See Physician Within 24 Hours  Patient/caregiver understands and will follow disposition?: Yes                     Copied from CRM [id number]. Topic: Clinical - Red Word Triage >> Nov 04, 2023  4:36 PM Elle L wrote: Red Word that prompted transfer to Nurse Triage: The patient states he is experiencing pain in his genital area. Reason for Disposition  [1] Pain comes and goes (intermittent) AND [2] present > 24 hours  Answer Assessment - Initial Assessment Questions LOCATION and RADIATION: Where is the pain located?      Inside penis hard part that causes pain x1 week QUALITY: What does the pain feel like?  (e.g., sharp, dull, aching, burning)     Sharp pain when gets an erection  SEVERITY: How bad is the pain?  (Scale 1-10; or mild, moderate, severe)   - MILD (1-3): doesn't interfere with normal activities    - MODERATE (4-7): interferes with normal activities (e.g., work or school) or awakens from sleep   - SEVERE (8-10): excruciating pain, unable to do any normal activities, difficulty walking     6-7/10 pain, never happened before  Denies redness, swelling, pain with urination, fever  Protocols used: Scrotal Pain-A-AH

## 2023-11-05 ENCOUNTER — Encounter: Payer: Self-pay | Admitting: Family Medicine

## 2023-11-05 ENCOUNTER — Ambulatory Visit: Payer: PRIVATE HEALTH INSURANCE | Admitting: Family Medicine

## 2023-11-05 VITALS — BP 125/84 | HR 96 | Ht 68.0 in | Wt 241.2 lb

## 2023-11-05 DIAGNOSIS — Z79899 Other long term (current) drug therapy: Secondary | ICD-10-CM | POA: Diagnosis not present

## 2023-11-05 DIAGNOSIS — R051 Acute cough: Secondary | ICD-10-CM

## 2023-11-05 DIAGNOSIS — N4889 Other specified disorders of penis: Secondary | ICD-10-CM | POA: Diagnosis not present

## 2023-11-05 MED ORDER — TIRZEPATIDE 12.5 MG/0.5ML ~~LOC~~ SOAJ: 12.5000 mg | SUBCUTANEOUS | 1 refills | Status: DC

## 2023-11-05 MED ORDER — SULFAMETHOXAZOLE-TRIMETHOPRIM 800-160 MG PO TABS: 1.0000 | ORAL_TABLET | Freq: Two times a day (BID) | ORAL | 0 refills | Status: DC

## 2023-11-05 NOTE — Addendum Note (Signed)
 Addended by: VICCI DUWAINE SQUIBB on: 11/05/2023 03:27 PM   Modules accepted: Orders

## 2023-11-05 NOTE — Progress Notes (Signed)
 BP 125/84 (BP Location: Left Arm, Patient Position: Sitting, Cuff Size: Large)   Pulse 96   Ht 5' 8 (1.727 m)   Wt 241 lb 3.2 oz (109.4 kg)   SpO2 95%   BMI 36.67 kg/m    Subjective:    Patient ID: Casey Cole, male    DOB: [birth year], 48 y.o.   MRN: [id number]  HPI: Casey Cole is a 48 y.o. male  Chief Complaint  Patient presents with   Groin Pain    Onset about a week ago.    Has had pain in his penis at the top where it meets his abdomen for about a week. It feels like there's a lump under the skin and is more painful when he has an erection. No dysuria. No discharge. No abdominal pain. Pain doesn't move. He hasn't noticed any redness or swelling. No fevers or chills. No other symptoms.   Relevant past medical, surgical, family and social history reviewed and updated as indicated. Interim medical history since our last visit reviewed. Allergies and medications reviewed and updated.  Review of Systems  Constitutional: Negative.   Respiratory:  Positive for cough. Negative for apnea, choking, chest tightness, shortness of breath, wheezing and stridor.   Cardiovascular: Negative.   Genitourinary: Negative.   Musculoskeletal: Negative.   Neurological: Negative.   Psychiatric/Behavioral: Negative.      Per HPI unless specifically indicated above     Objective:    BP 125/84 (BP Location: Left Arm, Patient Position: Sitting, Cuff Size: Large)   Pulse 96   Ht 5' 8 (1.727 m)   Wt 241 lb 3.2 oz (109.4 kg)   SpO2 95%   BMI 36.67 kg/m   Wt Readings from Last 3 Encounters:  11/05/23 241 lb 3.2 oz (109.4 kg)  10/09/23 240 lb 9.6 oz (109.1 kg)  05/16/23 240 lb (108.9 kg)    Physical Exam Vitals and nursing note reviewed.  Constitutional:      General: He is not in acute distress.    Appearance: Normal appearance. He is not ill-appearing, toxic-appearing or diaphoretic.  HENT:     Head: Normocephalic and atraumatic.     Right Ear: External ear normal.     Left  Ear: External ear normal.     Nose: Nose normal.     Mouth/Throat:     Mouth: Mucous membranes are moist.     Pharynx: Oropharynx is clear.  Eyes:     General: No scleral icterus.       Right eye: No discharge.        Left eye: No discharge.     Extraocular Movements: Extraocular movements intact.     Conjunctiva/sclera: Conjunctivae normal.     Pupils: Pupils are equal, round, and reactive to light.  Cardiovascular:     Rate and Rhythm: Normal rate and regular rhythm.     Pulses: Normal pulses.     Heart sounds: Normal heart sounds. No murmur heard.    No friction rub. No gallop.  Pulmonary:     Effort: Pulmonary effort is normal. No respiratory distress.     Breath sounds: Normal breath sounds. No stridor. No wheezing, rhonchi or rales.  Chest:     Chest wall: No tenderness.  Genitourinary:    Penis: Circumcised.      Testes: Normal. Cremasteric reflex is present.    Musculoskeletal:        General: Normal range of motion.     Cervical back: Normal range of  motion and neck supple.  Skin:    General: Skin is warm and dry.     Capillary Refill: Capillary refill takes less than 2 seconds.     Coloration: Skin is not jaundiced or pale.     Findings: No bruising, erythema, lesion or rash.  Neurological:     General: No focal deficit present.     Mental Status: He is alert and oriented to person, place, and time. Mental status is at baseline.  Psychiatric:        Mood and Affect: Mood normal.        Behavior: Behavior normal.        Thought Content: Thought content normal.        Judgment: Judgment normal.     Results for orders placed or performed in visit on 10/09/23  Bayer DCA Hb A1c Waived   Collection Time: 10/09/23  8:38 AM  Result Value Ref Range   HB A1C (BAYER DCA - WAIVED) 8.9 (H) 4.8 - 5.6 %  Microalbumin, Urine Waived   Collection Time: 10/09/23  8:38 AM  Result Value Ref Range   Microalb, Ur Waived 30 (H) 0 - 19 mg/L   Creatinine, Urine Waived 300 10 -  300 mg/dL   Microalb/Creat Ratio <30 <30 mg/g  CBC with Differential/Platelet   Collection Time: 10/09/23  8:41 AM  Result Value Ref Range   WBC 9.5 3.4 - 10.8 x10E3/uL   RBC 5.28 4.14 - 5.80 x10E6/uL   Hemoglobin 16.7 13.0 - 17.7 g/dL   Hematocrit 48.4 (H) 62.4 - 51.0 %   MCV 98 (H) 79 - 97 fL   MCH 31.6 26.6 - 33.0 pg   MCHC 32.4 31.5 - 35.7 g/dL   RDW 86.7 88.3 - 84.5 %   Platelets 175 150 - 450 x10E3/uL   Neutrophils 72 Not Estab. %   Lymphs 18 Not Estab. %   Monocytes 7 Not Estab. %   Eos 2 Not Estab. %   Basos 1 Not Estab. %   Neutrophils Absolute 6.8 1.4 - 7.0 x10E3/uL   Lymphocytes Absolute 1.8 0.7 - 3.1 x10E3/uL   Monocytes Absolute 0.6 0.1 - 0.9 x10E3/uL   EOS (ABSOLUTE) 0.2 0.0 - 0.4 x10E3/uL   Basophils Absolute 0.1 0.0 - 0.2 x10E3/uL   Immature Granulocytes 0 Not Estab. %   Immature Grans (Abs) 0.0 0.0 - 0.1 x10E3/uL  Comprehensive metabolic panel with GFR   Collection Time: 10/09/23  8:41 AM  Result Value Ref Range   Glucose 202 (H) 70 - 99 mg/dL   BUN 6 6 - 24 mg/dL   Creatinine, Ser 8.92 0.76 - 1.27 mg/dL   eGFR 86 >40 fO/fpw/8.26   BUN/Creatinine Ratio 6 (L) 9 - 20   Sodium 138 134 - 144 mmol/L   Potassium 3.7 3.5 - 5.2 mmol/L   Chloride 101 96 - 106 mmol/L   CO2 22 20 - 29 mmol/L   Calcium  9.4 8.7 - 10.2 mg/dL   Total Protein 6.7 6.0 - 8.5 g/dL   Albumin 4.2 4.1 - 5.1 g/dL   Globulin, Total 2.5 1.5 - 4.5 g/dL   Bilirubin Total 0.6 0.0 - 1.2 mg/dL   Alkaline Phosphatase 97 44 - 121 IU/L   AST 18 0 - 40 IU/L   ALT 21 0 - 44 IU/L  Lipid Panel w/o Chol/HDL Ratio   Collection Time: 10/09/23  8:41 AM  Result Value Ref Range   Cholesterol, Total 206 (H) 100 - 199 mg/dL  Triglycerides 215 (H) 0 - 149 mg/dL   HDL 35 (L) >60 mg/dL   VLDL Cholesterol Cal 39 5 - 40 mg/dL   LDL Chol Calc (NIH) 867 (H) 0 - 99 mg/dL  QuantiFERON-TB Gold Plus   Collection Time: 10/09/23  8:41 AM  Result Value Ref Range   QuantiFERON-TB Gold Plus CANCELED       Assessment  & Plan:   Problem List Items Addressed This Visit   None Visit Diagnoses       Penile pain    -  Primary   Normal exam. Uncontrolled diabetic. Will treat with bactrim  and get him into urology. Call with any concerns.   Relevant Orders   Ambulatory referral to Urology     Long-term use of high-risk medication       Quantiferon drawn today.     Acute cough       Lungs clear- will send for CXR. Await results.   Relevant Orders   DG Chest 2 View        Follow up plan: Return for As scheduled.

## 2023-11-06 ENCOUNTER — Other Ambulatory Visit: Payer: PRIVATE HEALTH INSURANCE

## 2023-11-06 ENCOUNTER — Ambulatory Visit: Payer: Self-pay | Admitting: Family Medicine

## 2023-11-06 ENCOUNTER — Ambulatory Visit
Admission: RE | Admit: 2023-11-06 | Discharge: 2023-11-06 | Disposition: A | Discharge status: Home / Self Care | Payer: PRIVATE HEALTH INSURANCE | Source: Ambulatory Visit | Attending: Family Medicine | Admitting: Family Medicine

## 2023-11-06 DIAGNOSIS — R051 Acute cough: Secondary | ICD-10-CM | POA: Insufficient documentation

## 2023-11-06 MED ORDER — AZITHROMYCIN 250 MG PO TABS: ORAL_TABLET | ORAL | 0 refills | Status: AC

## 2023-11-10 ENCOUNTER — Ambulatory Visit: Payer: Self-pay | Admitting: Nurse Practitioner

## 2023-11-10 LAB — QUANTIFERON-TB GOLD PLUS
QuantiFERON Mitogen Value: >10 [IU]/mL
QuantiFERON Nil Value: 0.14 [IU]/mL
QuantiFERON TB1 Ag Value: 0.1 [IU]/mL
QuantiFERON TB2 Ag Value: 0.09 [IU]/mL

## 2023-11-24 ENCOUNTER — Ambulatory Visit: Payer: PRIVATE HEALTH INSURANCE | Admitting: Family Medicine

## 2023-11-24 ENCOUNTER — Telehealth: Payer: Self-pay

## 2023-11-24 ENCOUNTER — Encounter: Payer: Self-pay | Admitting: Family Medicine

## 2023-11-24 VITALS — BP 108/75 | HR 93 | Temp 97.6°F | Ht 68.0 in | Wt 239.4 lb

## 2023-11-24 DIAGNOSIS — I129 Hypertensive chronic kidney disease with stage 1 through stage 4 chronic kidney disease, or unspecified chronic kidney disease: Secondary | ICD-10-CM | POA: Diagnosis not present

## 2023-11-24 DIAGNOSIS — R809 Proteinuria, unspecified: Secondary | ICD-10-CM | POA: Diagnosis not present

## 2023-11-24 DIAGNOSIS — E1129 Type 2 diabetes mellitus with other diabetic kidney complication: Secondary | ICD-10-CM

## 2023-11-24 DIAGNOSIS — Z1211 Encounter for screening for malignant neoplasm of colon: Secondary | ICD-10-CM

## 2023-11-24 NOTE — Assessment & Plan Note (Signed)
 Running low on losartan - stop. Call with any concerns.

## 2023-11-24 NOTE — Progress Notes (Signed)
 Care Guide Pharmacy Note  11/24/2023 Name: Casey Cole MRN: [id number] DOB: Nov 01, 1975  Referred By: Vicci Duwaine SQUIBB, DO Reason for referral: Complex Care Management (Outreach to schedule with Pharm d )   Casey Cole is a 48 y.o. year old male who is a primary care patient of Vicci Duwaine SQUIBB, DO.  Casey Cole was referred to the pharmacist for assistance related to: DMII  Successful contact was made with the patient to discuss pharmacy services including being ready for the pharmacist to call at least 5 minutes before the scheduled appointment time and to have medication bottles and any blood pressure readings ready for review. The patient agreed to meet with the pharmacist via telephone visit on (date/time).12/02/2023  Casey Cole , RMA     [place]  [facility] Guide  Direct Dial: [phone]  Website: [email or web address]

## 2023-11-24 NOTE — Assessment & Plan Note (Signed)
 Was unable to get mounjaro  due to insurance. Will get pharmacy involved to see if they can help. A1c 8.9- uncontrolled. If unable to get on mounjaro  will continue ozempic  2mg  and add in jardiance or farxiga depending on coverage.

## 2023-11-24 NOTE — Progress Notes (Signed)
 BP 108/75   Pulse 93   Temp 97.6 F (36.4 C) (Oral)   Ht 5' 8 (1.727 m)   Wt 239 lb 6.4 oz (108.6 kg)   SpO2 97%   BMI 36.40 kg/m    Subjective:    Patient ID: Casey Cole, male    DOB: January 14, 1976, 48 y.o.   MRN: [id number]  HPI: Casey Cole is a 48 y.o. male  Chief Complaint  Patient presents with   Diabetes   DIABETES Hypoglycemic episodes:no Polydipsia/polyuria: no Visual disturbance: no Chest pain: no Paresthesias: no Glucose Monitoring: yes Taking Insulin?: no Blood Pressure Monitoring: a few times a week Retinal Examination: Up to Date Foot Exam: Up to Date Diabetic Education: Completed Pneumovax: Not up to Date Influenza: Up to Date Aspirin : yes  HYPERTENSION- BP was running really low so stopped his losartan . Feeling well  Hypertension status: controlled  Satisfied with current treatment? yes Duration of hypertension: chronic BP monitoring frequency:  a few times a week BP range: 100s/70s BP medication side effects:  no Medication compliance: excellent compliance Previous BP meds:metoprolol , losartan  Aspirin : yes Recurrent headaches: no Visual changes: no Palpitations: no Dyspnea: no Chest pain: no Lower extremity edema: no Dizzy/lightheaded: no   Relevant past medical, surgical, family and social history reviewed and updated as indicated. Interim medical history since our last visit reviewed. Allergies and medications reviewed and updated.  Review of Systems  Constitutional: Negative.   Respiratory: Negative.    Cardiovascular: Negative.   Musculoskeletal: Negative.   Neurological: Negative.   Psychiatric/Behavioral: Negative.      Per HPI unless specifically indicated above     Objective:    BP 108/75   Pulse 93   Temp 97.6 F (36.4 C) (Oral)   Ht 5' 8 (1.727 m)   Wt 239 lb 6.4 oz (108.6 kg)   SpO2 97%   BMI 36.40 kg/m   Wt Readings from Last 3 Encounters:  11/24/23 239 lb 6.4 oz (108.6 kg)  11/05/23 241 lb 3.2 oz  (109.4 kg)  10/09/23 240 lb 9.6 oz (109.1 kg)    Physical Exam Vitals and nursing note reviewed.  Constitutional:      General: He is not in acute distress.    Appearance: Normal appearance. He is not ill-appearing, toxic-appearing or diaphoretic.  HENT:     Head: Normocephalic and atraumatic.     Right Ear: External ear normal.     Left Ear: External ear normal.     Nose: Nose normal.     Mouth/Throat:     Mouth: Mucous membranes are moist.     Pharynx: Oropharynx is clear.  Eyes:     General: No scleral icterus.       Right eye: No discharge.        Left eye: No discharge.     Extraocular Movements: Extraocular movements intact.     Conjunctiva/sclera: Conjunctivae normal.     Pupils: Pupils are equal, round, and reactive to light.  Cardiovascular:     Rate and Rhythm: Normal rate and regular rhythm.     Pulses: Normal pulses.     Heart sounds: Normal heart sounds. No murmur heard.    No friction rub. No gallop.  Pulmonary:     Effort: Pulmonary effort is normal. No respiratory distress.     Breath sounds: Normal breath sounds. No stridor. No wheezing, rhonchi or rales.  Chest:     Chest wall: No tenderness.  Musculoskeletal:  General: Normal range of motion.     Cervical back: Normal range of motion and neck supple.  Skin:    General: Skin is warm and dry.     Capillary Refill: Capillary refill takes less than 2 seconds.     Coloration: Skin is not jaundiced or pale.     Findings: No bruising, erythema, lesion or rash.  Neurological:     General: No focal deficit present.     Mental Status: He is alert and oriented to person, place, and time. Mental status is at baseline.  Psychiatric:        Mood and Affect: Mood normal.        Behavior: Behavior normal.        Thought Content: Thought content normal.        Judgment: Judgment normal.     Results for orders placed or performed in visit on 11/05/23  QuantiFERON-TB Gold Plus   Collection Time: 11/05/23   3:08 PM  Result Value Ref Range   QuantiFERON Incubation Incubation performed.    QuantiFERON Criteria Comment    QuantiFERON TB1 Ag Value 0.10 IU/mL   QuantiFERON TB2 Ag Value 0.09 IU/mL   QuantiFERON Nil Value 0.14 IU/mL   QuantiFERON Mitogen Value >10.00 IU/mL   QuantiFERON-TB Gold Plus Negative Negative      Assessment & Plan:   Problem List Items Addressed This Visit       Endocrine   DM (diabetes mellitus), type 2 with renal complications (HCC)   Was unable to get mounjaro  due to insurance. Will get pharmacy involved to see if they can help. A1c 8.9- uncontrolled. If unable to get on mounjaro  will continue ozempic  2mg  and add in jardiance or farxiga depending on coverage.       Relevant Medications   OZEMPIC , 2 MG/DOSE, 8 MG/3ML SOPN   Other Relevant Orders   AMB Referral VBCI Care Management     Genitourinary   Benign hypertensive renal disease - Primary   Running low on losartan - stop. Call with any concerns.       Other Visit Diagnoses       Screening for colon cancer       Referral to GI placed   Relevant Orders   Ambulatory referral to Gastroenterology        Follow up plan: Return in about 6 weeks (around 01/05/2024).

## 2023-11-28 ENCOUNTER — Other Ambulatory Visit (HOSPITAL_COMMUNITY): Payer: Self-pay

## 2023-11-28 ENCOUNTER — Telehealth: Payer: Self-pay | Admitting: Pharmacy Technician

## 2023-11-28 NOTE — Telephone Encounter (Signed)
 Pharmacy Patient Advocate Encounter   Received notification from Onbase that prior authorization for Mounjaro  12.5mg /0.44ml auto-injectors is required/requested.   Insurance verification completed.   The patient is insured through [employer] .   Per test claim: PA required; PA submitted to above mentioned insurance via LATENT Key/confirmation #/EOC [id number] Status is pending

## 2023-12-01 ENCOUNTER — Other Ambulatory Visit (HOSPITAL_COMMUNITY): Payer: Self-pay

## 2023-12-02 ENCOUNTER — Ambulatory Visit: Payer: PRIVATE HEALTH INSURANCE | Admitting: Urology

## 2023-12-02 ENCOUNTER — Other Ambulatory Visit: Payer: PRIVATE HEALTH INSURANCE

## 2023-12-02 VITALS — BP 127/82 | HR 105 | Ht 68.0 in | Wt 241.8 lb

## 2023-12-02 DIAGNOSIS — N486 Induration penis plastica: Secondary | ICD-10-CM | POA: Diagnosis not present

## 2023-12-02 NOTE — Patient Instructions (Signed)
 Peyronies disease is noncancerous scar tissue of the penis that can cause pain with erections and curvature's.  There are 2 phases, the active and stable phase.  During the active phase patient still have pain with erections and change in the curvature, and the stable phase no longer have pain and the curve is stable.  Unfortunately, we do not have any treatments until we get to the stable phase.  You can use anti-inflammatories like Aleve  or ibuprofen for any pain with erections during the active phase.  Visit [email or web address] and click peyronies disease to learn more

## 2023-12-02 NOTE — Progress Notes (Signed)
   12/02/23 4:26 PM   Casey Cole [birth year] [id number]  CC: Penile pain  HPI: 48 year old male who reports a few weeks of penile pain with erections and sexual activity.  He may have some mild upward curvature.  This does not prevent sexual activity.  He denies any problems with erections.   PMH: Past Medical History:  Diagnosis Date   Abscess of superficial perineal space 08/23/2015   Depression 10/27/2015   Diabetes mellitus without complication (HCC)    Hyperlipidemia    Hypertension    Recurrent major depressive disorder, in full remission (HCC) 10/26/2018   Sleep apnea     Surgical History: Past Surgical History:  Procedure Laterality Date   CHOLECYSTECTOMY     COLONOSCOPY WITH PROPOFOL  N/A 01/17/2022   Procedure: COLONOSCOPY WITH PROPOFOL ;  Surgeon: Therisa Bi, MD;  Location: [facility] ENDOSCOPY;  Service: Gastroenterology;  Laterality: N/A;   CORONARY PRESSURE/FFR WITH 3D MAPPING N/A 07/17/2021   Procedure: Coronary Pressure Wire/FFR w/3D Mapping;  Surgeon: Mady Bruckner, MD;  Location: [facility] INVASIVE CV LAB;  Service: Cardiovascular;  Laterality: N/A;   INCISE AND DRAIN ABCESS Left 07-12-15   left buttock   INCISION AND DRAINAGE PERIRECTAL ABSCESS N/A 08/23/2015   Procedure: IRRIGATION AND DEBRIDEMENT PERIRECTAL ABSCESS;  Surgeon: Reyes LELON Cota, MD;  Location: [facility] ORS;  Service: General;  Laterality: N/A;  Anal Scope   LEFT HEART CATH AND CORONARY ANGIOGRAPHY Left 07/17/2021   Procedure: LEFT HEART CATH AND CORONARY ANGIOGRAPHY;  Surgeon: Mady Bruckner, MD;  Location: [facility] INVASIVE CV LAB;  Service: Cardiovascular;  Laterality: Left;   VASECTOMY      Family History: Family History  Problem Relation Age of Onset   Hypertension Father    Stroke Paternal Grandfather    Heart attack Paternal Grandfather     Social History:  reports that he quit smoking about 2 years ago. His smoking use included cigarettes. He has never used smokeless tobacco. He reports that he  does not currently use alcohol. He reports that he does not use drugs.  Physical Exam: BP 127/82 (BP Location: Left Arm, Patient Position: Sitting, Cuff Size: Large)   Pulse (!) 105   Ht 5' 8 (1.727 m)   Wt 241 lb 12.8 oz (109.7 kg)   SpO2 98%   BMI 36.77 kg/m    Constitutional:  Alert and oriented, No acute distress. Cardiovascular: No clubbing, cyanosis, or edema. Respiratory: Normal respiratory effort, no increased work of breathing. GI: Abdomen is soft, nontender, nondistended, no abdominal masses GU: Easily palpable 2 cm plaque at the dorsal base of the penis, mildly tender  Assessment & Plan:   48 year old male with new diagnosis of peyronies disease.  We discussed the difference between active and stable phase of the disease, and that NSAIDs are recommended for penile pain during the active phase.  We discussed treatment options and the stable phase when curvature is stable and no longer having pain during erections including Xiaflex, penile plication, penile prosthesis, or surveillance.  NSAIDs for penile pain during active phase of peyronies RTC 4 months symptom check    Redell Burnet, MD 12/02/2023  [facility] [address], [address] [place], [place] [address] [phone]

## 2023-12-02 NOTE — Addendum Note (Signed)
 Addended by: RUDY CRAVEN T on: 12/02/2023 11:05 AM   Modules accepted: Level of Service

## 2023-12-02 NOTE — Progress Notes (Signed)
 12/02/2023 Name: Casey Cole MRN: [id number] DOB: 1975/05/30  Chief Complaint  Patient presents with   Diabetes Management Plan   Casey Cole is a 48 y.o. year old male who presented for a telephone visit.   They were referred to the pharmacist by their PCP for assistance in managing diabetes.   Subjective:  Care Team: Primary Care Provider: Vicci Duwaine SQUIBB, DO ; Next Scheduled Visit: 01/13/2024  Medication Access/Adherence  Current Pharmacy:  [id number] - [place], [place] - [address] AT [facility] OF [address] [place] [address] Phone: [phone] Fax: [phone]  -Patient reports affordability concerns with their medications: Yes  -Patient reports access/transportation concerns to their pharmacy: No  -Patient reports adherence concerns with their medications:  No    Diabetes: Current medications: Ozempic  2mg  weekly, Metformin  1000mg  BID -Patient has not tried any other GLP1 medications in the past, but Dr. Vicci would like to change Ozempic  2mg  to Mounjaro  12.5mg  for additional A1c reduction -Mounjaro  requires a prior authorization on patient's plan, so he has not yet started this medication and continues to use Ozempic  2mg  -Does not monitor BG regularly but will on occasion; states readings typically are 240-250 (not always FBG readings) -A1c recently elevated at 8.9% 10/09/2023  Objective: Lab Results  Component Value Date   HGBA1C 8.9 (H) 10/09/2023   Lab Results  Component Value Date   CREATININE 1.07 10/09/2023   BUN 6 10/09/2023   NA 138 10/09/2023   K 3.7 10/09/2023   CL 101 10/09/2023   CO2 22 10/09/2023   Medications Reviewed Today     Reviewed by Deanna Channing LABOR, RPH (Pharmacist) on 12/02/23 at [phone]  Med List Status: <None>   Medication Order Taking? Sig Documenting Provider Last Dose Status Informant  albuterol  (VENTOLIN  HFA) 108 (90 Base) MCG/ACT inhaler [id number]  Inhale 2 puffs into the lungs every 6  (six) hours as needed for wheezing or shortness of breath. Vicci Duwaine P, DO  Active   aspirin  EC 81 MG tablet [id number]  Take 1 tablet (81 mg total) by mouth daily. Swallow whole. Johnson, Megan P, DO  Active   atorvastatin  (LIPITOR) 40 MG tablet [id number]  Take 1 tablet (40 mg total) by mouth daily. Johnson, Megan P, DO  Active   budesonide -formoterol  (SYMBICORT ) 160-4.5 MCG/ACT inhaler [id number]  Inhale 2 puffs into the lungs 2 (two) times daily. Vicci Duwaine P, DO  Active   glucose blood test strip [id number] Yes Use as instructed Vicci Duwaine SQUIBB, DO  Active   guselkumab (TREMFYA PEN) 100 MG/ML pen [id number]   [provider]  Active   Guselkumab [facility]) [id number]  Inject 1 Dose into the skin as directed. Every 8 weeks after starter doses. [provider]  Active   metFORMIN  (GLUCOPHAGE ) 500 MG tablet [id number] Yes Take 2 tablets (1,000 mg total) by mouth 2 (two) times daily with a meal. Vicci, Megan P, DO  Active   metoprolol  succinate (TOPROL -XL) 25 MG 24 hr tablet [id number]  Take 1 tablet (25 mg total) by mouth daily. Johnson, Megan P, DO  Active   omeprazole  (PRILOSEC) 20 MG capsule [id number]  Take 1 capsule (20 mg total) by mouth daily. Vicci Duwaine P, DO  Active   OZEMPIC , 2 MG/DOSE, 8 MG/3ML SOPN [id number] Yes SMARTSIG:2 Milligram(s) Once a Week [provider]  Active   tirzepatide  (MOUNJARO ) 12.5 MG/0.5ML Pen [id number]  Inject 12.5 mg into the skin once  a week.  Patient not taking: Reported on 12/02/2023   Vicci Duwaine SQUIBB, DO  Active            Assessment/Plan:   Diabetes: -Currently uncontrolled -Prior authorization submitted to insurance via CoverMyMeds for Mounjaro  12.5mg  weekly.  Request had to be faxed, so I will likely have to call plan for an update on status in 24-48 hours.   Follow Up Plan: Will monitor progress of PA and inform patient/provider  Channing DELENA Mealing, PharmD, DPLA

## 2023-12-03 ENCOUNTER — Telehealth: Payer: Self-pay

## 2023-12-03 NOTE — Progress Notes (Signed)
   12/03/2023  Patient ID: Casey Cole, male   DOB: 1975-12-25, 48 y.o.   MRN: [id number]  Contacted patient's insurance to follow-up on status of PA request sent for Mounjaro , and they representative states this is currently pending review and decisions usually take 7-14 days.  I will contact them again next week if we have not received any communication.  Casey Cole, PharmD, DPLA

## 2023-12-04 ENCOUNTER — Other Ambulatory Visit (HOSPITAL_COMMUNITY): Payer: Self-pay

## 2023-12-04 NOTE — Telephone Encounter (Signed)
 Pharmacy Patient Advocate Encounter  Received notification from [employer] that Prior Authorization for Mounjaro  12.5MG /0.5ML auto-injectors  has been APPROVED from 12/03/23 to 12/02/24. Ran test claim, Copay is $25. This test claim was processed through [facility]- copay amounts may vary at other pharmacies due to pharmacy/plan contracts, or as the patient moves through the different stages of their insurance plan.   PA #/Case ID/Reference #: [id number]

## 2023-12-05 ENCOUNTER — Telehealth: Payer: Self-pay

## 2023-12-05 NOTE — Progress Notes (Signed)
   12/05/2023  Patient ID: Debby Manas, male   DOB: 1976-01-18, 48 y.o.   MRN: [id number]  Prior authorization for Mounjaro  12.5mg  has been approved; contacted [facility], and they are only able to fill a 1 month supply at a time.  Medication is going through on insurance for $35/month and will be ready for pick up Monday.  I was also able to obtain a manufacturer copay card that will take patient's copay to $25.  Sending MyChart message to make patient aware, provide copay card processing information, and schedule 4 week telephone follow-up visit.  Mounjaro  Copay Card RXBIN: [id number] PCN: [id number] GRP: [id number] ID: [id number] Expiration Date: 05/05/2024  Channing DELENA Mealing, PharmD, DPLA

## 2023-12-18 ENCOUNTER — Ambulatory Visit: Payer: PRIVATE HEALTH INSURANCE | Attending: Cardiology | Admitting: Cardiology

## 2023-12-18 VITALS — BP 140/100 | HR 97 | Ht 68.0 in | Wt 238.2 lb

## 2023-12-18 DIAGNOSIS — E782 Mixed hyperlipidemia: Secondary | ICD-10-CM

## 2023-12-18 DIAGNOSIS — I2510 Atherosclerotic heart disease of native coronary artery without angina pectoris: Secondary | ICD-10-CM | POA: Diagnosis not present

## 2023-12-18 DIAGNOSIS — I10 Essential (primary) hypertension: Secondary | ICD-10-CM

## 2023-12-18 MED ORDER — ATORVASTATIN CALCIUM 80 MG PO TABS: 40.0000 mg | ORAL_TABLET | Freq: Every day | ORAL | 3 refills | Status: DC

## 2023-12-18 NOTE — Patient Instructions (Signed)
 Medication Instructions:  - INCREASE lipitor to 80 mg daily   *If you need a refill on your cardiac medications before your next appointment, please call your pharmacy*  Lab Work: Your provider would like for you to return in 6 months to have the following labs drawn: fasting lipid panel.   Please go to [facility] [address] [address], [place] [address] You do not need an appointment.  They are open from 8 am- 4:30 pm.  Lunch from 1:00 pm- 2:00 pm You DO need to be fasting.  If you have labs (blood work) drawn today and your tests are completely normal, you will receive your results only by: MyChart Message (if you have MyChart) OR A paper copy in the mail If you have any lab test that is abnormal or we need to change your treatment, we will call you to review the results.  Testing/Procedures: No test ordered today   Follow-Up: At [facility], you and your health needs are our priority.  As part of our continuing mission to provide you with exceptional heart care, our providers are all part of one team.  This team includes your primary Cardiologist (physician) and Advanced Practice Providers or APPs (Physician Assistants and Nurse Practitioners) who all work together to provide you with the care you need, when you need it.  Your next appointment:   1 year(s)  Provider:   You may see Redell Cave, MD or one of the following Advanced Practice Providers on your designated Care Team:   Lonni Meager, NP Lesley Maffucci, PA-C Bernardino Bring, PA-C Cadence Homestead, PA-C Tylene Lunch, NP Barnie Hila, NP    We recommend signing up for the patient portal called MyChart.  Sign up information is provided on this After Visit Summary.  MyChart is used to connect with patients for Virtual Visits (Telemedicine).  Patients are able to view lab/test results, encounter notes, upcoming appointments, etc.  Non-urgent messages can be sent to  your provider as well.   To learn more about what you can do with MyChart, go to [email or web address].

## 2023-12-18 NOTE — Progress Notes (Signed)
 Cardiology Office Note:    Date:  12/18/2023   ID:  Casey Cole, DOB [birth year], MRN [id number]  PCP:  Vicci Duwaine SQUIBB, DO   [facility] Providers Cardiologist:  Redell Cave, MD     Referring MD: Vicci Duwaine SQUIBB, DO   Chief Complaint  Patient presents with   12 month follow up    No issues since last visit. Doing well.     History of Present Illness:    Casey Cole is a 48 y.o. male with a hx of CAD (LHC 07/2021 70 to 90% D1-too small for intervention, moderate LAD, OM 2, PDA disease), hyperlipidemia, diabetes, former smoker x20+ years, OSA who presents for follow-up.   Doing okay, denies chest pain or shortness of breath.  Compliant with aspirin , Lipitor as prescribed.  Has not taken Toprol -XL over the past 2 days.  BP slightly elevated this morning.  Overall doing okay with no new concerns at this time.   Prior notes LHC 07/2021 70 to 90% D1-2 small for intervention, moderate LAD, OM 2, PDA disease Echo 07/23/2021 EF 60 to 65%, impaired relaxation Coronary CTA 07/12/2021 calcium  score 116, severe stenosis in PDA, severe stenosis OM 2, moderate LAD disease.   Past Medical History:  Diagnosis Date   Abscess of superficial perineal space 08/23/2015   Depression 10/27/2015   Diabetes mellitus without complication (HCC)    Hyperlipidemia    Hypertension    Recurrent major depressive disorder, in full remission (HCC) 10/26/2018   Sleep apnea     Past Surgical History:  Procedure Laterality Date   CHOLECYSTECTOMY     COLONOSCOPY WITH PROPOFOL  N/A 01/17/2022   Procedure: COLONOSCOPY WITH PROPOFOL ;  Surgeon: Therisa Bi, MD;  Location: [facility] ENDOSCOPY;  Service: Gastroenterology;  Laterality: N/A;   CORONARY PRESSURE/FFR WITH 3D MAPPING N/A 07/17/2021   Procedure: Coronary Pressure Wire/FFR w/3D Mapping;  Surgeon: Mady Bruckner, MD;  Location: [facility] INVASIVE CV LAB;  Service: Cardiovascular;  Laterality: N/A;   INCISE AND DRAIN ABCESS Left 07-12-15   left buttock    INCISION AND DRAINAGE PERIRECTAL ABSCESS N/A 08/23/2015   Procedure: IRRIGATION AND DEBRIDEMENT PERIRECTAL ABSCESS;  Surgeon: Reyes LELON Cota, MD;  Location: [facility] ORS;  Service: General;  Laterality: N/A;  Anal Scope   LEFT HEART CATH AND CORONARY ANGIOGRAPHY Left 07/17/2021   Procedure: LEFT HEART CATH AND CORONARY ANGIOGRAPHY;  Surgeon: Mady Bruckner, MD;  Location: [facility] INVASIVE CV LAB;  Service: Cardiovascular;  Laterality: Left;   VASECTOMY      Current Medications: Current Meds  Medication Sig   albuterol  (VENTOLIN  HFA) 108 (90 Base) MCG/ACT inhaler Inhale 2 puffs into the lungs every 6 (six) hours as needed for wheezing or shortness of breath.   aspirin  EC 81 MG tablet Take 1 tablet (81 mg total) by mouth daily. Swallow whole.   budesonide -formoterol  (SYMBICORT ) 160-4.5 MCG/ACT inhaler Inhale 2 puffs into the lungs 2 (two) times daily.   glucose blood test strip Use as instructed   guselkumab (TREMFYA PEN) 100 MG/ML pen    Guselkumab (TREMFYA [place]) Inject 1 Dose into the skin as directed. Every 8 weeks after starter doses.   metFORMIN  (GLUCOPHAGE ) 500 MG tablet Take 2 tablets (1,000 mg total) by mouth 2 (two) times daily with a meal.   metoprolol  succinate (TOPROL -XL) 25 MG 24 hr tablet Take 1 tablet (25 mg total) by mouth daily.   omeprazole  (PRILOSEC) 20 MG capsule Take 1 capsule (20 mg total) by mouth daily.   tirzepatide  (MOUNJARO ) 12.5 MG/0.5ML  Pen Inject 12.5 mg into the skin once a week.   [DISCONTINUED] atorvastatin  (LIPITOR) 40 MG tablet Take 1 tablet (40 mg total) by mouth daily.     Allergies:   Methylprednisolone and Prednisone    Social History   Socioeconomic History   Marital status: Married    Spouse name: Not on file   Number of children: Not on file   Years of education: Not on file   Highest education level: Associate degree: academic program  Occupational History   Not on file  Tobacco Use   Smoking status: Former    Current packs/day: 0.00    Types:  Cigarettes    Quit date: 07/14/2021    Years since quitting: 2.4   Smokeless tobacco: Never  Vaping Use   Vaping status: Never Used  Substance and Sexual Activity   Alcohol use: Not Currently   Drug use: No   Sexual activity: Yes  Other Topics Concern   Not on file  Social History Narrative   Caffeine use: 2-3 per day (soda)   Social Drivers of Health   Financial Resource Strain: High Risk (10/14/2022)   Overall Financial Resource Strain (CARDIA)    Difficulty of Paying Living Expenses: Hard  Food Insecurity: No Food Insecurity (10/14/2022)   Hunger Vital Sign    Worried About Running Out of Food in the Last Year: Never true    Ran Out of Food in the Last Year: Never true  Transportation Needs: No Transportation Needs (10/14/2022)   PRAPARE - Administrator, Civil Service (Medical): No    Lack of Transportation (Non-Medical): No  Physical Activity: Insufficiently Active (10/14/2022)   Exercise Vital Sign    Days of Exercise per Week: 3 days    Minutes of Exercise per Session: 30 min  Stress: Stress Concern Present (10/14/2022)   [employer] of Occupational Health - Occupational Stress Questionnaire    Feeling of Stress : Rather much  Social Connections: Unknown (10/14/2022)   Social Connection and Isolation Panel    Frequency of Communication with Friends and Family: Patient declined    Frequency of Social Gatherings with Friends and Family: Patient declined    Attends Religious Services: Patient declined    Database administrator or Organizations: Patient declined    Attends Engineer, structural: Not on file    Marital Status: Married     Family History: The patient's family history includes Heart attack in his paternal grandfather; Hypertension in his father; Stroke in his paternal grandfather.  ROS:   Please see the history of present illness.     All other systems reviewed and are negative.  EKGs/Labs/Other Studies Reviewed:    The  following studies were reviewed today:   EKG Interpretation Date/Time:  Thursday December 18 2023 08:04:10 EDT Ventricular Rate:  93 PR Interval:  164 QRS Duration:  84 QT Interval:  338 QTC Calculation: 420 R Axis:   55  Text Interpretation: Normal sinus rhythm Normal ECG Confirmed by Darliss Rogue ([id number]) on 12/18/2023 8:12:49 AM    Recent Labs: 10/09/2023: ALT 21; BUN 6; Creatinine, Ser 1.07; Hemoglobin 16.7; Platelets 175; Potassium 3.7; Sodium 138  Recent Lipid Panel    Component Value Date/Time   CHOL 206 (H) 10/09/2023 0841   TRIG 215 (H) 10/09/2023 0841   HDL 35 (L) 10/09/2023 0841   CHOLHDL 4.1 09/13/2022 0845   VLDL 41 (H) 09/13/2022 0845   LDLCALC 132 (H) 10/09/2023 0841     Risk  Assessment/Calculations:          Physical Exam:    VS:  BP (!) 140/100   Pulse 97   Ht 5' 8 (1.727 m)   Wt 238 lb 3.2 oz (108 kg)   SpO2 97%   BMI 36.22 kg/m     Wt Readings from Last 3 Encounters:  12/18/23 238 lb 3.2 oz (108 kg)  12/02/23 241 lb 12.8 oz (109.7 kg)  11/24/23 239 lb 6.4 oz (108.6 kg)     GEN:  Well nourished, well developed in no acute distress HEENT: Normal NECK: No JVD; No carotid bruits CARDIAC: RRR, no murmurs, rubs, gallops RESPIRATORY:  Clear to auscultation without rales, wheezing or rhonchi  ABDOMEN: Soft, non-tender, non-distended MUSCULOSKELETAL:  No edema; No deformity  SKIN: Warm and dry NEUROLOGIC:  Alert and oriented x 3 PSYCHIATRIC:  Normal affect   ASSESSMENT:    1. Coronary artery disease involving native coronary artery of native heart, unspecified whether angina present   2. Mixed hyperlipidemia   3. Primary hypertension    PLAN:    In order of problems listed above:  CAD, LHC 07/2021 70 to 90% D1-2 small for intervention, moderate LAD, OM 2, PDA disease.  Echocardiogram 60 to 65%.  Denies chest pain.  Continue aspirin  81 mg, Toprol -XL 25 mg daily.  Increase Lipitor to 80 mg daily.   Hyperlipidemia, cholesterol  well-controlled.  Increase Lipitor 80 mg daily.  Recheck lipid panel in 6 months.  Follow-up in 12 months.      Medication Adjustments/Labs and Tests Ordered: Current medicines are reviewed at length with the patient today.  Concerns regarding medicines are outlined above.  Orders Placed This Encounter  Procedures   Lipid Profile   EKG 12-Lead   Meds ordered this encounter  Medications   atorvastatin  (LIPITOR) 80 MG tablet    Sig: Take 0.5 tablets (40 mg total) by mouth daily.    Dispense:  30 tablet    Refill:  3    Patient Instructions  Medication Instructions:  - INCREASE lipitor to 80 mg daily   *If you need a refill on your cardiac medications before your next appointment, please call your pharmacy*  Lab Work: Your provider would like for you to return in 6 months to have the following labs drawn: fasting lipid panel.   Please go to [facility] [address] ([facility] [address], [place] [address] You do not need an appointment.  They are open from 8 am- 4:30 pm.  Lunch from 1:00 pm- 2:00 pm You DO need to be fasting.  If you have labs (blood work) drawn today and your tests are completely normal, you will receive your results only by: MyChart Message (if you have MyChart) OR A paper copy in the mail If you have any lab test that is abnormal or we need to change your treatment, we will call you to review the results.  Testing/Procedures: No test ordered today   Follow-Up: At [facility], you and your health needs are our priority.  As part of our continuing mission to provide you with exceptional heart care, our providers are all part of one team.  This team includes your primary Cardiologist (physician) and Advanced Practice Providers or APPs (Physician Assistants and Nurse Practitioners) who all work together to provide you with the care you need, when you need it.  Your next appointment:   1 year(s)  Provider:   You may see  Redell Cave, MD  or one of the following Advanced Practice Providers on your designated Care Team:   Lonni Meager, NP Lesley Maffucci, PA-C Bernardino Bring, PA-C Cadence Two Strike, PA-C Tylene Lunch, NP Barnie Hila, NP    We recommend signing up for the patient portal called MyChart.  Sign up information is provided on this After Visit Summary.  MyChart is used to connect with patients for Virtual Visits (Telemedicine).  Patients are able to view lab/test results, encounter notes, upcoming appointments, etc.  Non-urgent messages can be sent to your provider as well.   To learn more about what you can do with MyChart, go to [email or web address].      Signed, Redell Cave, MD  12/18/2023 10:18 AM    [facility]

## 2023-12-30 ENCOUNTER — Other Ambulatory Visit: Payer: Self-pay

## 2023-12-30 DIAGNOSIS — R809 Proteinuria, unspecified: Secondary | ICD-10-CM

## 2023-12-30 MED ORDER — METFORMIN HCL ER 500 MG PO TB24: 1000.0000 mg | ORAL_TABLET | Freq: Two times a day (BID) | ORAL | 1 refills | Status: DC

## 2023-12-30 NOTE — Progress Notes (Signed)
   12/30/2023 Name: Casey Cole MRN: [id number] DOB: December 20, 1975  Casey Cole is a 48 y.o. year old male who presented for a telephone visit to follow-up on management of diabetes  Subjective:  Care Team: Primary Care Provider: Vicci Duwaine SQUIBB, DO ; Next Scheduled Visit: 01/13/2024  Medication Access/Adherence  Current Pharmacy:  [id number] - [place], [place] - [address] AT [facility] OF [address] [place] [address] Phone: [phone] Fax: [phone]  Diabetes: Current medications: Mounjaro  12.5mg  weekly, Metformin  1000mg  BID -Patient switched from Ozempic  2mg  to Mounjaro  12.5mg  approximately 3-4 weeks ago, and he endorses tolerating medication well -Does endorse some stomach upset present prior to changing to Mounjaro  that he believes may be related to metformin - he is currently using IR metformin  tablets -Due for next Mounjaro  dose today -Does not monitor BG everyday but states readings have improved since starting Mounjaro  12.5mg - FBG is now averaging 140 and post-prandial around 200; readings were previously 240-250 -Last A1c was 8.9% 10/09/2023  Objective: Lab Results  Component Value Date   HGBA1C 8.9 (H) 10/09/2023   Lab Results  Component Value Date   CREATININE 1.07 10/09/2023   BUN 6 10/09/2023   NA 138 10/09/2023   K 3.7 10/09/2023   CL 101 10/09/2023   CO2 22 10/09/2023   Assessment/Plan:   Diabetes: -Currently uncontrolled but BG readings reflect improvement -I recommend changing metformin  IR to XR to decrease stomach upset- order pending for PCP to sign if in agreement.  Test claim reflects metformin  XR 500mg  taking 2 tablets BID would be covered with $4.33 copay monthly -PA was approved for Mounjaro  12.5mg  last month and was supposed to be active through 2026, but [employer] is requiring another prior authorization- coordinating with the PA team to work on this -Sees PCP again 9/9 and will be due for  A1c; I expect improvement but will not see full results of Mounjaro  for another 2 months since this was just started a month ago   Follow Up Plan: 10/22  Channing DELENA Mealing, PharmD, DPLA

## 2024-01-01 ENCOUNTER — Other Ambulatory Visit (HOSPITAL_COMMUNITY): Payer: Self-pay

## 2024-01-02 ENCOUNTER — Other Ambulatory Visit (HOSPITAL_COMMUNITY): Payer: Self-pay

## 2024-01-02 ENCOUNTER — Telehealth: Payer: Self-pay

## 2024-01-02 NOTE — Telephone Encounter (Signed)
 Pharmacy Patient Advocate Encounter   Received notification from [facility] that prior authorization for Mounjaro  is required/requested.   Insurance verification completed.   The patient is insured through [employer] .   Per test claim: PA required; PA submitted to above mentioned insurance via Latent Key/confirmation #/EOC [id number] Status is pending

## 2024-01-08 ENCOUNTER — Telehealth: Payer: Self-pay

## 2024-01-08 NOTE — Progress Notes (Signed)
   01/08/2024  Patient ID: Casey Cole, male   DOB: 1975-05-19, 48 y.o.   MRN: [id number]  New PA for Mounjaro  12.5mg  weekly appears to have been approved, as test claim is now reflecting $25 copay.  Patient is aware and plans to pick medication up.  Channing DELENA Mealing, PharmD, DPLA

## 2024-01-13 ENCOUNTER — Ambulatory Visit: Payer: PRIVATE HEALTH INSURANCE | Admitting: Family Medicine

## 2024-01-23 NOTE — Telephone Encounter (Signed)
 Have not received approval, however, per [facility], medication is now going through for 25 dollars, indicating approval.

## 2024-02-11 ENCOUNTER — Encounter: Payer: Self-pay | Admitting: Gastroenterology

## 2024-02-11 NOTE — Anesthesia Preprocedure Evaluation (Addendum)
 Anesthesia Evaluation  Patient identified by MRN, date of birth, ID band Patient awake    Reviewed: Allergy & Precautions, NPO status , Patient's Chart, lab work & pertinent test results  Airway Mallampati: II  TM Distance: >3 FB Neck ROM: full    Dental  (+) Teeth Intact   Pulmonary neg pulmonary ROS, sleep apnea , COPD, Current Smoker and Patient abstained from smoking.   Pulmonary exam normal  + decreased breath sounds      Cardiovascular Exercise Tolerance: Good hypertension, Pt. on medications + CAD  negative cardio ROS Normal cardiovascular exam Rhythm:Regular Rate:Normal  07-17-21 cath  1. Multivessel coronary artery disease, as detailed below.  The most severe stenoses involve small D1 and acute marginal branches, which demonstrate 70-90% narrowing but are too small for PCI.  Moderate disease involving the LAD, OM2, and rPDA is not hemodynamically significant (iFR 0.96-1.0). 2. Hyperdynamic left ventricular contraction (LVEF >65%) with normal filling pressure (LVEDP 10 mmHg) and mild LVOT gradient (peak-to-peak gradient ~15 mmHg). Recommendations: 1. Escalate antianginal therapy; will add metoprolol  tartrate 25 mg twice daily. 2. Medical therapy and risk factor modification to prevent progression of coronary artery disease. 3. Follow-up echocardiogram. 4. Consider workup for noncardiac causes of chest pain, particularly GI pathology in the setting of recent NSAID use. [name], MD  07-23-21 echo 1. Left ventricular ejection fraction, by estimation, is 60 to 65%. Left  ventricular ejection fraction by 2D MOD biplane is 63.2 %. The left  ventricle has normal function. The left ventricle has no regional wall  motion abnormalities. Left ventricular  diastolic parameters are consistent with Grade I diastolic dysfunction  (impaired relaxation).   2. Right ventricular systolic function is normal. The right ventricular   size is normal.   3. The mitral valve is normal in structure. No evidence of mitral valve  regurgitation.   4. The aortic valve is tricuspid. Aortic valve regurgitation is not  visualized.   5. The inferior vena cava is normal in size with <50% respiratory  variability, suggesting right atrial pressure of 8 mmHg.     12-18-23 office note Casey Cole is a 48 y.o. male with a hx of CAD (LHC 07/2021 70 to 90% D1-too small for intervention, moderate LAD, OM 2, PDA disease), hyperlipidemia, diabetes, former smoker x20+ years, OSA who presents for follow-up.    Doing okay, denies chest pain or shortness of breath.  Compliant with aspirin , Lipitor as prescribed.  Has not taken Toprol -XL over the past 2 days.  BP slightly elevated this morning.  Overall doing okay with no new concerns at this time.   Prior notes LHC 07/2021 70 to 90% D1-2 small for intervention, moderate LAD, OM 2, PDA disease Echo 07/23/2021 EF 60 to 65%, impaired relaxation Coronary CTA 07/12/2021 calcium  score 116, severe stenosis in PDA, severe stenosis OM 2, moderate LAD disease.  12-18-23 EKG EKG Interpretation Date/Time:                  Thursday December 18 2023 08:04:10 EDT Ventricular Rate:         93 PR Interval:                 164 QRS Duration:             84 QT Interval:                 338 QTC Calculation:420 R Axis:  55   Text Interpretation:Normal sinus rhythm Normal ECG Confirmed by Darliss Rogue ([id number]) on 12/18/2023    Neuro/Psych   Anxiety     negative neurological ROS  negative psych ROS   GI/Hepatic negative GI ROS, Neg liver ROS,GERD  ,,  Endo/Other  negative endocrine ROSdiabetes, Type 2, Oral Hypoglycemic Agents    Renal/GU negative Renal ROS  negative genitourinary   Musculoskeletal   Abdominal Normal abdominal exam  (+)   Peds negative pediatric ROS (+)  Hematology negative hematology ROS (+)   Anesthesia Other Findings Sleep  apnea  Hyperlipidemia Hypertension  Diabetes mellitus without complication  Abscess of superficial perineal space Depression Recurrent major depressive disorder, in full remission  GERD (gastroesophageal reflux disease) Coronary artery disease involving native coronary artery of native heart, unspecified whether angina present Grade I diastolic dysfunction Benign hypertensive renal disease Moderate persistent reactive airway disease without complication Chronic cough      Reproductive/Obstetrics negative OB ROS                              Anesthesia Physical Anesthesia Plan  ASA: 3  Anesthesia Plan: General   Post-op Pain Management:    Induction: Intravenous  PONV Risk Score and Plan: Propofol  infusion and TIVA  Airway Management Planned: Natural Airway and Nasal Cannula  Additional Equipment:   Intra-op Plan:   Post-operative Plan:   Informed Consent: I have reviewed the patients History and Physical, chart, labs and discussed the procedure including the risks, benefits and alternatives for the proposed anesthesia with the patient or authorized representative who has indicated his/her understanding and acceptance.     Dental Advisory Given  Plan Discussed with: CRNA  Anesthesia Plan Comments:         Anesthesia Quick Evaluation

## 2024-02-13 ENCOUNTER — Ambulatory Visit: Payer: PRIVATE HEALTH INSURANCE | Admitting: Family Medicine

## 2024-02-19 ENCOUNTER — Ambulatory Visit: Payer: Self-pay | Admitting: Anesthesiology

## 2024-02-19 ENCOUNTER — Ambulatory Visit
Admission: RE | Admit: 2024-02-19 | Discharge: 2024-02-19 | Disposition: A | Discharge status: Home / Self Care | Attending: Gastroenterology | Admitting: Gastroenterology

## 2024-02-19 ENCOUNTER — Other Ambulatory Visit: Payer: Self-pay

## 2024-02-19 ENCOUNTER — Encounter: Payer: Self-pay | Admitting: Gastroenterology

## 2024-02-19 ENCOUNTER — Encounter
Admission: RE | Disposition: A | Discharge status: Home / Self Care | Payer: Self-pay | Source: Home / Self Care | Attending: Gastroenterology

## 2024-02-19 DIAGNOSIS — E119 Type 2 diabetes mellitus without complications: Secondary | ICD-10-CM | POA: Diagnosis not present

## 2024-02-19 DIAGNOSIS — Z7984 Long term (current) use of oral hypoglycemic drugs: Secondary | ICD-10-CM | POA: Diagnosis not present

## 2024-02-19 DIAGNOSIS — G4730 Sleep apnea, unspecified: Secondary | ICD-10-CM | POA: Diagnosis not present

## 2024-02-19 DIAGNOSIS — K635 Polyp of colon: Secondary | ICD-10-CM | POA: Diagnosis not present

## 2024-02-19 DIAGNOSIS — Z7951 Long term (current) use of inhaled steroids: Secondary | ICD-10-CM | POA: Diagnosis not present

## 2024-02-19 DIAGNOSIS — F419 Anxiety disorder, unspecified: Secondary | ICD-10-CM | POA: Insufficient documentation

## 2024-02-19 DIAGNOSIS — Z7982 Long term (current) use of aspirin: Secondary | ICD-10-CM | POA: Insufficient documentation

## 2024-02-19 DIAGNOSIS — F17210 Nicotine dependence, cigarettes, uncomplicated: Secondary | ICD-10-CM | POA: Insufficient documentation

## 2024-02-19 DIAGNOSIS — J449 Chronic obstructive pulmonary disease, unspecified: Secondary | ICD-10-CM | POA: Insufficient documentation

## 2024-02-19 DIAGNOSIS — Z1211 Encounter for screening for malignant neoplasm of colon: Secondary | ICD-10-CM | POA: Insufficient documentation

## 2024-02-19 DIAGNOSIS — D120 Benign neoplasm of cecum: Secondary | ICD-10-CM | POA: Diagnosis not present

## 2024-02-19 DIAGNOSIS — Z79899 Other long term (current) drug therapy: Secondary | ICD-10-CM | POA: Diagnosis not present

## 2024-02-19 DIAGNOSIS — I10 Essential (primary) hypertension: Secondary | ICD-10-CM | POA: Diagnosis not present

## 2024-02-19 DIAGNOSIS — I2510 Atherosclerotic heart disease of native coronary artery without angina pectoris: Secondary | ICD-10-CM | POA: Diagnosis not present

## 2024-02-19 DIAGNOSIS — K219 Gastro-esophageal reflux disease without esophagitis: Secondary | ICD-10-CM | POA: Insufficient documentation

## 2024-02-19 HISTORY — DX: Atherosclerotic heart disease of native coronary artery without angina pectoris: I25.10

## 2024-02-19 HISTORY — DX: Moderate persistent asthma, uncomplicated: J45.40

## 2024-02-19 HISTORY — DX: Chronic cough: R05.3

## 2024-02-19 HISTORY — PX: COLONOSCOPY: SHX5424

## 2024-02-19 HISTORY — DX: Other ill-defined heart diseases: I51.89

## 2024-02-19 HISTORY — DX: Gastro-esophageal reflux disease without esophagitis: K21.9

## 2024-02-19 HISTORY — PX: POLYPECTOMY: SHX149

## 2024-02-19 HISTORY — DX: Hypertensive chronic kidney disease with stage 1 through stage 4 chronic kidney disease, or unspecified chronic kidney disease: I12.9

## 2024-02-19 LAB — GLUCOSE, CAPILLARY: Glucose-Capillary: 133 mg/dL — ABNORMAL HIGH (ref 70–99)

## 2024-02-19 SURGERY — COLONOSCOPY
Anesthesia: General

## 2024-02-19 MED ORDER — PROPOFOL 500 MG/50ML IV EMUL
INTRAVENOUS | Status: DC | PRN
  Administered 2024-02-19: 140 ug/kg/min via INTRAVENOUS

## 2024-02-19 MED ORDER — SODIUM CHLORIDE 0.9 % IV SOLN: INTRAVENOUS | Status: DC

## 2024-02-19 MED ORDER — PROPOFOL 10 MG/ML IV BOLUS
INTRAVENOUS | Status: DC | PRN
  Administered 2024-02-19: 70 mg via INTRAVENOUS

## 2024-02-19 NOTE — Anesthesia Postprocedure Evaluation (Signed)
 Anesthesia Post Note  Patient: Casey Cole  Procedure(s) Performed: COLONOSCOPY POLYPECTOMY, INTESTINE  Patient location during evaluation: PACU Anesthesia Type: General Level of consciousness: awake Pain management: satisfactory to patient Vital Signs Assessment: post-procedure vital signs reviewed and stable Respiratory status: spontaneous breathing Cardiovascular status: stable Anesthetic complications: no   No notable events documented.   Last Vitals:  Vitals:   02/19/24 0815 02/19/24 0905  BP: 109/76 105/63  Pulse: 92   Resp: 18   Temp: (!) 35.6 C (!) 36.4 C  SpO2: 95%     Last Pain:  Vitals:   02/19/24 0915  TempSrc:   PainSc: 0-No pain                 VAN STAVEREN,Christabelle Hanzlik

## 2024-02-19 NOTE — H&P (Signed)
 Ruel Kung , MD [address], [place], [place], [address] Phone: [phone] Fax: [phone]  Primary Care Physician:  Vicci Duwaine SQUIBB, DO   Pre-Procedure History & Physical: HPI:  Casey Cole is a 48 y.o. male is here for an colonoscopy.   Past Medical History:  Diagnosis Date   Abscess of superficial perineal space 08/23/2015   Benign hypertensive renal disease    Chronic cough    Coronary artery disease involving native coronary artery of native heart, unspecified whether angina present    Depression 10/27/2015   Diabetes mellitus without complication (HCC)    GERD (gastroesophageal reflux disease)    Grade I diastolic dysfunction    Hyperlipidemia    Hypertension    Moderate persistent reactive airway disease without complication    Recurrent major depressive disorder, in full remission 10/26/2018   Sleep apnea     Past Surgical History:  Procedure Laterality Date   CHOLECYSTECTOMY     COLONOSCOPY WITH PROPOFOL  N/A 01/17/2022   Procedure: COLONOSCOPY WITH PROPOFOL ;  Surgeon: Kung Ruel, MD;  Location: [facility] ENDOSCOPY;  Service: Gastroenterology;  Laterality: N/A;   CORONARY PRESSURE/FFR WITH 3D MAPPING N/A 07/17/2021   Procedure: Coronary Pressure Wire/FFR w/3D Mapping;  Surgeon: Mady Bruckner, MD;  Location: [facility] INVASIVE CV LAB;  Service: Cardiovascular;  Laterality: N/A;   INCISE AND DRAIN ABCESS Left 07-12-15   left buttock   INCISION AND DRAINAGE PERIRECTAL ABSCESS N/A 08/23/2015   Procedure: IRRIGATION AND DEBRIDEMENT PERIRECTAL ABSCESS;  Surgeon: Reyes LELON Cota, MD;  Location: [facility] ORS;  Service: General;  Laterality: N/A;  Anal Scope   LEFT HEART CATH AND CORONARY ANGIOGRAPHY Left 07/17/2021   Procedure: LEFT HEART CATH AND CORONARY ANGIOGRAPHY;  Surgeon: Mady Bruckner, MD;  Location: [facility] INVASIVE CV LAB;  Service: Cardiovascular;  Laterality: Left;   VASECTOMY      Prior to Admission medications   Medication Sig Start Date End Date  Taking? Authorizing Provider  albuterol  (VENTOLIN  HFA) 108 (90 Base) MCG/ACT inhaler Inhale 2 puffs into the lungs every 6 (six) hours as needed for wheezing or shortness of breath. 10/13/23   Johnson, Megan P, DO  aspirin  EC 81 MG tablet Take 1 tablet (81 mg total) by mouth daily. Swallow whole. 10/13/23   Johnson, Megan P, DO  atorvastatin  (LIPITOR) 80 MG tablet Take 0.5 tablets (40 mg total) by mouth daily. 12/18/23   Darliss Rogue, MD  budesonide -formoterol  (SYMBICORT ) 160-4.5 MCG/ACT inhaler Inhale 2 puffs into the lungs 2 (two) times daily. 10/13/23   Vicci Duwaine P, DO  glucose blood test strip Use as instructed 10/14/22   Vicci, Megan P, DO  guselkumab (TREMFYA PEN) 100 MG/ML pen     [provider]  Guselkumab (TREMFYA [place]) Inject 1 Dose into the skin as directed. Every 8 weeks after starter doses.    [provider]  metFORMIN  (GLUCOPHAGE -XR) 500 MG 24 hr tablet Take 2 tablets (1,000 mg total) by mouth 2 (two) times daily with a meal. 12/30/23   Johnson, Megan P, DO  metoprolol  succinate (TOPROL -XL) 25 MG 24 hr tablet Take 1 tablet (25 mg total) by mouth daily. 10/13/23   Johnson, Megan P, DO  omeprazole  (PRILOSEC) 20 MG capsule Take 1 capsule (20 mg total) by mouth daily. 10/13/23   Johnson, Megan P, DO  tirzepatide  (MOUNJARO ) 12.5 MG/0.5ML Pen Inject 12.5 mg into the skin once a week. 11/05/23   Vicci Duwaine SQUIBB, DO    Allergies as of 02/02/2024 - Review  Complete 12/18/2023  Allergen Reaction Noted   Methylprednisolone  07/14/2020   Prednisone  Other (See Comments) 09/19/2022    Family History  Problem Relation Age of Onset   Hypertension Father    Stroke Paternal Grandfather    Heart attack Paternal Grandfather     Social History   Socioeconomic History   Marital status: Married    Spouse name: Not on file   Number of children: Not on file   Years of education: Not on file   Highest education level: Associate degree: academic program  Occupational History    Not on file  Tobacco Use   Smoking status: Every Day    Current packs/day: 0.00    Types: Cigarettes    Last attempt to quit: 07/14/2021    Years since quitting: 2.6   Smokeless tobacco: Never  Vaping Use   Vaping status: Never Used  Substance and Sexual Activity   Alcohol use: Yes    Comment: rarely   Drug use: No   Sexual activity: Yes  Other Topics Concern   Not on file  Social History Narrative   Caffeine use: 2-3 per day (soda)   Social Drivers of Health   Financial Resource Strain: High Risk (10/14/2022)   Overall Financial Resource Strain (CARDIA)    Difficulty of Paying Living Expenses: Hard  Food Insecurity: No Food Insecurity (10/14/2022)   Hunger Vital Sign    Worried About Running Out of Food in the Last Year: Never true    Ran Out of Food in the Last Year: Never true  Transportation Needs: No Transportation Needs (10/14/2022)   PRAPARE - Administrator, Civil Service (Medical): No    Lack of Transportation (Non-Medical): No  Physical Activity: Insufficiently Active (10/14/2022)   Exercise Vital Sign    Days of Exercise per Week: 3 days    Minutes of Exercise per Session: 30 min  Stress: Stress Concern Present (10/14/2022)   [employer] of Occupational Health - Occupational Stress Questionnaire    Feeling of Stress : Rather much  Social Connections: Unknown (10/14/2022)   Social Connection and Isolation Panel    Frequency of Communication with Friends and Family: Patient declined    Frequency of Social Gatherings with Friends and Family: Patient declined    Attends Religious Services: Patient declined    Database administrator or Organizations: Patient declined    Attends Engineer, structural: Not on file    Marital Status: Married  Catering manager Violence: Not on file    Review of Systems: See HPI, otherwise negative ROS  Physical Exam: Ht 5' 8 (1.727 m)   Wt 106.6 kg   BMI 35.73 kg/m  General:   Alert,  pleasant and  cooperative in NAD Head:  Normocephalic and atraumatic. Neck:  Supple; no masses or thyromegaly. Lungs:  Clear throughout to auscultation, normal respiratory effort.    Heart:  +S1, +S2, Regular rate and rhythm, No edema. Abdomen:  Soft, nontender and nondistended. Normal bowel sounds, without guarding, and without rebound.   Neurologic:  Alert and  oriented x4;  grossly normal neurologically.  Impression/Plan: Daymon Hora is here for an colonoscopy to be performed for surveillance due to prior history of colon polyps   Risks, benefits, limitations, and alternatives regarding  colonoscopy have been reviewed with the patient.  Questions have been answered.  All parties agreeable.   Ruel Kung, MD  02/19/2024, 8:02 AM

## 2024-02-19 NOTE — Transfer of Care (Signed)
 Immediate Anesthesia Transfer of Care Note  Patient: Casey Cole  Procedure(s) Performed: COLONOSCOPY POLYPECTOMY, INTESTINE  Patient Location: PACU  Anesthesia Type:General  Level of Consciousness: awake, alert , and oriented  Airway & Oxygen Therapy: Patient Spontanous Breathing  Post-op Assessment: Report given to RN and Post -op Vital signs reviewed and stable  Post vital signs: Reviewed and stable  Last Vitals:  Vitals Value Taken Time  BP 105/63 02/19/24 09:06  Temp    Pulse 95 02/19/24 09:06  Resp 17 02/19/24 09:06  SpO2 96 % 02/19/24 09:06  Vitals shown include unfiled device data.  Last Pain:  Vitals:   02/19/24 0815  TempSrc: Tympanic         Complications: No notable events documented.

## 2024-02-19 NOTE — Op Note (Signed)
 [facility] Gastroenterology Patient Name: Casey Cole Procedure Date: 02/19/2024 8:39 AM MRN: [id number] Account #: [id number] Date of Birth: [birth year] Admit Type: Outpatient Age: 48 Room: [facility] ENDO ROOM 3 Gender: Male Note Status: Finalized Instrument Name: Colon Scope [id number] Procedure:             Colonoscopy Indications:           Surveillance: Personal history of adenomatous polyps                         on last colonoscopy > 3 years ago, Last colonoscopy:                         September 2023 Providers:             Ruel Kung MD, MD Medicines:             Monitored Anesthesia Care Complications:         No immediate complications. Procedure:             Pre-Anesthesia Assessment:                        - Prior to the procedure, a History and Physical was                         performed, and patient medications, allergies and                         sensitivities were reviewed. The patient's tolerance                         of previous anesthesia was reviewed.                        - The risks and benefits of the procedure and the                         sedation options and risks were discussed with the                         patient. All questions were answered and informed                         consent was obtained.                        - ASA Grade Assessment: II - A patient with mild                         systemic disease.                        After obtaining informed consent, the colonoscope was                         passed under direct vision. Throughout the procedure,                         the patient's blood pressure, pulse, and oxygen  saturations were monitored continuously. The                         Colonoscope was introduced through the anus and                         advanced to the the cecum, identified by the                         appendiceal orifice. The colonoscopy was performed                          with ease. The patient tolerated the procedure well.                         The quality of the bowel preparation was good. The                         ileocecal valve, appendiceal orifice, and rectum were                         photographed. Findings:      The perianal and digital rectal examinations were normal.      Two sessile polyps were found in the cecum. The polyps were 4 to 5 mm in       size. These polyps were removed with a cold snare. Resection and       retrieval were complete.      Two sessile polyps were found in the sigmoid colon. The polyps were 4 to       5 mm in size. These polyps were removed with a cold snare. Resection and       retrieval were complete.      The exam was otherwise without abnormality on direct and retroflexion       views. Impression:            - Two 4 to 5 mm polyps in the cecum, removed with a                         cold snare. Resected and retrieved.                        - Two 4 to 5 mm polyps in the sigmoid colon, removed                         with a cold snare. Resected and retrieved.                        - The examination was otherwise normal on direct and                         retroflexion views. Recommendation:        - Discharge patient to home (with escort).                        - Resume previous diet.                        - Continue present medications.                        -  Await pathology results.                        - Repeat colonoscopy in 3 years for surveillance. Procedure Code(s):     --- Professional ---                        [phone], Colonoscopy, flexible; with removal of                         tumor(s), polyp(s), or other lesion(s) by snare                         technique Diagnosis Code(s):     --- Professional ---                        Z86.010, Personal history of colonic polyps                        D12.0, Benign neoplasm of cecum                        D12.5, Benign neoplasm of sigmoid  colon CPT copyright 2022 American Medical Association. All rights reserved. The codes documented in this report are preliminary and upon coder review may  be revised to meet current compliance requirements. Ruel Kung, MD Ruel Kung MD, MD 02/19/2024 8:56:28 AM This report has been signed electronically. Number of Addenda: 0 Note Initiated On: 02/19/2024 8:39 AM Scope Withdrawal Time: 0 hours 9 minutes 12 seconds  Total Procedure Duration: 0 hours 9 minutes 59 seconds  Estimated Blood Loss:  Estimated blood loss: none.      [facility]

## 2024-02-20 LAB — SURGICAL PATHOLOGY

## 2024-02-25 ENCOUNTER — Other Ambulatory Visit: Payer: PRIVATE HEALTH INSURANCE

## 2024-02-25 DIAGNOSIS — E782 Mixed hyperlipidemia: Secondary | ICD-10-CM

## 2024-02-25 DIAGNOSIS — R809 Proteinuria, unspecified: Secondary | ICD-10-CM

## 2024-02-25 NOTE — Progress Notes (Signed)
   02/25/2024 Name: Casey Cole MRN: [id number] DOB: Jun 24, 1975  Casey Cole is a 48 y.o. year old male who presented for a telephone visit to follow-up on management of diabetes  Subjective:  Care Team: Primary Care Provider: Vicci Duwaine SQUIBB, DO  Medication Access/Adherence  Current Pharmacy:  [facility] [phone], [place] - [address] AT [facility] [address] [place] [address] Phone: [phone] Fax: [phone]  Diabetes: Current medications: Mounjaro  12.5mg  weekly, Metformin  1000mg  BID -Patient switched from Ozempic  2mg  to Mounjaro  12.5mg  approximately 2 months ago, and he endorses tolerating medication well with no adverse side effects -Does endorse some stomach upset present prior to changing to Mounjaro  that he believes may be related to metformin - he is currently using IR metformin  tablets and has not switched to XR (on file at pharmacy for next fill) -Does not monitor BG everyday but states readings have improved since starting Mounjaro  12.5mg - FBG is now averaging 130-135 and post-prandial <200; readings were previously 240-250 -Last A1c was 8.9% 10/09/2023 -ACEi/ARB for cardiorenal protection:  no -Statin for ASCVD risk reduction:  atorvastatin  80mg  daily; LDL elevated at 132 on 6/5 -UACR <30 on 6/5  Objective: Lab Results  Component Value Date   HGBA1C 8.9 (H) 10/09/2023   Lab Results  Component Value Date   CREATININE 1.07 10/09/2023   BUN 6 10/09/2023   NA 138 10/09/2023   K 3.7 10/09/2023   CL 101 10/09/2023   CO2 22 10/09/2023   Assessment/Plan:   Diabetes: -A1c, LDL, and BP not at goal -UACR at goal -Home BG reflects improvement in diabetes control.  Patient missed PCP follow-up earlier this month and plans to reschedule.  I do recommend a follow-up visit, A1c, CMP, and lipid panel before EOY -If A1c >7%, increase Mounjaro  to 15mg  weekly -If LDL >70, verify adherence to atorvastatin  80mg - fill history  reflects 40mg  may have been most recently filled.  If good adherence, consider changing to rosuvastatin  40mg  and/or adding ezetimibe 10mg  daily -Based on last OV BP of 140/100 at cardiology in August, could consider addition of lisinopril  5mg  daily to also provide cardiorenal protection in patient with diabetes -I recommend changing metformin  IR to XR to decrease stomach upset- prescription on file at pharmacy for when patient runs out of IR tablets -Continue to monitor and record BG regularly  Channing DELENA Mealing, PharmD, DPLA

## 2024-04-06 ENCOUNTER — Ambulatory Visit (INDEPENDENT_AMBULATORY_CARE_PROVIDER_SITE_OTHER): Payer: PRIVATE HEALTH INSURANCE | Admitting: Urology

## 2024-04-06 VITALS — BP 135/87 | HR 101 | Ht 68.0 in | Wt 227.0 lb

## 2024-04-06 DIAGNOSIS — N486 Induration penis plastica: Secondary | ICD-10-CM | POA: Diagnosis not present

## 2024-04-06 NOTE — Progress Notes (Signed)
   04/06/2024 2:29 PM   Casey Cole 1975/07/05 [id number]  Reason for visit: Follow up peyronies disease  History: Initial visit July 2025 for penile pain with erections and mild upward curvature, easily palpable plaque at that time  Physical Exam: BP 135/87   Pulse (!) 101   Ht 5' 8 (1.727 m)   Wt 227 lb (103 kg)   BMI 34.52 kg/m  Easily palpable 2 cm dorsal penile plaque mid shaft   Today: He reports penile pain has since resolved, no pain or problems with erections Curvature is increased, now approximately 45 degrees upward, but not bothersome to patient or partner  Plan:   Peyronies disease: Appears to be in the stable phase at this time, he is not bothered by curvature and no problems with erections.   We discussed options including observation, Xiaflex injections, referral to consider penile plication.  He prefers follow-up as needed.    Casey JAYSON Burnet, MD  [facility] [address], [address] [place], [place] [address] [phone]

## 2024-04-06 NOTE — Patient Instructions (Signed)
 Visit [email or web address] for more information about Peyronies disease, treatment options, and xiaflex injections

## 2024-04-14 ENCOUNTER — Ambulatory Visit: Admitting: Family Medicine

## 2024-04-14 ENCOUNTER — Encounter: Payer: Self-pay | Admitting: Family Medicine

## 2024-04-14 VITALS — BP 130/88 | HR 98 | Temp 97.8°F | Ht 68.0 in | Wt 227.0 lb

## 2024-04-14 DIAGNOSIS — I129 Hypertensive chronic kidney disease with stage 1 through stage 4 chronic kidney disease, or unspecified chronic kidney disease: Secondary | ICD-10-CM

## 2024-04-14 DIAGNOSIS — F419 Anxiety disorder, unspecified: Secondary | ICD-10-CM

## 2024-04-14 DIAGNOSIS — Z7985 Long-term (current) use of injectable non-insulin antidiabetic drugs: Secondary | ICD-10-CM

## 2024-04-14 DIAGNOSIS — E1129 Type 2 diabetes mellitus with other diabetic kidney complication: Secondary | ICD-10-CM | POA: Diagnosis not present

## 2024-04-14 DIAGNOSIS — Z789 Other specified health status: Secondary | ICD-10-CM | POA: Diagnosis not present

## 2024-04-14 DIAGNOSIS — R809 Proteinuria, unspecified: Secondary | ICD-10-CM

## 2024-04-14 DIAGNOSIS — E782 Mixed hyperlipidemia: Secondary | ICD-10-CM

## 2024-04-14 LAB — MICROALBUMIN, URINE WAIVED
Creatinine, Urine Waived: 300 mg/dL (ref 10–300)
Microalb, Ur Waived: 30 mg/L — ABNORMAL HIGH (ref 0–19)
Microalb/Creat Ratio: <30 mg/g (ref <30)

## 2024-04-14 LAB — BAYER DCA HB A1C WAIVED: HB A1C (BAYER DCA - WAIVED): 5.9 % — ABNORMAL HIGH (ref 4.8–5.6)

## 2024-04-14 MED ORDER — OMEPRAZOLE 20 MG PO CPDR: 20.0000 mg | DELAYED_RELEASE_CAPSULE | Freq: Every day | ORAL | 1 refills | Status: AC

## 2024-04-14 MED ORDER — METFORMIN HCL ER 500 MG PO TB24: 500.0000 mg | ORAL_TABLET | Freq: Two times a day (BID) | ORAL | 0 refills | Status: AC

## 2024-04-14 MED ORDER — METOPROLOL SUCCINATE ER 25 MG PO TB24: 25.0000 mg | ORAL_TABLET | Freq: Every day | ORAL | 1 refills | Status: AC

## 2024-04-14 MED ORDER — ATORVASTATIN CALCIUM 80 MG PO TABS: 40.0000 mg | ORAL_TABLET | Freq: Every day | ORAL | 1 refills | Status: AC

## 2024-04-14 MED ORDER — TIRZEPATIDE 12.5 MG/0.5ML ~~LOC~~ SOAJ: 12.5000 mg | SUBCUTANEOUS | 1 refills | Status: AC

## 2024-04-14 NOTE — Assessment & Plan Note (Signed)
 Doing well not on medicine. Continue to monitor. Call with any concerns. Continue to monitor.

## 2024-04-14 NOTE — Assessment & Plan Note (Signed)
 Doing great with A1c of 5.9 down from 8.9! Will cut his metformin  to 500mg  BID and recheck in 3 months. Continue mounjaro . Call with any concerns. Continue to monitor.

## 2024-04-14 NOTE — Progress Notes (Signed)
 BP 130/88   Pulse 98   Temp 97.8 F (36.6 C) (Oral)   Ht 5' 8 (1.727 m)   Wt 227 lb (103 kg)   SpO2 97%   BMI 34.52 kg/m    Subjective:    Patient ID: Casey Cole, male    DOB: 1975/11/26, 48 y.o.   MRN: [id number]  HPI: Casey Cole is a 48 y.o. male  Chief Complaint  Patient presents with   Diabetes    Bgl avg @ at 125-130s   DIABETES Hypoglycemic episodes:no Polydipsia/polyuria: no Visual disturbance: no Chest pain: no Paresthesias: no Glucose Monitoring: no  Accucheck frequency: Not Checking Taking Insulin?: yes Blood Pressure Monitoring: not checking Retinal Examination: Up to Date Foot Exam: Up to Date Diabetic Education: Completed Pneumovax: Not up to Date Influenza: Not up to Date Aspirin : yes  HYPERTENSION / HYPERLIPIDEMIA Satisfied with current treatment? yes Duration of hypertension: chronic BP monitoring frequency: not checking BP medication side effects: no Past BP meds: metoprolol  Duration of hyperlipidemia: chronic Cholesterol medication side effects: no Cholesterol supplements: none Past cholesterol medications: atorvastatin  Medication compliance: excellent compliance Aspirin : yes Recent stressors: no Recurrent headaches: no Visual changes: no Palpitations: no Dyspnea: no Chest pain: no Lower extremity edema: no Dizzy/lightheaded: no  Relevant past medical, surgical, family and social history reviewed and updated as indicated. Interim medical history since our last visit reviewed. Allergies and medications reviewed and updated.  Review of Systems  Constitutional: Negative.   Respiratory: Negative.    Cardiovascular: Negative.   Musculoskeletal: Negative.   Neurological: Negative.   Psychiatric/Behavioral: Negative.      Per HPI unless specifically indicated above     Objective:    BP 130/88   Pulse 98   Temp 97.8 F (36.6 C) (Oral)   Ht 5' 8 (1.727 m)   Wt 227 lb (103 kg)   SpO2 97%   BMI 34.52 kg/m   Wt  Readings from Last 3 Encounters:  04/14/24 227 lb (103 kg)  04/06/24 227 lb (103 kg)  02/19/24 236 lb 9.6 oz (107.3 kg)    Physical Exam Vitals and nursing note reviewed.  Constitutional:      General: He is not in acute distress.    Appearance: Normal appearance. He is not ill-appearing, toxic-appearing or diaphoretic.  HENT:     Head: Normocephalic and atraumatic.     Right Ear: External ear normal.     Left Ear: External ear normal.     Nose: Nose normal.     Mouth/Throat:     Mouth: Mucous membranes are moist.     Pharynx: Oropharynx is clear.  Eyes:     General: No scleral icterus.       Right eye: No discharge.        Left eye: No discharge.     Extraocular Movements: Extraocular movements intact.     Conjunctiva/sclera: Conjunctivae normal.     Pupils: Pupils are equal, round, and reactive to light.  Cardiovascular:     Rate and Rhythm: Normal rate and regular rhythm.     Pulses: Normal pulses.     Heart sounds: Normal heart sounds. No murmur heard.    No friction rub. No gallop.  Pulmonary:     Effort: Pulmonary effort is normal. No respiratory distress.     Breath sounds: Normal breath sounds. No stridor. No wheezing, rhonchi or rales.  Chest:     Chest wall: No tenderness.  Musculoskeletal:  General: Normal range of motion.     Cervical back: Normal range of motion and neck supple.  Skin:    General: Skin is warm and dry.     Capillary Refill: Capillary refill takes less than 2 seconds.     Coloration: Skin is not jaundiced or pale.     Findings: No bruising, erythema, lesion or rash.  Neurological:     General: No focal deficit present.     Mental Status: He is alert and oriented to person, place, and time. Mental status is at baseline.  Psychiatric:        Mood and Affect: Mood normal.        Behavior: Behavior normal.        Thought Content: Thought content normal.        Judgment: Judgment normal.     Results for orders placed or performed  during the hospital encounter of 02/19/24  Surgical pathology   Collection Time: 02/19/24 12:00 AM  Result Value Ref Range   SURGICAL PATHOLOGY      SURGICAL PATHOLOGY [facility] [address] [place], [place] [address] Telephone [phone] or [phone] Fax [phone]  REPORT OF SURGICAL PATHOLOGY   Accession #: [id number] Patient Name: Cole, Casey Visit # : [id number]  MRN: [id number] Physician: Anna, Kiran DOB/Age 04-Sep-1975 (Age: 35) Gender: M Collected Date: 02/19/2024 Received Date: 02/19/2024  FINAL DIAGNOSIS       1. Cecum Polyp, cold snare x2 :       TUBULAR ADENOMA, 4 FRAGMENTS      NEGATIVE FOR HIGH-GRADE DYSPLASIA AND CARCINOMA       2. Sigmoid  Colon Polyp, cold snare x2 :       HYPERPLASTIC POLYP, 3 FRAGMENTS      NEGATIVE FOR DYSPLASIA AND CARCINOMA       ELECTRONIC SIGNATURE : [place]les[place] Md, Fred , Sports Administrator, International Aid/development Worker  MICROSCOPIC DESCRIPTION  CASE COMMENTS STAINS USED IN DIAGNOSIS: H&E H&E H&E    CLINICAL HISTORY  SPECIMEN(S) OBTAINED 1. Cecum Polyp, Cold Snare X2 2. Sigmoid  Colon Polyp, Cold Snare X2  SPEC IMEN COMMENTS: SPECIMEN CLINICAL INFORMATION: 1. History of colon polyps.Colon polyps    Gross Description 1. Received in formalin are tan, soft tissue fragments that are submitted in toto.Number:  multiple,   Size:  0.3 cm, 2 blocks. 2. Received in formalin are tan, soft tissue fragments that are submitted in toto.Number:  4,   Size:  0.7 cm, 1 block.mb 02-19-24        Report signed out from the following location(s) [place] [address] [place], [place] [address] CLIA #: [id number]  [facility] [address] [place], [place] [address] CLIA #: [id number]   Glucose, capillary   Collection Time: 02/19/24  8:31 AM  Result Value Ref Range   Glucose-Capillary 133 (H) 70 - 99 mg/dL      Assessment & Plan:   Problem  List Items Addressed This Visit       Endocrine   DM (diabetes mellitus), type 2 with renal complications (HCC)   Doing great with A1c of 5.9 down from 8.9! Will cut his metformin  to 500mg  BID and recheck in 3 months. Continue mounjaro . Call with any concerns. Continue to monitor.       Relevant Medications   atorvastatin  (LIPITOR) 80 MG tablet   tirzepatide  (MOUNJARO ) 12.5 MG/0.5ML Pen   metFORMIN  (GLUCOPHAGE -XR) 500 MG 24 hr tablet  Other Relevant Orders   Bayer DCA Hb A1c Waived   CBC with Differential/Platelet   Comprehensive metabolic panel with GFR   Microalbumin, Urine Waived     Genitourinary   Benign hypertensive renal disease   Under good control on current regimen. Continue current regimen. Continue to monitor. Call with any concerns. Refills given. Labs drawn today.       Relevant Orders   CBC with Differential/Platelet   Comprehensive metabolic panel with GFR     Other   Hyperlipidemia - Primary   Under good control on current regimen. Continue current regimen. Continue to monitor. Call with any concerns. Refills given. Labs drawn today.        Relevant Medications   atorvastatin  (LIPITOR) 80 MG tablet   metoprolol  succinate (TOPROL -XL) 25 MG 24 hr tablet   Other Relevant Orders   CBC with Differential/Platelet   Comprehensive metabolic panel with GFR   Lipid Panel w/o Chol/HDL Ratio   Anxiety   Doing well not on medicine. Continue to monitor. Call with any concerns. Continue to monitor.       Relevant Orders   CBC with Differential/Platelet   Comprehensive metabolic panel with GFR   Other Visit Diagnoses       Hepatitis B vaccination status unknown       Labs drawn today. Await results. Treat as needed.   Relevant Orders   CBC with Differential/Platelet   Comprehensive metabolic panel with GFR   Hepatitis B surface antibody,quantitative        Follow up plan: Return in about 3 months (around 07/13/2024).

## 2024-04-14 NOTE — Assessment & Plan Note (Signed)
 Under good control on current regimen. Continue current regimen. Continue to monitor. Call with any concerns. Refills given. Labs drawn today.

## 2024-04-15 ENCOUNTER — Ambulatory Visit: Payer: Self-pay | Admitting: Family Medicine

## 2024-04-15 DIAGNOSIS — N289 Disorder of kidney and ureter, unspecified: Secondary | ICD-10-CM

## 2024-04-15 LAB — LIPID PANEL W/O CHOL/HDL RATIO
Cholesterol, Total: 173 mg/dL (ref 100–199)
HDL: 30 mg/dL — ABNORMAL LOW (ref >39)
LDL Chol Calc (NIH): 102 mg/dL — ABNORMAL HIGH (ref 0–99)
Triglycerides: 239 mg/dL — ABNORMAL HIGH (ref 0–149)
VLDL Cholesterol Cal: 41 mg/dL — ABNORMAL HIGH (ref 5–40)

## 2024-04-15 LAB — CBC WITH DIFFERENTIAL/PLATELET
Basophils Absolute: 0.1 x10E3/uL (ref 0.0–0.2)
Basos: 1 %
EOS (ABSOLUTE): 0.1 x10E3/uL (ref 0.0–0.4)
Eos: 1 %
Hematocrit: 49.3 % (ref 37.5–51.0)
Hemoglobin: 16.8 g/dL (ref 13.0–17.7)
Immature Grans (Abs): 0 x10E3/uL (ref 0.0–0.1)
Immature Granulocytes: 0 %
Lymphocytes Absolute: 1.5 x10E3/uL (ref 0.7–3.1)
Lymphs: 20 %
MCH: 32 pg (ref 26.6–33.0)
MCHC: 34.1 g/dL (ref 31.5–35.7)
MCV: 94 fL (ref 79–97)
Monocytes Absolute: 0.7 x10E3/uL (ref 0.1–0.9)
Monocytes: 10 %
Neutrophils Absolute: 5.2 x10E3/uL (ref 1.4–7.0)
Neutrophils: 68 %
Platelets: 160 x10E3/uL (ref 150–450)
RBC: 5.25 x10E6/uL (ref 4.14–5.80)
RDW: 13.1 % (ref 11.6–15.4)
WBC: 7.6 x10E3/uL (ref 3.4–10.8)

## 2024-04-15 LAB — COMPREHENSIVE METABOLIC PANEL WITH GFR
ALT: 16 IU/L (ref 0–44)
AST: 16 IU/L (ref 0–40)
Albumin: 4.2 g/dL (ref 4.1–5.1)
Alkaline Phosphatase: 76 IU/L (ref 47–123)
BUN/Creatinine Ratio: 5 — ABNORMAL LOW (ref 9–20)
BUN: 7 mg/dL (ref 6–24)
Bilirubin Total: 1.2 mg/dL (ref 0.0–1.2)
CO2: 25 mmol/L (ref 20–29)
Calcium: 9.3 mg/dL (ref 8.7–10.2)
Chloride: 100 mmol/L (ref 96–106)
Creatinine, Ser: 1.36 mg/dL — ABNORMAL HIGH (ref 0.76–1.27)
Globulin, Total: 2.2 g/dL (ref 1.5–4.5)
Glucose: 117 mg/dL — ABNORMAL HIGH (ref 70–99)
Potassium: 3.9 mmol/L (ref 3.5–5.2)
Sodium: 139 mmol/L (ref 134–144)
Total Protein: 6.4 g/dL (ref 6.0–8.5)
eGFR: 65 mL/min/1.73 (ref >59)

## 2024-04-15 LAB — HEPATITIS B SURFACE ANTIBODY, QUANTITATIVE: Hepatitis B Surf Ab Quant: <3.5 m[IU]/mL — ABNORMAL LOW

## 2024-04-16 ENCOUNTER — Ambulatory Visit: Admitting: Family Medicine

## 2024-04-16 NOTE — Progress Notes (Signed)
 Called patient and left a message to call back to get scheduled. lab only visit in 1 month (order in)

## 2024-05-11 ENCOUNTER — Ambulatory Visit: Payer: PRIVATE HEALTH INSURANCE | Admitting: Urology

## 2024-05-11 NOTE — Progress Notes (Signed)
 Scheduled

## 2024-05-12 ENCOUNTER — Other Ambulatory Visit

## 2024-05-12 DIAGNOSIS — N289 Disorder of kidney and ureter, unspecified: Secondary | ICD-10-CM

## 2024-05-13 LAB — BASIC METABOLIC PANEL WITH GFR
BUN/Creatinine Ratio: 8 — ABNORMAL LOW (ref 9–20)
BUN: 9 mg/dL (ref 6–24)
CO2: 24 mmol/L (ref 20–29)
Calcium: 9.7 mg/dL (ref 8.7–10.2)
Chloride: 99 mmol/L (ref 96–106)
Creatinine, Ser: 1.2 mg/dL (ref 0.76–1.27)
Glucose: 137 mg/dL — ABNORMAL HIGH (ref 70–99)
Potassium: 3.7 mmol/L (ref 3.5–5.2)
Sodium: 138 mmol/L (ref 134–144)
eGFR: 75 mL/min/1.73

## 2024-05-17 ENCOUNTER — Other Ambulatory Visit

## 2024-05-17 ENCOUNTER — Ambulatory Visit: Payer: Self-pay | Admitting: Family Medicine

## 2024-07-13 ENCOUNTER — Ambulatory Visit: Admitting: Family Medicine
